# Patient Record
Sex: Female | Born: 1990 | Race: Black or African American | Hispanic: No | Marital: Single | Smoking: Current every day smoker
Health system: Southern US, Community
[De-identification: ages and names within clinical notes are randomized; demographics above are authoritative.]

## PROBLEM LIST (undated history)

## (undated) ENCOUNTER — Inpatient Hospital Stay (HOSPITAL_COMMUNITY): Payer: Self-pay

## (undated) DIAGNOSIS — O009 Unspecified ectopic pregnancy without intrauterine pregnancy: Secondary | ICD-10-CM

## (undated) DIAGNOSIS — N390 Urinary tract infection, site not specified: Secondary | ICD-10-CM

## (undated) DIAGNOSIS — R87610 Atypical squamous cells of undetermined significance on cytologic smear of cervix (ASC-US): Secondary | ICD-10-CM

## (undated) DIAGNOSIS — E669 Obesity, unspecified: Secondary | ICD-10-CM

## (undated) DIAGNOSIS — R87810 Cervical high risk human papillomavirus (HPV) DNA test positive: Secondary | ICD-10-CM

## (undated) DIAGNOSIS — A749 Chlamydial infection, unspecified: Secondary | ICD-10-CM

## (undated) HISTORY — PX: ECTOPIC PREGNANCY SURGERY: SHX613

## (undated) HISTORY — PX: LAPAROSCOPIC UNILATERAL SALPINGECTOMY: SHX5934

## (undated) HISTORY — DX: Unspecified ectopic pregnancy without intrauterine pregnancy: O00.90

## (undated) HISTORY — DX: Cervical high risk human papillomavirus (HPV) DNA test positive: R87.810

## (undated) HISTORY — DX: Atypical squamous cells of undetermined significance on cytologic smear of cervix (ASC-US): R87.610

---
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
## 2011-02-15 ENCOUNTER — Emergency Department (HOSPITAL_COMMUNITY)
Admission: EM | Admit: 2011-02-15 | Discharge: 2011-02-15 | Disposition: A | Discharge status: Home / Self Care | Payer: Self-pay | Attending: Emergency Medicine | Admitting: Emergency Medicine

## 2011-02-15 DIAGNOSIS — J3489 Other specified disorders of nose and nasal sinuses: Secondary | ICD-10-CM | POA: Insufficient documentation

## 2011-02-15 DIAGNOSIS — J069 Acute upper respiratory infection, unspecified: Secondary | ICD-10-CM | POA: Insufficient documentation

## 2011-02-15 DIAGNOSIS — N390 Urinary tract infection, site not specified: Secondary | ICD-10-CM | POA: Insufficient documentation

## 2011-02-15 DIAGNOSIS — R079 Chest pain, unspecified: Secondary | ICD-10-CM | POA: Insufficient documentation

## 2011-02-15 DIAGNOSIS — R1013 Epigastric pain: Secondary | ICD-10-CM | POA: Insufficient documentation

## 2011-02-15 DIAGNOSIS — R059 Cough, unspecified: Secondary | ICD-10-CM | POA: Insufficient documentation

## 2011-02-15 DIAGNOSIS — F341 Dysthymic disorder: Secondary | ICD-10-CM | POA: Insufficient documentation

## 2011-02-15 DIAGNOSIS — R110 Nausea: Secondary | ICD-10-CM | POA: Insufficient documentation

## 2011-02-15 DIAGNOSIS — R05 Cough: Secondary | ICD-10-CM | POA: Insufficient documentation

## 2011-02-15 DIAGNOSIS — K219 Gastro-esophageal reflux disease without esophagitis: Secondary | ICD-10-CM | POA: Insufficient documentation

## 2011-02-15 LAB — URINALYSIS, ROUTINE W REFLEX MICROSCOPIC
Glucose, UA: NEGATIVE mg/dL
Protein, ur: NEGATIVE mg/dL
Specific Gravity, Urine: 1.018 (ref 1.005–1.030)

## 2011-02-15 LAB — URINE MICROSCOPIC-ADD ON

## 2011-07-10 ENCOUNTER — Encounter (HOSPITAL_COMMUNITY): Payer: Self-pay | Admitting: Emergency Medicine

## 2011-07-10 ENCOUNTER — Emergency Department (INDEPENDENT_AMBULATORY_CARE_PROVIDER_SITE_OTHER)
Admission: EM | Admit: 2011-07-10 | Discharge: 2011-07-10 | Disposition: A | Discharge status: Home / Self Care | Payer: Medicaid Other | Source: Home / Self Care

## 2011-07-10 DIAGNOSIS — N390 Urinary tract infection, site not specified: Secondary | ICD-10-CM

## 2011-07-10 LAB — POCT URINALYSIS DIP (DEVICE)
Protein, ur: 100 mg/dL — AB
Specific Gravity, Urine: >=1.03 (ref 1.005–1.030)
Urobilinogen, UA: 0.2 mg/dL (ref 0.0–1.0)
pH: 5.5 (ref 5.0–8.0)

## 2011-07-10 MED ORDER — CEPHALEXIN 500 MG PO CAPS: 500.0000 mg | ORAL_CAPSULE | Freq: Two times a day (BID) | ORAL | Status: AC

## 2011-07-10 NOTE — ED Notes (Signed)
HERE WITH URINE FREQ/URGE,PRESSURE AND BLOOD TINGED URINE WITH WIPING X 1 WEEK.DENIES VAG D/C OR PAIN.NO FEVERS/N/V REPORTED WITH SX.LMP 06/21/11

## 2011-07-10 NOTE — ED Provider Notes (Signed)
History     CSN: [id number]  Arrival date & time 07/10/11  1478   None     Chief Complaint  Patient presents with  . Urinary Tract Infection    (Consider location/radiation/quality/duration/timing/severity/associated sxs/prior treatment) HPI Comments: Onset one week ago of urinary urgency, frequency, retention, and dysuria. She is beginning to notice blood tinges on the tissue with wiping after voiding. No fever, chills, abdominal or back pain. No previous hx of UTI.    History reviewed. No pertinent past medical history.  History reviewed. No pertinent past surgical history.  History reviewed. No pertinent family history.  History  Substance Use Topics  . Smoking status: Current Everyday Smoker  . Smokeless tobacco: Not on file  . Alcohol Use: No    OB History    Grav Para Term Preterm Abortions TAB SAB Ect Mult Living                  Review of Systems  Constitutional: Negative for fever and chills.  Gastrointestinal: Positive for abdominal pain.  Genitourinary: Positive for dysuria, urgency and frequency. Negative for flank pain, vaginal discharge and pelvic pain.  Musculoskeletal: Negative for back pain.    Allergies  Review of patient's allergies indicates no known allergies.  Home Medications   Current Outpatient Rx  Name Route Sig Dispense Refill  . CEPHALEXIN 500 MG PO CAPS Oral Take 1 capsule (500 mg total) by mouth 2 (two) times daily. 14 capsule 0    BP 119/86  Pulse 71  Temp(Src) 98.4 F (36.9 C) (Oral)  Resp 16  SpO2 97%  LMP 06/21/2011  Physical Exam  Nursing note and vitals reviewed. Constitutional: She appears well-developed and well-nourished. No distress.  HENT:  Head: Normocephalic and atraumatic.  Cardiovascular: Normal rate, regular rhythm and normal heart sounds.   Pulmonary/Chest: Effort normal and breath sounds normal.  Abdominal: Soft. Bowel sounds are normal. There is tenderness (mild) in the suprapubic area. There is no  guarding and no CVA tenderness.  Neurological: She is alert.  Skin: Skin is warm and dry.  Psychiatric: She has a normal mood and affect.    ED Course  Procedures (including critical care time)  Labs Reviewed  POCT URINALYSIS DIP (DEVICE) - Abnormal; Notable for the following:    Ketones, ur TRACE (*)    Hgb urine dipstick MODERATE (*)    Protein, ur 100 (*)    Nitrite POSITIVE (*)    Leukocytes, UA SMALL (*) Biochemical Testing Only. Please order routine urinalysis from main lab if confirmatory testing is needed.   All other components within normal limits  POCT PREGNANCY, URINE   No results found.   1. UTI (lower urinary tract infection)       MDM  Urine dip pos.         Melody Comas, [place] 07/10/11 1123

## 2011-07-17 NOTE — ED Provider Notes (Signed)
Medical screening examination/treatment/procedure(s) were performed by non-physician practitioner and as supervising physician I was immediately available for consultation/collaboration.  Leilyn Frayre G  D.O.    Randa Spike, MD 07/17/11 [phone]

## 2011-08-22 ENCOUNTER — Encounter (HOSPITAL_COMMUNITY): Payer: Self-pay | Admitting: *Deleted

## 2011-08-22 ENCOUNTER — Emergency Department (HOSPITAL_COMMUNITY)
Admission: EM | Admit: 2011-08-22 | Discharge: 2011-08-22 | Disposition: A | Discharge status: Home / Self Care | Payer: Self-pay | Source: Home / Self Care

## 2011-08-22 ENCOUNTER — Inpatient Hospital Stay (HOSPITAL_COMMUNITY)
Admission: AD | Admit: 2011-08-22 | Discharge: 2011-08-23 | Disposition: A | Discharge status: Home / Self Care | Payer: Self-pay | Source: Ambulatory Visit | Attending: Obstetrics & Gynecology | Admitting: Obstetrics & Gynecology

## 2011-08-22 DIAGNOSIS — R102 Pelvic and perineal pain: Secondary | ICD-10-CM

## 2011-08-22 DIAGNOSIS — N949 Unspecified condition associated with female genital organs and menstrual cycle: Secondary | ICD-10-CM

## 2011-08-22 DIAGNOSIS — O99891 Other specified diseases and conditions complicating pregnancy: Secondary | ICD-10-CM | POA: Insufficient documentation

## 2011-08-22 DIAGNOSIS — Z331 Pregnant state, incidental: Secondary | ICD-10-CM

## 2011-08-22 DIAGNOSIS — R109 Unspecified abdominal pain: Secondary | ICD-10-CM | POA: Insufficient documentation

## 2011-08-22 HISTORY — DX: Urinary tract infection, site not specified: N39.0

## 2011-08-22 LAB — POCT URINALYSIS DIP (DEVICE)
Glucose, UA: NEGATIVE mg/dL
Ketones, ur: NEGATIVE mg/dL
Protein, ur: NEGATIVE mg/dL

## 2011-08-22 LAB — CBC
HCT: 39.1 % (ref 36.0–46.0)
Hemoglobin: 12.8 g/dL (ref 12.0–15.0)
MCHC: 32.7 g/dL (ref 30.0–36.0)
MCV: 91.6 fL (ref 78.0–100.0)
WBC: 11.8 10*3/uL — ABNORMAL HIGH (ref 4.0–10.5)

## 2011-08-22 LAB — WET PREP, GENITAL
Trich, Wet Prep: NONE SEEN
Yeast Wet Prep HPF POC: NONE SEEN

## 2011-08-22 LAB — POCT PREGNANCY, URINE: Preg Test, Ur: POSITIVE — AB

## 2011-08-22 LAB — HCG, QUANTITATIVE, PREGNANCY: hCG, Beta Chain, Quant, S: 217 m[IU]/mL — ABNORMAL HIGH (ref <5)

## 2011-08-22 MED ORDER — METHYLPREDNISOLONE ACETATE 40 MG/ML IJ SUSP
INTRAMUSCULAR | Status: AC
  Filled 2011-08-22: qty 10

## 2011-08-22 NOTE — ED Provider Notes (Signed)
History     CSN: [id number]  Arrival date & time 08/22/11  1027   None     Chief Complaint  Patient presents with  . Abdominal Pain  . Vaginal Itching    (Consider location/radiation/quality/duration/timing/severity/associated sxs/prior treatment) HPI Comments: Patient presents today with complaints of intermittent pelvic pain for the last one week. She states the pain is admixture of crampy sharp discomfort and when occurs last for up to 5 minutes. She is approximately one week late for her menstrual period. She has had no vaginal bleeding or spotting. She denies any vaginal discharge with complaints of vaginal irritation. No current dysuria or urinary frequency. She was treated approximately one month ago for UTI. She has had 2 home pregnancy tests recently which were negative. She admits that she is trying to conceive.   Past Medical History  Diagnosis Date  . UTI (urinary tract infection)     History reviewed. No pertinent past surgical history.  History reviewed. No pertinent family history.  History  Substance Use Topics  . Smoking status: Current Everyday Smoker -- 1.0 packs/day  . Smokeless tobacco: Not on file  . Alcohol Use: No    OB History    Grav Para Term Preterm Abortions TAB SAB Ect Mult Living                  Review of Systems  Constitutional: Negative for fever and chills.  Gastrointestinal: Positive for nausea. Negative for vomiting and diarrhea.  Genitourinary: Positive for pelvic pain. Negative for dysuria, frequency and decreased urine volume.    Allergies  Other  Home Medications  No current outpatient prescriptions on file.  BP 114/64  Pulse 73  Temp(Src) 98.9 F (37.2 C) (Oral)  Resp 18  SpO2 100%  LMP 07/19/2011  Physical Exam  Nursing note and vitals reviewed. Constitutional: She appears well-developed and well-nourished. No distress.  Cardiovascular: Normal rate, regular rhythm and normal heart sounds.   Pulmonary/Chest:  Effort normal and breath sounds normal. No respiratory distress.  Abdominal: Soft. Bowel sounds are normal. She exhibits no distension and no mass. There is tenderness in the right lower quadrant and suprapubic area. There is no rigidity, no rebound and no guarding.  Genitourinary: Vagina normal and uterus normal. There is no rash, tenderness, lesion or injury on the right labia. There is no rash, tenderness, lesion or injury on the left labia. Cervix exhibits no motion tenderness, no discharge and no friability. Right adnexum displays tenderness. Right adnexum displays no mass and no fullness. Left adnexum displays no mass, no tenderness and no fullness.  Neurological: She is alert.  Skin: Skin is warm and dry.  Psychiatric: She has a normal mood and affect.    ED Course  Procedures (including critical care time)  Labs Reviewed  POCT URINALYSIS DIP (DEVICE) - Abnormal; Notable for the following:    Urobilinogen, UA 2.0 (*)    Leukocytes, UA LARGE (*) Biochemical Testing Only. Please order routine urinalysis from main lab if confirmatory testing is needed.   All other components within normal limits  GC/CHLAMYDIA PROBE AMP, GENITAL  WET PREP, GENITAL   No results found.   1. Pelvic pain   2. Pregnancy as incidental finding       MDM  Pt to go directly to [facility] ED for PUS - Rt adnexal pain w/ pos preg test.         Melody Comas, PA 08/22/11 2116

## 2011-08-22 NOTE — ED Notes (Signed)
Junious Dresser [facility] MAU RN, notified of pt hx.

## 2011-08-22 NOTE — MAU Note (Signed)
Pt states, " I have been having crampy pain on my left side then it would go away since March 10 th. I was seen at [facility] Urgent care and sent here for an [place]."

## 2011-08-22 NOTE — ED Provider Notes (Signed)
Medical screening examination/treatment/procedure(s) were performed by non-physician practitioner and as supervising physician I was immediately available for consultation/collaboration.  Leslee Home, M.D.   Reuben Likes, MD 08/22/11 2219

## 2011-08-22 NOTE — ED Notes (Addendum)
C/O pain across low abdominal pain and irritation in vaginal area without vaginal discharge.  Also c/o slight nausea and "feeling sleepy".  LMP 07/19/11 - states took home preg test with negative results.  Was treated approx 1 month ago for UTI - states sxs resolved.

## 2011-08-22 NOTE — Discharge Instructions (Signed)
Your urine pregnancy test was positive. Because you are having pelvic pain it is very important that you have a pelvic ultrasound this evening to make sure that you are not having complications related to the pregnancy, such as a tubal pregnancy. This can be life threatening. Go directly to [facility] ER for further evaluation. They have been notified and are expecting your arrival.

## 2011-08-23 ENCOUNTER — Encounter (HOSPITAL_COMMUNITY): Payer: Self-pay | Admitting: Radiology

## 2011-08-23 ENCOUNTER — Inpatient Hospital Stay (HOSPITAL_COMMUNITY): Payer: Self-pay

## 2011-08-23 NOTE — MAU Note (Signed)
Dr Adrian Blackwater in triage room with pt explaining results of BHCG and [place].

## 2011-08-23 NOTE — MAU Provider Note (Signed)
History     CSN: [id number]  Arrival date and time: 08/22/11 2139   None     Chief Complaint  Patient presents with  . Abdominal Pain  . Amenorrhea   HPI This is a 21 year old G3 P0 020 at 3 weeks and 4 days EGA and based on LMP of 07/29/2011 who was referred to the MAU from urgency care due to vague pelvic complaint. Her pelvic discomfort has been intermittent over the past 3 days. She had a positive urine pregnancy test at urgent care. She denies sharp pelvic pain, fevers, chills, nausea.    OB History    Grav Para Term Preterm Abortions TAB SAB Ect Mult Living   2               Past Medical History  Diagnosis Date  . UTI (urinary tract infection)   . Asthma   . No pertinent past medical history     Past Surgical History  Procedure Date  . No past surgeries     Family History  Problem Relation Age of Onset  . Anesthesia problems Neg Hx   . Hypotension Neg Hx   . Malignant hyperthermia Neg Hx   . Pseudochol deficiency Neg Hx     History  Substance Use Topics  . Smoking status: Current Everyday Smoker -- 1.0 packs/day  . Smokeless tobacco: Not on file  . Alcohol Use: No    Allergies:  Allergies  Allergen Reactions  . Other     Seafood    No prescriptions prior to admission    ROS Physical Exam   Blood pressure 120/67, pulse 86, temperature 99 F (37.2 C), temperature source Oral, resp. rate 18, height 5\' 5"  (1.651 m), weight 83.689 kg (184 lb 8 oz), last menstrual period 07/19/2011.  Physical Exam  Constitutional: She is oriented to person, place, and time. She appears well-developed and well-nourished.  Neurological: She is alert and oriented to person, place, and time.  Psychiatric: She has a normal mood and affect. Her behavior is normal. Judgment and thought content normal.   Results for orders placed during the hospital encounter of 08/22/11 (from the past 24 hour(s))  HCG, QUANTITATIVE, PREGNANCY     Status: Abnormal   Collection Time     08/22/11 10:10 PM      Component Value Range   hCG, Beta Chain, Quant, S 217 (*) <5 (mIU/mL)  CBC     Status: Abnormal   Collection Time   08/22/11 10:10 PM      Component Value Range   WBC 11.8 (*) 4.0 - 10.5 (K/uL)   RBC 4.27  3.87 - 5.11 (MIL/uL)   Hemoglobin 12.8  12.0 - 15.0 (g/dL)   HCT 40.9  81.1 - 91.4 (%)   MCV 91.6  78.0 - 100.0 (fL)   MCH 30.0  26.0 - 34.0 (pg)   MCHC 32.7  30.0 - 36.0 (g/dL)   RDW 78.2  95.6 - 21.3 (%)   Platelets 308  150 - 400 (K/uL)   Ultrasound shows no evidence of gestational sac or yolk sac either the uterus or the fallopian tubes.  MAU Course  Procedures   Assessment and Plan  #1 early intrauterine pregnancy  With beta-hCG level of 200, it may be truly to see gestational sac. Patient was counseled return in 2 days for repeat Quant. Patient was counseled with signs and symptoms of miscarriage and ectopic pregnancy and encourage her to return if either of these things this  happened.  Ernesha Ramone JEHIEL 08/23/2011, 1:33 AM

## 2011-08-24 LAB — URINE CULTURE

## 2011-08-25 ENCOUNTER — Encounter (HOSPITAL_COMMUNITY): Payer: Self-pay

## 2011-08-25 ENCOUNTER — Inpatient Hospital Stay (HOSPITAL_COMMUNITY)
Admission: AD | Admit: 2011-08-25 | Discharge: 2011-08-25 | Disposition: A | Discharge status: Home / Self Care | Payer: Medicaid Other | Source: Ambulatory Visit | Attending: Obstetrics & Gynecology | Admitting: Obstetrics & Gynecology

## 2011-08-25 DIAGNOSIS — O039 Complete or unspecified spontaneous abortion without complication: Secondary | ICD-10-CM | POA: Insufficient documentation

## 2011-08-25 LAB — ABO/RH: ABO/RH(D): A NEG

## 2011-08-25 MED ORDER — RHO D IMMUNE GLOBULIN 1500 UNIT/2ML IJ SOLN
300.0000 ug | Freq: Once | INTRAMUSCULAR | Status: AC
  Administered 2011-08-25: 300 ug via INTRAMUSCULAR

## 2011-08-25 NOTE — MAU Note (Signed)
Patient to MAU for follow up BHCG. Patient denies any pain but did have light brown spotting.

## 2011-08-25 NOTE — MAU Provider Note (Signed)
  History     CSN: [id number]  Arrival date and time: 08/25/11 1478   None     No chief complaint on file.  HPI 21 y.o. G2P0 at [redacted]w[redacted]d here for f/u quant. Presented on 3/15 with pain, no pain today. Does report brown spotting yesterday, none today. Quant HCG 212 on 3/15, no IUP or adnexal mass on u/s.    Past Medical History  Diagnosis Date  . UTI (urinary tract infection)   . Asthma   . No pertinent past medical history     Past Surgical History  Procedure Date  . No past surgeries     Family History  Problem Relation Age of Onset  . Anesthesia problems Neg Hx   . Hypotension Neg Hx   . Malignant hyperthermia Neg Hx   . Pseudochol deficiency Neg Hx     History  Substance Use Topics  . Smoking status: Current Everyday Smoker -- 1.0 packs/day  . Smokeless tobacco: Not on file  . Alcohol Use: No    Allergies:  Allergies  Allergen Reactions  . Other     Seafood    No prescriptions prior to admission    Review of Systems  Constitutional: Negative.   Respiratory: Negative.   Cardiovascular: Negative.   Gastrointestinal: Negative for nausea, vomiting, abdominal pain, diarrhea and constipation.  Genitourinary: Negative for dysuria, urgency, frequency, hematuria and flank pain.       Negative for vaginal bleeding, vaginal discharge  Musculoskeletal: Negative.   Neurological: Negative.   Psychiatric/Behavioral: Negative.    Physical Exam   Last menstrual period 07/19/2011.  Physical Exam  Nursing note and vitals reviewed. Constitutional: She is oriented to person, place, and time. She appears well-developed and well-nourished. No distress.  Cardiovascular: Normal rate.   Respiratory: Effort normal.  Musculoskeletal: Normal range of motion.  Neurological: She is alert and oriented to person, place, and time.  Skin: Skin is warm and dry.  Psychiatric: She has a normal mood and affect.    MAU Course  Procedures Results for orders placed during the  hospital encounter of 08/25/11 (from the past 24 hour(s))  HCG, QUANTITATIVE, PREGNANCY     Status: Abnormal   Collection Time   08/25/11  8:46 AM      Component Value Range   hCG, Beta Chain, Quant, S 162 (*) <5 (mIU/mL)  ABO/RH     Status: Normal   Collection Time   08/25/11  9:29 AM      Component Value Range   ABO/RH(D) A NEG    RH IG WORKUP     Status: Normal (Preliminary result)   Collection Time   08/25/11 10:44 AM      Component Value Range   Gestational Age(Wks) 5     ABO/RH(D) A NEG     Antibody Screen NEG     Unit Number [id number]     Blood Component Type RHIG     Unit division 00     Status of Unit ISSUED     Transfusion Status OK TO TRANSFUSE       Assessment and Plan  21 y.o. G2P0 at [redacted]w[redacted]d with decreasing quant HCG, likely SAB Rhogam given today Return to MAU 1 week for repeat quant  Sheehan Stacey 08/25/2011, 8:44 AM

## 2011-08-25 NOTE — Discharge Instructions (Signed)
Miscarriage (Spontaneous Miscarriage)  A miscarriage is when you lose your baby before the twentieth week of pregnancy. Miscarriages happen in 15-20% of pregnancies. Most miscarriages happen in the first 13 weeks of the pregnancy. In medical terms, this is called a spontaneous miscarriage or early pregnancy loss. No further treatment is needed when the miscarriage is complete and all products of conception have been passed out of the body. You can begin trying for another pregnancy as soon as your caregiver says it is okay.  CAUSES    Most causes are not known.   Genetic problems like abnormal, not enough or too many chromosomes.   Infection of the cervix or uterus.   An abnormal shaped uterus, fibroid tumors or congenital abnormalities.   Hormone problems.   Medical problems.   Incompetent cervix, the tissue in the cervix is not strong enough to hold the pregnancy.   Smoking, too much alcohol use and illegal drugs.   Trauma.  SYMPTOMS    Bleeding or spotting from the vagina.   Cramping of the lower abdomen.   Passing of fluid from the vagina with or without cramps or pain.   Passing fetal tissue.  TREATMENT    Sometimes no further treatment is necessary if you pass all the tissue in the uterus.   If partial parts of the fetus or placenta remain in the body (incomplete miscarriage), tissue left behind may become infected. Usually a D and C (Dilatation and Curettage) suction or scrapping of the uterus is necessary to remove the remaining tissue in uterus. The procedure is only done when your caregiver knows that there is no chance for the pregnancy to continue. This is determined by a physical exam, a negative pregnancy test, blood tests and perhaps an ultrasound revealing a dead fetus or no fetus developing because a problem occurred at conception (when the sperm and egg unite).   Medications may be necessary, antibiotics if there is an infection or medications to contract the uterus if there is a  lot of bleeding.   If you have Rh negative blood and your partner is Rh positive, you will need a Rho-gam shot (an immune globulin vaccine). This will protect your baby from having Rh blood problems in future pregnancies.  HOME CARE INSTRUCTIONS    Your caregiver may order bed rest (up to the bathroom only). He or she may allow you to continue light activity. You may need to make arrangements for the care of children and for any other responsibilities.   Keep track of the number of pads you use each day and how soaked (saturated) they are. Record this information.   Do not use tampons. Do not douche or have sexual intercourse until approved by your caregiver.   Only take over-the-counter or prescription medicines for pain, discomfort or fever as directed by your caregiver.   Do not take aspirin because it can cause bleeding.   It is very important to keep all follow-up appointments for re-evaluations and continuing management.   Tell your caregiver if you are experiencing domestic violence.   Women who have an Rh negative blood type (i.e., A, B, AB, or O negative) need to receive a drug called Rh(D) immune globulin (RhoGam). This medicine helps protect future fetuses against problems that can occur if an Rh negative mother is carrying a baby who is Rh positive.   If you and/or your partner are having problems with guilt or grieving, talk to your caregiver or seek counseling to help   you cope with the pregnancy loss. Allow enough time to grieve before trying to get pregnant again.  SEEK IMMEDIATE MEDICAL CARE IF:    You have severe cramps or pain in your stomach, back, or belly (abdomen).   You have a fever.   You pass large clots or tissue. Save any tissue for your caregiver to inspect.   Your bleeding increases.   You become light-headed, weak or have fainting episodes.   You develop chills.  Document Released: 11/19/2000 Document Revised: 05/15/2011 Document Reviewed: 12/27/2007  [employer] Patient  Information 2012 [employer].

## 2011-08-25 NOTE — ED Notes (Addendum)
GC/Chlamydia neg., Wet prep: few clue cells, mod. WBC's, Urine culture: >100,000 colonies Lactobacillus species. Lab shown to [employer] PA and she wrote -contaminant no treatment needed. Vassie Moselle 08/25/2011

## 2011-08-26 LAB — RH IG WORKUP (INCLUDES ABO/RH)
Antibody Screen: NEGATIVE
Gestational Age(Wks): 5

## 2011-09-03 ENCOUNTER — Inpatient Hospital Stay (HOSPITAL_COMMUNITY): Payer: Medicaid Other

## 2011-09-03 ENCOUNTER — Encounter (HOSPITAL_COMMUNITY): Payer: Self-pay | Admitting: *Deleted

## 2011-09-03 ENCOUNTER — Inpatient Hospital Stay (HOSPITAL_COMMUNITY)
Admission: AD | Admit: 2011-09-03 | Discharge: 2011-09-03 | Disposition: A | Discharge status: Home / Self Care | Payer: Medicaid Other | Source: Ambulatory Visit | Attending: Family Medicine | Admitting: Family Medicine

## 2011-09-03 DIAGNOSIS — O209 Hemorrhage in early pregnancy, unspecified: Secondary | ICD-10-CM | POA: Insufficient documentation

## 2011-09-03 NOTE — MAU Note (Signed)
Pt states, " I am here for. I'm not bleeding or having pain now."

## 2011-09-03 NOTE — MAU Note (Signed)
Terri Burrelson RNP in with pt to discuss labs and [place]

## 2011-09-03 NOTE — MAU Provider Note (Signed)
  History     CSN: [id number]  Arrival date and time: 09/03/11 1322   First Provider Initiated Contact with Patient 09/03/11 1516      Chief Complaint  Patient presents with  . Follow-up   HPI Cheyenne Walton 21 y.o. 6w 4d by LMP. Here for repeat quant.  Was seen on 08-22-11 and quant was 212.  Returned on 08-25-11 and quant was 162. Had rhogam on 08-25-11.  Was told this was likely a SAB and was to return in one week for quant.   OB History    Grav Para Term Preterm Abortions TAB SAB Ect Mult Living   1 0 0 0 0 0 0 0 0 0       Past Medical History  Diagnosis Date  . UTI (urinary tract infection)   . Asthma   . No pertinent past medical history     Past Surgical History  Procedure Date  . No past surgeries     Family History  Problem Relation Age of Onset  . Anesthesia problems Neg Hx   . Hypotension Neg Hx   . Malignant hyperthermia Neg Hx   . Pseudochol deficiency Neg Hx     History  Substance Use Topics  . Smoking status: Current Everyday Smoker -- 0.5 packs/day  . Smokeless tobacco: Never Used  . Alcohol Use: No    Allergies:  Allergies  Allergen Reactions  . Other     Seafood    No prescriptions prior to admission    Review of Systems  Gastrointestinal: Negative for abdominal pain.  Genitourinary:       No vaginal bleeding   Physical Exam   Blood pressure 119/68, pulse 100, temperature 99.9 F (37.7 C), temperature source Oral, resp. rate 20, height 5\' 5"  (1.651 m), weight 189 lb 2 oz (85.787 kg), last menstrual period 07/19/2011.  Physical Exam  Nursing note and vitals reviewed. Constitutional: She is oriented to person, place, and time. She appears well-developed and well-nourished.  HENT:  Head: Normocephalic.  Eyes: EOM are normal.  Neck: Neck supple.  Musculoskeletal: Normal range of motion.  Neurological: She is alert and oriented to person, place, and time.  Skin: Skin is warm and dry.  Psychiatric: She has a normal mood and  affect.    MAU Course  Procedures Results for orders placed during the hospital encounter of 09/03/11 (from the past 24 hour(s))  HCG, QUANTITATIVE, PREGNANCY     Status: Abnormal   Collection Time   09/03/11  2:10 PM      Component Value Range   hCG, Beta Chain, Quant, S 1180 (*) <5 (mIU/mL)   MDM  Consult with Dr. Shawnie Pons - reviewed plan of care. Will get ultrasound today as quant has increased.   Assessment and Plan  Rising quant. - suspect abnormal rise.  Plan Consult ultrasound results with Dr. Shawnie Pons and reviewed plan of care Reviewed ectopic precautions with client To return on Friday at 12 noon for repeat quant Return sooner for any abdominal pain or vaginal bleeding  Julius Boniface 09/03/2011, 4:43 PM

## 2011-09-04 NOTE — MAU Provider Note (Signed)
Chart reviewed and agree with management and plan.  

## 2011-09-05 ENCOUNTER — Inpatient Hospital Stay (HOSPITAL_COMMUNITY)
Admission: AD | Admit: 2011-09-05 | Discharge: 2011-09-05 | Disposition: A | Discharge status: Home / Self Care | Payer: Medicaid Other | Source: Ambulatory Visit | Attending: Obstetrics & Gynecology | Admitting: Obstetrics & Gynecology

## 2011-09-05 DIAGNOSIS — Z3200 Encounter for pregnancy test, result unknown: Secondary | ICD-10-CM

## 2011-09-05 DIAGNOSIS — O99891 Other specified diseases and conditions complicating pregnancy: Secondary | ICD-10-CM | POA: Insufficient documentation

## 2011-09-05 NOTE — Discharge Instructions (Signed)
Human Chorionic Gonadotropin (hCG) This is a test to confirm and monitor pregnancy or to diagnose trophoblastic disease or germ cell tumors. As early as 10 days after a missed menstrual period (some methods can detect hCG even earlier, at one week after conception) or if your caregiver thinks that your symptoms suggest ectopic pregnancy, a failing pregnancy, trophoblastic disease, or germ cell tumors. hCG is a protein produced in the placenta of a pregnant woman. A pregnancy test is a specific blood or urine test that can detect hCG and confirm pregnancy. This hormone is able to be detected 10 days after a missed menstrual period, the time period when the fertilized egg is implanted in the woman's uterus. With some methods, hCG can be detected even earlier, at one week after conception.  During the early weeks of pregnancy, hCG is important in maintaining function of the corpus luteum (the mass of cells that forms from a mature egg). Production of hCG increases steadily during the first trimester (8-10 weeks), peaking around the 10th week after the last menstrual cycle. Levels then fall slowly during the remainder of the pregnancy. hCG is no longer detectable within a few weeks after delivery. hCG is also produced by some germ cell tumors and increased levels are seen in trophoblastic disease. SAMPLE COLLECTION hCG is commonly detected in urine. The preferred specimen is a random urine sample collected first thing in the morning. hCG can also be measured in blood drawn from a vein in the arm. NORMAL FINDINGS Qualitative:negative in non-pregnant women; positive in pregnancy Quantitative:   Gestation less than 1 week: 5-50 Whole HCG (milli-international units/mL)   Gestation of 2 weeks: 50-500 Whole HCG (milli-international units/mL)   Gestation of 3 weeks: 100-10,000 Whole HCG (milli-international units/mL)   Gestation of 4 weeks: 1,000-30,000 Whole HCG (milli-international units/mL)   Gestation of  5 weeks 3,500-115,000 Whole HCG (milli-international units/mL)   Gestation of 6-8 weeks: 12,000-270,000 Whole HCG (milli-international units/mL)   Gestation of 12 weeks: 15,000-220,000 Whole HCG (milli-international units/mL)   Males and non-pregnant females: less than 5 Whole HCG (milli-international units/mL)  Beta subunit: depends on the method and test used Ranges for normal findings may vary among different laboratories and hospitals. You should always check with your doctor after having lab work or other tests done to discuss the meaning of your test results and whether your values are considered within normal limits. MEANING OF TEST  Your caregiver will go over the test results with you and discuss the importance and meaning of your results, as well as treatment options and the need for additional tests if necessary. OBTAINING THE TEST RESULTS It is your responsibility to obtain your test results. Ask the lab or department performing the test when and how you will get your results. Document Released: 06/27/2004 Document Revised: 05/15/2011 Document Reviewed: 05/06/2008 [facility] Patient Information 2012 [place], [place].

## 2011-09-05 NOTE — ED Notes (Signed)
Patient here for repeat BHCG having no pain

## 2011-09-05 NOTE — MAU Provider Note (Signed)
History   Chief Complaint:  No chief complaint on file.   Cheyenne Walton is  21 y.o. G1P0000 Patient's last menstrual period was 07/19/2011.Marland Kitchen Patient is here for follow up of quantitative HCG and ongoing surveillance of pregnancy status.     Since her last visit, the patient is without new complaint.   The patient reports bleeding as  none now.    General ROS:  negative  Her previous Quantitative HCG values are: 3/15: 212, 3/18: 162, 3/27: 1180    Physical Exam   Blood pressure 107/69, pulse 79, temperature 98.8 F (37.1 C), temperature source Oral, resp. rate 20, last menstrual period 07/19/2011.  Focused Gynecological Exam: examination not indicated  Labs: Recent Results (from the past 24 hour(s))  HCG, QUANTITATIVE, PREGNANCY   Collection Time   09/05/11 12:35 PM      Component Value Range   hCG, Beta Chain, Quant, S 1618 (*) <5 (mIU/mL)     Assessment: Positive Pregnancy test with 73% rise in 48 hours  Plan: Will continue to follow quants closely FU in MAU on Sunday for repeat quant, plan FU ultrasound in 1 week if continued adequate rise Precautions reviewed  Dmari Schubring E. 09/05/2011, 3:06 PM

## 2011-09-07 ENCOUNTER — Inpatient Hospital Stay (HOSPITAL_COMMUNITY): Payer: Medicaid Other

## 2011-09-07 ENCOUNTER — Inpatient Hospital Stay (HOSPITAL_COMMUNITY)
Admission: AD | Admit: 2011-09-07 | Discharge: 2011-09-07 | Disposition: A | Discharge status: Home / Self Care | Payer: Medicaid Other | Source: Ambulatory Visit | Attending: Obstetrics & Gynecology | Admitting: Obstetrics & Gynecology

## 2011-09-07 ENCOUNTER — Inpatient Hospital Stay (HOSPITAL_COMMUNITY): Admission: RE | Admit: 2011-09-07 | Payer: Self-pay | Source: Ambulatory Visit

## 2011-09-07 DIAGNOSIS — O00109 Unspecified tubal pregnancy without intrauterine pregnancy: Secondary | ICD-10-CM | POA: Insufficient documentation

## 2011-09-07 DIAGNOSIS — O009 Unspecified ectopic pregnancy without intrauterine pregnancy: Secondary | ICD-10-CM

## 2011-09-07 LAB — CBC
HCT: 37.6 % (ref 36.0–46.0)
Hemoglobin: 12.5 g/dL (ref 12.0–15.0)
MCH: 30.9 pg (ref 26.0–34.0)
MCV: 92.8 fL (ref 78.0–100.0)
RBC: 4.05 MIL/uL (ref 3.87–5.11)

## 2011-09-07 LAB — CREATININE, SERUM
GFR calc Af Amer: >90 mL/min (ref >90)
GFR calc non Af Amer: >90 mL/min (ref >90)

## 2011-09-07 LAB — DIFFERENTIAL
Eosinophils Absolute: 0.4 10*3/uL (ref 0.0–0.7)
Eosinophils Relative: 5 % (ref 0–5)
Lymphocytes Relative: 28 % (ref 12–46)
Lymphs Abs: 2.2 10*3/uL (ref 0.7–4.0)
Monocytes Absolute: 0.6 10*3/uL (ref 0.1–1.0)
Monocytes Relative: 8 % (ref 3–12)

## 2011-09-07 LAB — HCG, QUANTITATIVE, PREGNANCY: hCG, Beta Chain, Quant, S: 1939 m[IU]/mL — ABNORMAL HIGH (ref <5)

## 2011-09-07 MED ORDER — METHOTREXATE INJECTION FOR WOMEN'S HOSPITAL
50.0000 mg/m2 | Freq: Once | INTRAMUSCULAR | Status: AC
  Administered 2011-09-07: 100 mg via INTRAMUSCULAR
  Filled 2011-09-07: qty 2

## 2011-09-07 NOTE — MAU Provider Note (Signed)
History     CSN: [id number]  Arrival date and time: 09/07/11 0943   None     Chief Complaint  Patient presents with  . Follow-up   HPI Cheyenne Walton 21 y.o. 7w 1d by LMP returns for BHCG followup.  Inadequately rising quants.  Previous quants = 3/15: 212, 3/18: 162, 3/27: 1180, 3/29: 1618.  No pain.  No bleeding.   OB History    Grav Para Term Preterm Abortions TAB SAB Ect Mult Living   1 0 0 0 0 0 0 0 0 0       Past Medical History  Diagnosis Date  . UTI (urinary tract infection)   . Asthma   . No pertinent past medical history     Past Surgical History  Procedure Date  . No past surgeries     Family History  Problem Relation Age of Onset  . Anesthesia problems Neg Hx   . Hypotension Neg Hx   . Malignant hyperthermia Neg Hx   . Pseudochol deficiency Neg Hx     History  Substance Use Topics  . Smoking status: Current Everyday Smoker -- 0.5 packs/day  . Smokeless tobacco: Never Used  . Alcohol Use: No    Allergies:  Allergies  Allergen Reactions  . Other Anaphylaxis, Rash and Other (See Comments)    Seafood - throat swelling    Prescriptions prior to admission  Medication Sig Dispense Refill  . acetaminophen (TYLENOL) 500 MG tablet Take 1,000 mg by mouth daily as needed. For pain        Review of Systems  Gastrointestinal: Negative for abdominal pain.  Genitourinary:       No vaginal bleeding   Physical Exam   Blood pressure 115/49, pulse 87, temperature 98 F (36.7 C), temperature source Oral, resp. rate 18, last menstrual period 07/19/2011.  Physical Exam  Nursing note and vitals reviewed. Constitutional: She is oriented to person, place, and time. She appears well-developed and well-nourished.  HENT:  Head: Normocephalic.  Eyes: EOM are normal.  Neck: Neck supple.  Musculoskeletal: Normal range of motion.  Neurological: She is alert and oriented to person, place, and time.  Skin: Skin is warm and dry.  Psychiatric:       Upset  and crying re: news of ectopic pregnancy    MAU Course  Procedures Results for orders placed during the hospital encounter of 09/07/11 (from the past 24 hour(s))  HCG, QUANTITATIVE, PREGNANCY     Status: Abnormal   Collection Time   09/07/11  9:52 AM      Component Value Range   hCG, Beta Chain, Quant, S 1939 (*) <5 (mIU/mL)  CBC     Status: Normal   Collection Time   09/07/11  9:52 AM      Component Value Range   WBC 7.7  4.0 - 10.5 (K/uL)   RBC 4.05  3.87 - 5.11 (MIL/uL)   Hemoglobin 12.5  12.0 - 15.0 (g/dL)   HCT 40.9  81.1 - 91.4 (%)   MCV 92.8  78.0 - 100.0 (fL)   MCH 30.9  26.0 - 34.0 (pg)   MCHC 33.2  30.0 - 36.0 (g/dL)   RDW 78.2  95.6 - 21.3 (%)   Platelets 278  150 - 400 (K/uL)  DIFFERENTIAL     Status: Normal   Collection Time   09/07/11  9:52 AM      Component Value Range   Neutrophils Relative 58  43 - 77 (%)  Neutro Abs 4.5  1.7 - 7.7 (K/uL)   Lymphocytes Relative 28  12 - 46 (%)   Lymphs Abs 2.2  0.7 - 4.0 (K/uL)   Monocytes Relative 8  3 - 12 (%)   Monocytes Absolute 0.6  0.1 - 1.0 (K/uL)   Eosinophils Relative 5  0 - 5 (%)   Eosinophils Absolute 0.4  0.0 - 0.7 (K/uL)   Basophils Relative 0  0 - 1 (%)   Basophils Absolute 0.0  0.0 - 0.1 (K/uL)  AST     Status: Normal   Collection Time   09/07/11  9:52 AM      Component Value Range   AST 14  0 - 37 (U/L)  BUN     Status: Normal   Collection Time   09/07/11  9:52 AM      Component Value Range   BUN 9  6 - 23 (mg/dL)  CREATININE, SERUM     Status: Normal   Collection Time   09/07/11  9:52 AM      Component Value Range   Creatinine, Ser 0.70  0.50 - 1.10 (mg/dL)   GFR calc non Af Amer >90  >90 (mL/min)   GFR calc Af Amer >90  >90 (mL/min)   MDM Clinical Data: Abnormal rising Beta HCG.  TRANSVAGINAL OB ULTRASOUND  Technique: Transvaginal ultrasound was performed for evaluation of  the gestation as well as the maternal uterus and adnexal regions.  Comparison: None  Findings: There is no intrauterine  gestational sac.  Maternal uterus / adnexa:  The right ovary appears normal.  Simple-appearing paraovarian cyst on the left measures 1.4 x 1.0  cm.  Adjacent to the left ovary there is a solid mass which measures 1.7  x 1.0 x 1.3 cm. No documented blood flow within this mass is  identified.  There is no free fluid within the pelvis.  IMPRESSION:  1. There is no evidence for intrauterine pregnancy.  2. Solid left adnexal mass is indeterminate. In the setting of a  positive pregnancy test I cannot exclude an ectopic pregnancy.,  this will need close clinical follow-up with serial Beta HCG and  pelvic sonography.  3. No significant free fluid within the pelvis  Critical Value/emergent results were called by telephone at the  time of interpretation on 09/07/2011 at 12:11 p.m. to Cheyenne Walton, who verbally acknowledged these results.  1215  Consult with Dr. Marice Walton 1218  Discussed lab and ultrasound results with client and her mother.  Client very upset.  This is a wanted pregnancy.  Discussed surgery and methotrexate as options for management of ectopic pregnancy.  Client elects methotrexate.  Ectopic pregnancy information sheet given.  Rhogam given on 08-25-11  Assessment and Plan  Ectopic pregnancy with methotrexate  Plan Return on day 4 and day 7 for repeat quant Return sooner for any pain or bleeding Pelvic rest Ectopic precautions  Cheyenne Walton 09/07/2011, 12:26 PM

## 2011-09-07 NOTE — ED Notes (Signed)
MTX given. Pt instructed in S/S and when to return for f/u labe. Pt and mother verbalized understanding.

## 2011-09-07 NOTE — ED Notes (Signed)
NP discussed U/S finding of ectopic pregnancy with pt and mother. Both very upset and crying. Gave them some time to grieve . MTX therapy explained and pt verbalized understansding.

## 2011-09-07 NOTE — MAU Note (Signed)
Pt here for f/u BHCG. Denies pain or vag bleeding. 

## 2011-09-07 NOTE — Discharge Instructions (Signed)
Return on Wednesday and Saturday morning between 8 am and 12 noon for repeat lab work.  We expect your hormone level to be going down by Saturday.  Sometimes a second shot is needed.  We will be evaluating your to see if that is needed. Do not put anything in the vagina - no tampons, no douching. Do not have sexual intercourse. Do not lift more than 5 pounds. Return if you develop sudden, worse abdominal pain. Return if you have heavy vaginal bleeding.

## 2011-09-10 ENCOUNTER — Inpatient Hospital Stay (HOSPITAL_COMMUNITY)
Admission: AD | Admit: 2011-09-10 | Discharge: 2011-09-10 | Disposition: A | Discharge status: Home / Self Care | Payer: Medicaid Other | Source: Ambulatory Visit | Attending: Obstetrics & Gynecology | Admitting: Obstetrics & Gynecology

## 2011-09-10 DIAGNOSIS — O00109 Unspecified tubal pregnancy without intrauterine pregnancy: Secondary | ICD-10-CM | POA: Insufficient documentation

## 2011-09-10 LAB — HCG, QUANTITATIVE, PREGNANCY: hCG, Beta Chain, Quant, S: 2293 m[IU]/mL — ABNORMAL HIGH (ref <5)

## 2011-09-10 NOTE — MAU Provider Note (Signed)
  History     CSN: [id number]  Arrival date and time: 09/10/11 1914   First Provider Initiated Contact with Patient 09/10/11 0920      Chief Complaint  Patient presents with  . Labs Only   HPI Cheyenne Walton 21 y.o. Comes to MAU for follow up quant.  Today is day 4 after methotrexate for ectopic pregnancy given on 09-07-11.  Quants had been 09-05-11 - 1618; 09-07-11 - 1939 (MTX given).  Having no pain or bleeding today.     OB History    Grav Para Term Preterm Abortions TAB SAB Ect Mult Living   1 0 0 0 0 0 0 0 0 0       Past Medical History  Diagnosis Date  . UTI (urinary tract infection)   . Asthma   . No pertinent past medical history     Past Surgical History  Procedure Date  . No past surgeries     Family History  Problem Relation Age of Onset  . Anesthesia problems Neg Hx   . Hypotension Neg Hx   . Malignant hyperthermia Neg Hx   . Pseudochol deficiency Neg Hx     History  Substance Use Topics  . Smoking status: Current Everyday Smoker -- 0.5 packs/day  . Smokeless tobacco: Never Used  . Alcohol Use: No    Allergies:  Allergies  Allergen Reactions  . Other Anaphylaxis, Rash and Other (See Comments)    Seafood - throat swelling    Prescriptions prior to admission  Medication Sig Dispense Refill  . acetaminophen (TYLENOL) 500 MG tablet Take 1,000 mg by mouth daily as needed. For pain        ROS Physical Exam   Last menstrual period 07/19/2011.  Physical Exam  Nursing note and vitals reviewed. Constitutional: She is oriented to person, place, and time. She appears well-developed and well-nourished.  HENT:  Head: Normocephalic.  Eyes: EOM are normal.  Musculoskeletal: Normal range of motion.  Neurological: She is alert and oriented to person, place, and time.  Skin: Skin is warm and dry.  Psychiatric: She has a normal mood and affect.    MAU Course  Procedures Reviewed plan with client and her mother.  Answered their questions.   Reviewed  results with client.  Agreed to return on Saturday for repeat labs.  MDM Results for orders placed during the hospital encounter of 09/10/11 (from the past 24 hour(s))  HCG, QUANTITATIVE, PREGNANCY     Status: Abnormal   Collection Time   09/10/11  9:10 AM      Component Value Range   hCG, Beta Chain, Quant, S 2293 (*) <5 (mIU/mL)     Assessment and Plan  Ectopic pregnancy Day 4 - quant 2293 - rise over quant on day 1 of methotrexate  Plan Return on Saturday for repeat quant Return sooner if having pain or bleeding.  Cheyenne Walton 09/10/2011, 11:11 AM

## 2011-09-10 NOTE — MAU Note (Signed)
Pt here for repeat bhcg levels post methotrexate. Denies pain or bleeding.

## 2011-09-13 ENCOUNTER — Inpatient Hospital Stay (HOSPITAL_COMMUNITY): Payer: Medicaid Other

## 2011-09-13 ENCOUNTER — Encounter (HOSPITAL_COMMUNITY): Payer: Self-pay

## 2011-09-13 ENCOUNTER — Encounter (HOSPITAL_COMMUNITY)
Admission: AD | Disposition: A | Discharge status: Home / Self Care | Payer: Self-pay | Source: Ambulatory Visit | Attending: Obstetrics & Gynecology

## 2011-09-13 ENCOUNTER — Ambulatory Visit (HOSPITAL_COMMUNITY)
Admission: AD | Admit: 2011-09-13 | Discharge: 2011-09-13 | Disposition: A | Discharge status: Home / Self Care | Payer: Medicaid Other | Source: Ambulatory Visit | Attending: Obstetrics & Gynecology | Admitting: Obstetrics & Gynecology

## 2011-09-13 DIAGNOSIS — O00109 Unspecified tubal pregnancy without intrauterine pregnancy: Principal | ICD-10-CM | POA: Insufficient documentation

## 2011-09-13 DIAGNOSIS — O009 Unspecified ectopic pregnancy without intrauterine pregnancy: Secondary | ICD-10-CM | POA: Diagnosis present

## 2011-09-13 HISTORY — PX: LAPAROSCOPY: SHX197

## 2011-09-13 LAB — DIFFERENTIAL
Basophils Absolute: 0 10*3/uL (ref 0.0–0.1)
Basophils Relative: 0 % (ref 0–1)
Eosinophils Relative: 5 % (ref 0–5)
Lymphocytes Relative: 35 % (ref 12–46)
Monocytes Absolute: 0.8 10*3/uL (ref 0.1–1.0)
Monocytes Relative: 8 % (ref 3–12)
Neutro Abs: 5.6 10*3/uL (ref 1.7–7.7)

## 2011-09-13 LAB — AST: AST: 16 U/L (ref 0–37)

## 2011-09-13 LAB — CBC
HCT: 39.9 % (ref 36.0–46.0)
Hemoglobin: 13.1 g/dL (ref 12.0–15.0)
MCHC: 32.8 g/dL (ref 30.0–36.0)
MCV: 93.4 fL (ref 78.0–100.0)
RDW: 13.5 % (ref 11.5–15.5)

## 2011-09-13 LAB — BUN: BUN: 6 mg/dL (ref 6–23)

## 2011-09-13 SURGERY — LAPAROSCOPY OPERATIVE
Anesthesia: General | Site: Abdomen | Laterality: Left | Wound class: Clean Contaminated

## 2011-09-13 MED ORDER — METHOTREXATE INJECTION FOR WOMEN'S HOSPITAL: 50.0000 mg/m2 | Freq: Once | INTRAMUSCULAR | Status: DC

## 2011-09-13 MED ORDER — MEPERIDINE HCL 25 MG/ML IJ SOLN: 6.2500 mg | INTRAMUSCULAR | Status: DC | PRN

## 2011-09-13 MED ORDER — DOCUSATE SODIUM 100 MG PO CAPS: 100.0000 mg | ORAL_CAPSULE | Freq: Two times a day (BID) | ORAL | Status: AC | PRN

## 2011-09-13 MED ORDER — SODIUM CHLORIDE 0.9 % IR SOLN
Status: DC | PRN
  Administered 2011-09-13: 1000 mL

## 2011-09-13 MED ORDER — LACTATED RINGERS IV SOLN
INTRAVENOUS | Status: DC
  Administered 2011-09-13: 13:00:00 via INTRAVENOUS

## 2011-09-13 MED ORDER — SUCCINYLCHOLINE CHLORIDE 20 MG/ML IJ SOLN
INTRAMUSCULAR | Status: DC | PRN
  Administered 2011-09-13: 140 mg via INTRAVENOUS

## 2011-09-13 MED ORDER — LIDOCAINE HCL (CARDIAC) 20 MG/ML IV SOLN
INTRAVENOUS | Status: DC | PRN
  Administered 2011-09-13: 20 mg via INTRAVENOUS

## 2011-09-13 MED ORDER — METOCLOPRAMIDE HCL 5 MG/ML IJ SOLN: 10.0000 mg | Freq: Once | INTRAMUSCULAR | Status: DC | PRN

## 2011-09-13 MED ORDER — FENTANYL CITRATE 0.05 MG/ML IJ SOLN: 25.0000 ug | INTRAMUSCULAR | Status: DC | PRN

## 2011-09-13 MED ORDER — ROCURONIUM BROMIDE 100 MG/10ML IV SOLN
INTRAVENOUS | Status: DC | PRN
  Administered 2011-09-13: 25 mg via INTRAVENOUS

## 2011-09-13 MED ORDER — KETOROLAC TROMETHAMINE 30 MG/ML IJ SOLN
INTRAMUSCULAR | Status: DC | PRN
  Administered 2011-09-13: 30 mg via INTRAVENOUS

## 2011-09-13 MED ORDER — LACTATED RINGERS IV SOLN
INTRAVENOUS | Status: DC | PRN
  Administered 2011-09-13 (×2): via INTRAVENOUS

## 2011-09-13 MED ORDER — OXYCODONE-ACETAMINOPHEN 5-325 MG PO TABS: 1.0000 | ORAL_TABLET | Freq: Four times a day (QID) | ORAL | Status: AC | PRN

## 2011-09-13 MED ORDER — FENTANYL CITRATE 0.05 MG/ML IJ SOLN
INTRAMUSCULAR | Status: DC | PRN
  Administered 2011-09-13: 50 ug via INTRAVENOUS
  Administered 2011-09-13: 100 ug via INTRAVENOUS
  Administered 2011-09-13: 50 ug via INTRAVENOUS
  Administered 2011-09-13: 100 ug via INTRAVENOUS
  Administered 2011-09-13: 50 ug via INTRAVENOUS

## 2011-09-13 MED ORDER — BUPIVACAINE HCL (PF) 0.25 % IJ SOLN
INTRAMUSCULAR | Status: DC | PRN
  Administered 2011-09-13: 10 mL

## 2011-09-13 MED ORDER — IBUPROFEN 600 MG PO TABS: 600.0000 mg | ORAL_TABLET | Freq: Four times a day (QID) | ORAL | Status: AC | PRN

## 2011-09-13 MED ORDER — CEFAZOLIN SODIUM 1-5 GM-% IV SOLN
1.0000 g | Freq: Once | INTRAVENOUS | Status: AC
  Administered 2011-09-13: 1 g via INTRAVENOUS
  Filled 2011-09-13: qty 50

## 2011-09-13 MED ORDER — ONDANSETRON HCL 4 MG/2ML IJ SOLN
INTRAMUSCULAR | Status: DC | PRN
  Administered 2011-09-13: 4 mg via INTRAVENOUS

## 2011-09-13 MED ORDER — GLYCOPYRROLATE 0.2 MG/ML IJ SOLN
INTRAMUSCULAR | Status: DC | PRN
  Administered 2011-09-13: 0.2 mg via INTRAVENOUS
  Administered 2011-09-13: 1 mg via INTRAVENOUS

## 2011-09-13 MED ORDER — NEOSTIGMINE METHYLSULFATE 1 MG/ML IJ SOLN
INTRAMUSCULAR | Status: DC | PRN
  Administered 2011-09-13: 5 mg via INTRAVENOUS

## 2011-09-13 MED ORDER — PROPOFOL 10 MG/ML IV EMUL
INTRAVENOUS | Status: DC | PRN
  Administered 2011-09-13: 200 mg via INTRAVENOUS

## 2011-09-13 MED ORDER — DEXAMETHASONE SODIUM PHOSPHATE 4 MG/ML IJ SOLN
INTRAMUSCULAR | Status: DC | PRN
  Administered 2011-09-13: 10 mg via INTRAVENOUS

## 2011-09-13 MED ORDER — MIDAZOLAM HCL 5 MG/5ML IJ SOLN
INTRAMUSCULAR | Status: DC | PRN
  Administered 2011-09-13: 2 mg via INTRAVENOUS

## 2011-09-13 SURGICAL SUPPLY — 32 items
CABLE HIGH FREQUENCY MONO STRZ (ELECTRODE) IMPLANT
CHLORAPREP W/TINT 26ML (MISCELLANEOUS) ×2 IMPLANT
CLOTH BEACON ORANGE TIMEOUT ST (SAFETY) ×2 IMPLANT
DERMABOND ADHESIVE PROPEN (GAUZE/BANDAGES/DRESSINGS) ×1
DERMABOND ADVANCED .7 DNX6 (GAUZE/BANDAGES/DRESSINGS) ×1 IMPLANT
DRSG COVADERM PLUS 2X2 (GAUZE/BANDAGES/DRESSINGS) ×2 IMPLANT
GLOVE BIO SURGEON STRL SZ7 (GLOVE) IMPLANT
GLOVE BIOGEL PI IND STRL 7.0 (GLOVE) ×2 IMPLANT
GLOVE BIOGEL PI INDICATOR 7.0 (GLOVE) ×2
GLOVE SKINSENSE NS SZ6.5 (GLOVE) ×2
GLOVE SKINSENSE NS SZ7.0 (GLOVE) ×1
GLOVE SKINSENSE STRL SZ6.5 (GLOVE) ×2 IMPLANT
GLOVE SKINSENSE STRL SZ7.0 (GLOVE) ×1 IMPLANT
GOWN PREVENTION PLUS LG XLONG (DISPOSABLE) ×4 IMPLANT
NEEDLE GYRUS 33CM (NEEDLE) ×2 IMPLANT
NEEDLE INSUFFLATION 14GA 120MM (NEEDLE) ×2 IMPLANT
NS IRRIG 1000ML POUR BTL (IV SOLUTION) ×2 IMPLANT
PACK LAPAROSCOPY BASIN (CUSTOM PROCEDURE TRAY) ×2 IMPLANT
POUCH SPECIMEN RETRIEVAL 10MM (ENDOMECHANICALS) ×2 IMPLANT
PROTECTOR NERVE ULNAR (MISCELLANEOUS) ×2 IMPLANT
SEALER TISSUE G2 CVD JAW 35 (ENDOMECHANICALS) IMPLANT
SEALER TISSUE G2 CVD JAW 45CM (ENDOMECHANICALS) IMPLANT
SET IRRIG TUBING LAPAROSCOPIC (IRRIGATION / IRRIGATOR) IMPLANT
SUT VIC AB 3-0 X1 27 (SUTURE) IMPLANT
SUT VICRYL 0 UR6 27IN ABS (SUTURE) ×4 IMPLANT
SUT VICRYL 4-0 PS2 18IN ABS (SUTURE) ×2 IMPLANT
TOWEL OR 17X24 6PK STRL BLUE (TOWEL DISPOSABLE) ×4 IMPLANT
TRAY FOLEY CATH 14FR (SET/KITS/TRAYS/PACK) ×2 IMPLANT
TROCAR Z-THREAD BLADED 11X100M (TROCAR) ×2 IMPLANT
TROCAR Z-THREAD BLADED 5X100MM (TROCAR) ×4 IMPLANT
WARMER LAPAROSCOPE (MISCELLANEOUS) ×2 IMPLANT
WATER STERILE IRR 1000ML POUR (IV SOLUTION) ×2 IMPLANT

## 2011-09-13 NOTE — Discharge Instructions (Signed)
Salpingectomy Salpingectomy, also called tubectomy, is the removal of one of the fallopian tubes with surgery. The fallopian tubes are the tubes that are connected to the uterus. These tubes transport the egg from the ovary to the womb (uterus). Removing one tube does not prevent you from becoming pregnant. Salpingectomy does not have any adverse affects on your menstrual periods. There are many reasons to have a salpingectomy, such as if:  You have a tubal (ectopic) pregnancy. This is especially true if the tube ruptures.   You have an infected tube.   It is necessary to remove the tube when removing an ovary with a cyst or tumor.   The uterus needs to be removed.   You need surgery for cancer of the tube or other female organs.  RISKS AND COMPLICATIONS   Injury to surrounding organs.   Bleeding.   Infection.   The surgery does not correct the problem.   You have problems with the anesthesia.  PROCEDURE  You will be given an IV (intravenous) and a medication to relax you. Then, you will be put to sleep with a drug (anesthetic). Any hair on your lower belly (abdomen) will be removed and a catheter will be placed in your bladder. Through 2 very small cuts (incisions) if you have a laparoscopy, or a large incision in the lower abdomen, the fallopian tube will be removed from where it attaches to the uterus. The blood vessels will be clamped and tied.  AFTER THE PROCEDURE   You will be taken to the recovery room and observed for 1 to 2 hours.   If you had a laparoscopy, you may be discharged to home   If you had a laparoscopy, you may have shoulder pain. This is not unusual and is from some air that is left in the abdomen. This air affects the nerve that goes from the diaphragm to the shoulder. It goes away in a day or two.   You will be given pain medication if needed.   The intravenous and catheter will be removed before you are discharged.   Have someone available to take you  home.    Document Released: 10/12/2008 Document Revised: 05/15/2011 Document Reviewed: 10/12/2008 [facility] Patient Information 2012 [place], [place].

## 2011-09-13 NOTE — MAU Provider Note (Signed)
Attestation of Attending Supervision of Advanced Practitioner: Evaluation and management procedures were performed by the PA/NP/CNM/OB Fellow under my supervision/collaboration. Chart reviewed, and agree with management and plan.  Please see H&P for further details about this admission for surgery.  Jaynie Collins, M.D. 09/13/2011 1:06 PM

## 2011-09-13 NOTE — MAU Provider Note (Signed)
Cheyenne Walton is a 21 y.o. female who presents to MAU for follow up Bhcg s/p MTX for ectopic pregnancy. Patient reports increased abdominal pain and a brown vaginal discharge. On 3/29 Bhcg was 1618 and on 3/31 was 1939 and patient received MTX for ectopic pregnancy. Day 4 after MTX Bhcg was 2293 and today, day 7 it is 2130.  VS reviewed and are normal.  Discussed results with patient and since she has increased pain will repeat the ultrasound today.  US Ob Transvaginal  09/13/2011  *RADIOLOGY REPORT*  Clinical Data: 21 year old female status post methotrexate treatment.  Quantitative beta HCG slightly lower, reported today at 2198.  TRANSVAGINAL OBSTETRIC US  Technique:  Transvaginal ultrasound was performed for complete evaluation of the gestation as well as the maternal uterus, adnexal regions, and pelvic cul-de-sac.  Comparison:  09/07/2011 and earlier.  Intrauterine gestational sac: None Yolk sac: None Embryo: None Cardiac Activity: None Subchorionic hemorrhage: None  Maternal uterus/adnexae: Trace pelvic free fluid appears fairly simple (image 37).  The left adnexa today measures 3.6 x 2.4 x 1.8 cm (previously 4.2 x 2.1 x 2.3 cm).  Adjacent to the left adnexa at best seen on image 31 there is a slightly hyperechoic mass which today measures 1.6 x 1.7 x 1.3 cm (previously 1.7 x 1.1 x 1.3 cm).  There is mild associated vascularity on power Doppler (image 32).  A small simple appearing left adnexal cyst is also present measuring 13 mm in diameter.  The right ovary appears normal with multiple small follicles measuring 3.9 x 1.4 x 2.8 cm.  IMPRESSION: 1.  As before, no IUP identified. 2.  Solid slightly vascular mass re-identified adjacent to the left ovary is stable in size, 1.6 x 1.7 x 1.3 cm.  This remains indeterminate, but suspicious for ectopic pregnancy in this setting. 3.  Trace pelvic free fluid appears fairly simple.  The right ovary remains within normal limits. 4.  Recommend continued serial  quantitative beta HCG and imaging follow-up as necessary.  Original Report Authenticated By: Harley Hallmark, M.D.   Discussed results with Dr. Macon Large and will administer patient second MTX.  Patient does not want a second MTX and request surgery. Dr. Macon Large in to discuss surgery with the patient.

## 2011-09-13 NOTE — H&P (Signed)
Preoperative History and Physical  Shamel Galyean is a 21 y.o. G1P0 at [redacted]w[redacted]d with known left ectopic pregnancy s/p failed MTX treatment who now desires surgical management.  Patient reports increased abdominal pain and brown vaginal discharge. On 3/29 BHCG was 1618 and on 3/31 was 1939 and patient received MTX for ectopic pregnancy. Day 4 after MTX Bhcg was 2293 and today, day 7 it is 2130.  Patient was offered 2nd MTX dose, but she declines this and wants surgery.    Proposed surgery: Laparoscopic left salpingectomy and removal of ectopic pregnancy  PMH:    Past Medical History  Diagnosis Date  . UTI (urinary tract infection)   . Asthma   . No pertinent past medical history     PSH:     Past Surgical History  Procedure Date  . No past surgeries     POb/GynH:   OB History    Grav Para Term Preterm Abortions TAB SAB Ect Mult Living   1 0 0 0 0 0 0 0 0 0      Medications: Current facility-administered medications:ceFAZolin (ANCEF) IVPB 1 g/50 mL premix, 1 g, Intravenous, Once, Tereso Newcomer, MD;  DISCONTD: methotrexate chemo injection 50 mg/m2, 50 mg/m2, Intramuscular, Once, [facility] Orlene Och, NP  Allergies:  Allergies  Allergen Reactions  . Contrast Media (Iodinated Diagnostic Agents) Anaphylaxis  . Latex Hives  . Other Anaphylaxis, Rash and Other (See Comments)    Seafood - throat swelling    SH:   History  Substance Use Topics  . Smoking status: Current Everyday Smoker -- 0.5 packs/day  . Smokeless tobacco: Never Used  . Alcohol Use: No    FH:    Family History  Problem Relation Age of Onset  . Anesthesia problems Neg Hx   . Hypotension Neg Hx   . Malignant hyperthermia Neg Hx   . Pseudochol deficiency Neg Hx     Review of Systems: Noncontributory  PHYSICAL EXAM: Blood pressure 110/64, pulse 78, resp. rate 18, last menstrual period 07/19/2011. General appearance - alert, well appearing, and in no distress Chest - clear to auscultation, no wheezes, rales or  rhonchi, symmetric air entry Heart - normal rate and regular rhythm Abdomen - tenderness noted in LLQ, no guarding,  no rebound tenderness noted Pelvic - examination not indicated Extremities - peripheral pulses normal, no pedal edema, no clubbing or cyanosis  Labs: Recent Results (from the past 336 hour(s))  HCG, QUANTITATIVE, PREGNANCY   Collection Time   09/03/11  2:10 PM      Component Value Range   hCG, Beta Chain, Quant, S 1180 (*) <5 (mIU/mL)  HCG, QUANTITATIVE, PREGNANCY   Collection Time   09/05/11 12:35 PM      Component Value Range   hCG, Beta Chain, Quant, S 1618 (*) <5 (mIU/mL)  HCG, QUANTITATIVE, PREGNANCY   Collection Time   09/07/11  9:52 AM      Component Value Range   hCG, Beta Chain, Quant, S 1939 (*) <5 (mIU/mL)  CBC   Collection Time   09/07/11  9:52 AM      Component Value Range   WBC 7.7  4.0 - 10.5 (K/uL)   RBC 4.05  3.87 - 5.11 (MIL/uL)   Hemoglobin 12.5  12.0 - 15.0 (g/dL)   HCT 69.6  29.5 - 28.4 (%)   MCV 92.8  78.0 - 100.0 (fL)   MCH 30.9  26.0 - 34.0 (pg)   MCHC 33.2  30.0 - 36.0 (g/dL)  RDW 13.6  11.5 - 15.5 (%)   Platelets 278  150 - 400 (K/uL)  DIFFERENTIAL   Collection Time   09/07/11  9:52 AM      Component Value Range   Neutrophils Relative 58  43 - 77 (%)   Neutro Abs 4.5  1.7 - 7.7 (K/uL)   Lymphocytes Relative 28  12 - 46 (%)   Lymphs Abs 2.2  0.7 - 4.0 (K/uL)   Monocytes Relative 8  3 - 12 (%)   Monocytes Absolute 0.6  0.1 - 1.0 (K/uL)   Eosinophils Relative 5  0 - 5 (%)   Eosinophils Absolute 0.4  0.0 - 0.7 (K/uL)   Basophils Relative 0  0 - 1 (%)   Basophils Absolute 0.0  0.0 - 0.1 (K/uL)  AST   Collection Time   09/07/11  9:52 AM      Component Value Range   AST 14  0 - 37 (U/L)  BUN   Collection Time   09/07/11  9:52 AM      Component Value Range   BUN 9  6 - 23 (mg/dL)  CREATININE, SERUM   Collection Time   09/07/11  9:52 AM      Component Value Range   Creatinine, Ser 0.70  0.50 - 1.10 (mg/dL)   GFR calc non Af  Amer >90  >90 (mL/min)   GFR calc Af Amer >90  >90 (mL/min)  HCG, QUANTITATIVE, PREGNANCY   Collection Time   09/10/11  9:10 AM      Component Value Range   hCG, Beta Chain, Quant, S 2293 (*) <5 (mIU/mL)  HCG, QUANTITATIVE, PREGNANCY   Collection Time   09/13/11  9:19 AM      Component Value Range   hCG, Beta Chain, Quant, S 2130 (*) <5 (mIU/mL)  AST   Collection Time   09/13/11  9:19 AM      Component Value Range   AST 16  0 - 37 (U/L)  BUN   Collection Time   09/13/11  9:19 AM      Component Value Range   BUN 6  6 - 23 (mg/dL)  CREATININE, SERUM   Collection Time   09/13/11  9:19 AM      Component Value Range   Creatinine, Ser 0.69  0.50 - 1.10 (mg/dL)   GFR calc non Af Amer >90  >90 (mL/min)   GFR calc Af Amer >90  >90 (mL/min)  CBC   Collection Time   09/13/11  9:19 AM      Component Value Range   WBC 10.6 (*) 4.0 - 10.5 (K/uL)   RBC 4.27  3.87 - 5.11 (MIL/uL)   Hemoglobin 13.1  12.0 - 15.0 (g/dL)   HCT 86.5  78.4 - 69.6 (%)   MCV 93.4  78.0 - 100.0 (fL)   MCH 30.7  26.0 - 34.0 (pg)   MCHC 32.8  30.0 - 36.0 (g/dL)   RDW 29.5  28.4 - 13.2 (%)   Platelets 289  150 - 400 (K/uL)  DIFFERENTIAL   Collection Time   09/13/11  9:19 AM      Component Value Range   Neutrophils Relative 52  43 - 77 (%)   Neutro Abs 5.6  1.7 - 7.7 (K/uL)   Lymphocytes Relative 35  12 - 46 (%)   Lymphs Abs 3.7  0.7 - 4.0 (K/uL)   Monocytes Relative 8  3 - 12 (%)   Monocytes Absolute 0.8  0.1 - 1.0 (K/uL)   Eosinophils Relative 5  0 - 5 (%)   Eosinophils Absolute 0.5  0.0 - 0.7 (K/uL)   Basophils Relative 0  0 - 1 (%)   Basophils Absolute 0.0  0.0 - 0.1 (K/uL)    Imaging Studies:  09/13/2011  *RADIOLOGY REPORT*  Clinical Data: 21 year old female status post methotrexate treatment.  Quantitative beta HCG slightly lower, reported today at 2198.  TRANSVAGINAL OBSTETRIC US  Technique:  Transvaginal ultrasound was performed for complete evaluation of the gestation as well as the maternal uterus, adnexal  regions, and pelvic cul-de-sac.  Comparison:  09/07/2011 and earlier.  Intrauterine gestational sac: None Yolk sac: None Embryo: None Cardiac Activity: None Subchorionic hemorrhage: None  Maternal uterus/adnexae: Trace pelvic free fluid appears fairly simple (image 37).  The left adnexa today measures 3.6 x 2.4 x 1.8 cm (previously 4.2 x 2.1 x 2.3 cm).  Adjacent to the left adnexa at best seen on image 31 there is a slightly hyperechoic mass which today measures 1.6 x 1.7 x 1.3 cm (previously 1.7 x 1.1 x 1.3 cm).  There is mild associated vascularity on power Doppler (image 32).  A small simple appearing left adnexal cyst is also present measuring 13 mm in diameter.  The right ovary appears normal with multiple small follicles measuring 3.9 x 1.4 x 2.8 cm.  IMPRESSION: 1.  As before, no IUP identified. 2.  Solid slightly vascular mass re-identified adjacent to the left ovary is stable in size, 1.6 x 1.7 x 1.3 cm.  This remains indeterminate, but suspicious for ectopic pregnancy in this setting. 3.  Trace pelvic free fluid appears fairly simple.  The right ovary remains within normal limits. 4.  Recommend continued serial quantitative beta HCG and imaging follow-up as necessary.  Original Report Authenticated By: Harley Hallmark, M.D.   09/07/2011  *RADIOLOGY REPORT*  Clinical Data: Abnormal rising Beta HCG.  TRANSVAGINAL OB ULTRASOUND  Technique:  Transvaginal ultrasound was performed for evaluation of the gestation as well as the maternal uterus and adnexal regions.  Comparison: None  Findings: There is no intrauterine gestational sac.  Maternal uterus / adnexa:  The right ovary appears normal.  Simple-appearing paraovarian cyst on the left measures 1.4 x 1.0 cm.  Adjacent to the left ovary there is a solid mass which measures 1.7 x 1.0 x 1.3 cm.  No documented blood flow within this mass is identified.  There is no free fluid within the pelvis.  IMPRESSION:  1.  There is no evidence for intrauterine pregnancy. 2.   Solid left adnexal mass is indeterminate. In the setting of a positive pregnancy test I cannot exclude an ectopic pregnancy., this will need close clinical follow-up with serial Beta HCG and pelvic sonography. 3.  No significant free fluid within the pelvis  Critical Value/emergent results were called by telephone at the time of interpretation on 09/07/2011  at 12:11 p.m.  to  Mindi Junker, who verbally acknowledged these results.  Original Report Authenticated By: Rosealee Albee, M.D.   Assessment: Patient Active Problem List  Diagnoses  . Left Tubal Ectopic Pregnancy    Plan: 21 y.o. G1P0 with left ectopic pregnancy desiring surgery after failed MTX treatment.   On exam, she had stable vital signs, and no peritoneal signs.  Risks of surgery including bleeding which may require transfusion or reoperation, infection, injury to bowel or other surrounding organs, need for additional procedures including laparoscopy or laparotomy were explained to patient and written informed  consent was obtained.  Patient has been NPO since 2000 last night and she will remain NPO for procedure. Anesthesia and OR aware.  Preoperative prophylactic Ancef 1g IV ordered on call to the OR.  To OR when ready.   Jaynie Collins, M.D. 09/13/2011 12:28 PM

## 2011-09-13 NOTE — Anesthesia Procedure Notes (Signed)
Procedure Name: Intubation Date/Time: 09/13/2011 1:24 PM Performed by: Lincoln Brigham Pre-anesthesia Checklist: Patient identified, Patient being monitored, Emergency Drugs available, Timeout performed and Suction available Patient Re-evaluated:Patient Re-evaluated prior to inductionOxygen Delivery Method: Circle system utilized, Nasal cannula, Simple face mask, Ambu bag and Non-rebreather mask Preoxygenation: Pre-oxygenation with 100% oxygen Intubation Type: IV induction, Rapid sequence and Cricoid Pressure applied Laryngoscope Size: Miller and 2 Grade View: Grade II Tube size: 7.0 mm Number of attempts: 1 Airway Equipment and Method: Stylet Placement Confirmation: ETT inserted through vocal cords under direct vision,  positive ETCO2 and breath sounds checked- equal and bilateral Secured at: 22 cm Tube secured with: Tape Dental Injury: Teeth and Oropharynx as per pre-operative assessment

## 2011-09-13 NOTE — Op Note (Addendum)
Cheyenne Walton PROCEDURE DATE: 09/13/2011  PREOPERATIVE DIAGNOSIS: Left ectopic pregnancy; failed methotrexate therapy POSTOPERATIVE DIAGNOSIS: Left fallopian tube ectopic pregnancy; failed methotrexate therapy PROCEDURE: Laparoscopic left salpingectomy and removal of ectopic pregnancy SURGEON:  Dr. Jaynie Collins ANESTHESIOLOGIST: Tyrone Apple. Malen Gauze, MD - Anesthesiologist; Rica Records, CRNA - CRNA  INDICATIONS: 21 y.o. G1P0000 at [redacted]w[redacted]d here for with left ectopic pregnancy after failed methotrexate therapy. She declined a second dose of methotrexate.  On exam, she had stable vital signs, and an acute abdomen. Hgb 13.1, blood type A neg. Patient was counseled regarding need for laparoscopic salpingectomy. Risks of surgery including bleeding which may require transfusion or reoperation, infection, injury to bowel or other surrounding organs, need for additional procedures including laparotomy and other postoperative/anesthesia complications were explained to patient.  Written informed consent was obtained.  FINDINGS:  Trace amount of hemoperitoneum estimated to be about 20 ml of blood and clots in the posterior cul-de-sac.  Dilated left fallopian tube containing ectopic gestation; also had paratubal cysts. Small normal appearing uterus, normal right fallopian tube, left ovary and right ovary.  ANESTHESIA: General INTRAVENOUS FLUIDS: .1700 ml ESTIMATED BLOOD LOSS:  30 ml URINE OUTPUT: 75 ml SPECIMENS: Left fallopian tube containing ectopic gestation COMPLICATIONS: None immediate  PROCEDURE IN DETAIL:  The patient was taken to the operating room where general anesthesia was administered and was found to be adequate.  She was placed in the dorsal lithotomy position, and was prepped and draped in a sterile manner.  A Foley catheter was inserted into her bladder and attached to constant drainage and a uterine manipulator was then advanced into the uterus .  After an adequate timeout was performed,  attention was then turned to the patient's abdomen where a 10-mm skin incision was made on the umbilical fold.  The Veress needle was carefully introduced into the peritoneal cavity at a 45-degree angle into the abdominal wall.  Intraperitoneal placement was confirmed by drop in intraabdominal pressure with insufflation of carbon dioxide gas.  Adequate pneumoperitoneum was obtained, and the 10-mm trocar and sleeve were then advanced without difficulty into the abdomen where intraabdominal placement was confirmed by the laparoscope. A survey of the patient's pelvis and abdomen revealed the findings as above.  Two 5-mm left lower quadrant ports were placed under direct visualization.  Attention was then turned to the left fallopian tube which was grasped and ligated from the underlying mesosalpinx and uterine attachment using the Gyrus instrument.  Good hemostasis was noted.  The specimen was placed in an EndoCatch bag and removed from the abdomen intact.  The abdomen was desufflated, and all instruments were removed.  The fascial incisions of the 10-mm site was reapproximated with a 0 Vicryl figure-of-eight stitch; and all skin incisions were closed with Dermabond. The patient tolerated the procedure well.  All instruments, needles, and sponge counts were correct x 2. The patient was taken to the recovery room in stable condition.   The patient will be discharged to home as per PACU criteria.  Routine postoperative instructions given.  She was prescribed Percocet, Ibuprofen and Colace.  She will follow up in the clinic in 3-4 weeks for postoperative evaluation .

## 2011-09-13 NOTE — Transfer of Care (Signed)
Immediate Anesthesia Transfer of Care Note  Patient: Cheyenne Walton  Procedure(s) Performed: Procedure(s) (LRB): LAPAROSCOPY OPERATIVE (Left)  Patient Location: PACU  Anesthesia Type: General  Level of Consciousness: awake, alert , oriented and sedated  Airway & Oxygen Therapy: Patient Spontanous Breathing and Patient connected to nasal cannula oxygen  Post-op Assessment: Report given to PACU RN, Post -op Vital signs reviewed and stable and Patient moving all extremities X 4  Post vital signs: Reviewed and stable  Complications: No apparent anesthesia complications

## 2011-09-13 NOTE — Anesthesia Postprocedure Evaluation (Signed)
  Anesthesia Post-op Note  Patient: Cheyenne Walton  Procedure(s) Performed: Procedure(s) (LRB): LAPAROSCOPY OPERATIVE (Left)  Patient Location: PACU  Anesthesia Type: General  Level of Consciousness: awake, alert  and oriented  Airway and Oxygen Therapy: Patient Spontanous Breathing  Post-op Pain: none  Post-op Assessment: Post-op Vital signs reviewed, Patient's Cardiovascular Status Stable, Respiratory Function Stable, Patent Airway, No signs of Nausea or vomiting and Pain level controlled  Post-op Vital Signs: Reviewed and stable  Complications: No apparent anesthesia complications

## 2011-09-13 NOTE — MAU Note (Signed)
Pt here for follow up after mtx injection. Pt reports abd pain but no vag bleeding.

## 2011-09-13 NOTE — Anesthesia Preprocedure Evaluation (Signed)
Anesthesia Evaluation  Patient identified by MRN, date of birth, ID band Patient awake    Reviewed: Allergy & Precautions, H&P , NPO status , Patient's Chart, lab work & pertinent test results  Airway Mallampati: III TM Distance: >3 FB Neck ROM: full    Dental No notable dental hx. (+) Teeth Intact   Pulmonary neg pulmonary ROS, asthma ,  breath sounds clear to auscultation  Pulmonary exam normal       Cardiovascular negative cardio ROS  Rhythm:regular Rate:Normal     Neuro/Psych negative neurological ROS  negative psych ROS   GI/Hepatic negative GI ROS, Neg liver ROS,   Endo/Other  negative endocrine ROS  Renal/GU negative Renal ROS  negative genitourinary   Musculoskeletal   Abdominal Normal abdominal exam  (+)   Peds  Hematology negative hematology ROS (+)   Anesthesia Other Findings   Reproductive/Obstetrics (+) Pregnancy                           Anesthesia Physical Anesthesia Plan  ASA: II and Emergent  Anesthesia Plan: General ETT   Post-op Pain Management:    Induction:   Airway Management Planned:   Additional Equipment:   Intra-op Plan:   Post-operative Plan:   Informed Consent: I have reviewed the patients History and Physical, chart, labs and discussed the procedure including the risks, benefits and alternatives for the proposed anesthesia with the patient or authorized representative who has indicated his/her understanding and acceptance.   Dental Advisory Given  Plan Discussed with: Anesthesiologist, CRNA and Surgeon  Anesthesia Plan Comments:         Anesthesia Quick Evaluation

## 2011-09-15 ENCOUNTER — Encounter (HOSPITAL_COMMUNITY): Payer: Self-pay | Admitting: Obstetrics & Gynecology

## 2011-10-02 ENCOUNTER — Ambulatory Visit: Payer: Self-pay | Admitting: Obstetrics and Gynecology

## 2011-10-13 ENCOUNTER — Ambulatory Visit: Payer: Self-pay | Admitting: Obstetrics and Gynecology

## 2011-10-29 ENCOUNTER — Telehealth: Payer: Self-pay | Admitting: *Deleted

## 2011-10-29 NOTE — Telephone Encounter (Signed)
Called Cheyenne Walton, denies fever, gave her an appointment for tomorrow for 3:45pm. Patient states she will come in tomorrow.

## 2011-10-29 NOTE — Telephone Encounter (Signed)
Patient called and left a message stating she had an ectopic pregnancy and had surgery and missed both of her follow up appointments- states when she called to check on appts they told her they did not have her scheduled.  States I need to make an appointment because my belly button where they did surgery is oozing and has a horrible smell and is irritated and I need to get it checked

## 2011-10-30 ENCOUNTER — Encounter: Payer: Self-pay | Admitting: Obstetrics & Gynecology

## 2011-10-30 ENCOUNTER — Ambulatory Visit (INDEPENDENT_AMBULATORY_CARE_PROVIDER_SITE_OTHER): Payer: Medicaid Other | Admitting: Obstetrics & Gynecology

## 2011-10-30 DIAGNOSIS — Z09 Encounter for follow-up examination after completed treatment for conditions other than malignant neoplasm: Secondary | ICD-10-CM

## 2011-10-30 NOTE — Progress Notes (Signed)
  Subjective:    Patient ID: Cheyenne Walton, female    DOB: 07-07-1990, 21 y.o.   MRN: [id number]  HPI  21 yo status post l/s for ruptured ectopic pregnancy on 09-13-11. She complains of a sore, oozing, bloody navel incision.  Review of Systems She has had the Gardasil series    Objective:   Physical Exam  No evidence of infection. She has a small amount of granulation tissue and I treated it with silver nitrate.      Assessment & Plan:  Post op -stable RTC 1 year/prn sooner

## 2011-12-26 ENCOUNTER — Encounter (HOSPITAL_COMMUNITY): Payer: Self-pay

## 2011-12-26 ENCOUNTER — Inpatient Hospital Stay (HOSPITAL_COMMUNITY)
Admission: AD | Admit: 2011-12-26 | Discharge: 2011-12-26 | Disposition: A | Discharge status: Home / Self Care | Payer: Medicaid Other | Source: Ambulatory Visit | Attending: Obstetrics & Gynecology | Admitting: Obstetrics & Gynecology

## 2011-12-26 ENCOUNTER — Inpatient Hospital Stay (HOSPITAL_COMMUNITY): Payer: Medicaid Other

## 2011-12-26 ENCOUNTER — Emergency Department (INDEPENDENT_AMBULATORY_CARE_PROVIDER_SITE_OTHER)
Admission: EM | Admit: 2011-12-26 | Discharge: 2011-12-26 | Disposition: A | Discharge status: Nursing Home (Includes ICF) | Payer: Medicaid Other | Source: Home / Self Care | Attending: Emergency Medicine | Admitting: Emergency Medicine

## 2011-12-26 ENCOUNTER — Encounter (HOSPITAL_COMMUNITY): Payer: Self-pay | Admitting: Nurse Practitioner

## 2011-12-26 DIAGNOSIS — N9489 Other specified conditions associated with female genital organs and menstrual cycle: Secondary | ICD-10-CM

## 2011-12-26 DIAGNOSIS — Z202 Contact with and (suspected) exposure to infections with a predominantly sexual mode of transmission: Secondary | ICD-10-CM

## 2011-12-26 DIAGNOSIS — R1032 Left lower quadrant pain: Secondary | ICD-10-CM | POA: Insufficient documentation

## 2011-12-26 DIAGNOSIS — R350 Frequency of micturition: Secondary | ICD-10-CM | POA: Insufficient documentation

## 2011-12-26 DIAGNOSIS — N949 Unspecified condition associated with female genital organs and menstrual cycle: Secondary | ICD-10-CM | POA: Insufficient documentation

## 2011-12-26 DIAGNOSIS — O99891 Other specified diseases and conditions complicating pregnancy: Secondary | ICD-10-CM | POA: Insufficient documentation

## 2011-12-26 HISTORY — DX: Unspecified ectopic pregnancy without intrauterine pregnancy: O00.90

## 2011-12-26 LAB — POCT URINALYSIS DIP (DEVICE)
Bilirubin Urine: NEGATIVE
Glucose, UA: NEGATIVE mg/dL
Ketones, ur: NEGATIVE mg/dL
Nitrite: NEGATIVE
pH: 7.5 (ref 5.0–8.0)

## 2011-12-26 LAB — CBC
HCT: 39.3 % (ref 36.0–46.0)
MCHC: 32.6 g/dL (ref 30.0–36.0)
RDW: 13.7 % (ref 11.5–15.5)
WBC: 11.9 10*3/uL — ABNORMAL HIGH (ref 4.0–10.5)

## 2011-12-26 LAB — COMPREHENSIVE METABOLIC PANEL
ALT: 9 U/L (ref 0–35)
AST: 12 U/L (ref 0–37)
Albumin: 4 g/dL (ref 3.5–5.2)
Alkaline Phosphatase: 96 U/L (ref 39–117)
Chloride: 104 mEq/L (ref 96–112)
Potassium: 4.2 mEq/L (ref 3.5–5.1)
Sodium: 138 mEq/L (ref 135–145)
Total Bilirubin: 0.2 mg/dL — ABNORMAL LOW (ref 0.3–1.2)
Total Protein: 6.7 g/dL (ref 6.0–8.3)

## 2011-12-26 LAB — WET PREP, GENITAL: Yeast Wet Prep HPF POC: NONE SEEN

## 2011-12-26 MED ORDER — METRONIDAZOLE 500 MG PO TABS: 500.0000 mg | ORAL_TABLET | Freq: Two times a day (BID) | ORAL | Status: AC

## 2011-12-26 NOTE — ED Provider Notes (Signed)
Chief Complaint  Patient presents with  . Pelvic Pain  . Urinary Tract Infection    History of Present Illness:   The patient is a 21 year old female with a five-day history of sharp left lower quadrant pain without radiation. The pain is worse if she such as her word and better if she sits up straight or takes a deep breath. She also notes some urinary frequency and cloudy urine but denies any dysuria or hematuria. She's had a slight discharge and odor. Her last menstrual period was July 8. She is sexually active. She uses condoms but not consistently. She's had some nausea but denies any breast swelling or tenderness. She had an ectopic pregnancy in April of this year. She thinks it was on her left side. She thinks the tube was removed. She denies any fever, chills, vomiting, or vaginal bleeding.  Review of Systems:  Other than noted above, the patient denies any of the following symptoms: Systemic:  No fever, chills, sweats, fatigue, or weight loss. GI:  No abdominal pain, nausea, anorexia, vomiting, diarrhea, constipation, melena or hematochezia. GU:  No dysuria, frequency, urgency, hematuria, vaginal discharge, itching, or abnormal vaginal bleeding. Skin:  No rash or itching.   PMFSH:  Past medical history, family history, social history, meds, and allergies were reviewed.  Physical Exam:   Vital signs:  BP 114/75  Pulse 64  Temp 99.1 F (37.3 C) (Oral)  Resp 16  SpO2 98%  LMP 12/15/2011 General:  Alert, oriented and in no distress. Lungs:  Breath sounds clear and equal bilaterally.  No wheezes, rales or rhonchi. Heart:  Regular rhythm.  No gallops or murmers. Abdomen:  Soft, flat and non-distended.  No organomegaly or mass.  No tenderness, guarding or rebound.  Bowel sounds normally active. Pelvic exam:  Normal external genitalia, there was some pink tinged vaginal discharge. No bleeding coming from the cervical os. The cervical os was closed. No cervical motion tenderness. Uterus  was normal in size and shape and nontender. No adnexal masses or tenderness. Skin:  Clear, warm and dry.  Labs:   Results for orders placed during the hospital encounter of 12/26/11  POCT URINALYSIS DIP (DEVICE)      Component Value Range   Glucose, UA NEGATIVE  NEGATIVE mg/dL   Bilirubin Urine NEGATIVE  NEGATIVE   Ketones, ur NEGATIVE  NEGATIVE mg/dL   Specific Gravity, Urine 1.020  1.005 - 1.030   Hgb urine dipstick LARGE (*) NEGATIVE   pH 7.5  5.0 - 8.0   Protein, ur 30 (*) NEGATIVE mg/dL   Urobilinogen, UA 0.2  0.0 - 1.0 mg/dL   Nitrite NEGATIVE  NEGATIVE   Leukocytes, UA SMALL (*) NEGATIVE  POCT PREGNANCY, URINE      Component Value Range   Preg Test, Ur POSITIVE (*) NEGATIVE     Assessment:  The encounter diagnosis was LLQ pain. It is possible this could be an ectopic pregnancy, a threatened abortion, salpingitis, or urinary tract infection. Obviously, ectopic is the one thing that needs to be ruled out the most, so she is going to go by private vehicle to [facility] hospital. I have called ahead and they're expecting her .   Plan:   1.  The following meds were prescribed:   New Prescriptions   No medications on file   2.  The patient was instructed in symptomatic care and handouts were given. 3.  The patient was told to return if becoming worse in any way, if no better  in 3 or 4 days, and given some red flag symptoms that would indicate earlier return.    Reuben Likes, MD 12/26/11 [phone]

## 2011-12-26 NOTE — ED Notes (Signed)
Pt c/o pelvic pain that shoots to the back. Pt c/o frequent urination and cloudy urine.

## 2011-12-26 NOTE — MAU Provider Note (Signed)
History     CSN: [id number]  Arrival date and time: 12/26/11 1505   First Provider Initiated Contact with Patient 12/26/11 1702      Chief Complaint  Patient presents with  . Pelvic Pain   HPI  The patient is a 21 year old female with a five-day history of sharp left lower quadrant pain without radiation. The pain is worse if she such as her word and better if she sits up straight or takes a deep breath. She also notes some urinary frequency and cloudy urine but denies any dysuria or hematuria. She's had a slight discharge and odor. Her last menstrual period was July 8. She is sexually active. She uses condoms but not consistently. She's had some nausea but denies any breast swelling or tenderness. She had an ectopic pregnancy in April of this year. She thinks it was on her left side. She thinks the tube was removed. She denies any fever, chills, vomiting, or vaginal bleeding.   Past Medical History  Diagnosis Date  . UTI (urinary tract infection)   . Asthma   . Ectopic pregnancy     Past Surgical History  Procedure Date  . Laparoscopy 09/13/2011    Procedure: LAPAROSCOPY OPERATIVE;  Surgeon: Tereso Newcomer, MD;  Location: [facility];  Service: Gynecology;  Laterality: Left;  operative laparoscopy left salpingectomy with removal of ectopic pregnancy.  . Ectopic pregnancy surgery     Family History  Problem Relation Age of Onset  . Anesthesia problems Neg Hx   . Hypotension Neg Hx   . Malignant hyperthermia Neg Hx   . Pseudochol deficiency Neg Hx     History  Substance Use Topics  . Smoking status: Current Everyday Smoker -- 1.0 packs/day  . Smokeless tobacco: Never Used  . Alcohol Use: No    Allergies:  Allergies  Allergen Reactions  . Contrast Media (Iodinated Diagnostic Agents) Anaphylaxis  . Latex Hives  . Other Anaphylaxis, Rash and Other (See Comments)    Seafood - throat swelling    Prescriptions prior to admission  Medication Sig Dispense Refill  .  ibuprofen (ADVIL,MOTRIN) 800 MG tablet Take 800 mg by mouth every 8 (eight) hours as needed.      Marland Kitchen acetaminophen (TYLENOL) 500 MG tablet Take 1,000 mg by mouth daily as needed. For pain        Review of Systems  Gastrointestinal: Positive for nausea and abdominal pain (midpelvic pain). Negative for vomiting.  All other systems reviewed and are negative.   Physical Exam   Blood pressure 117/65, pulse 68, temperature 98.4 F (36.9 C), temperature source Oral, resp. rate 16, height 5\' 5"  (1.651 m), weight 86.546 kg (190 lb 12.8 oz), last menstrual period 12/15/2011, SpO2 100.00%, unknown if currently breastfeeding.  Physical Exam  Constitutional: She is oriented to person, place, and time. She appears well-developed and well-nourished. No distress.  HENT:  Head: Normocephalic.  Neck: Normal range of motion. Neck supple.  Cardiovascular: Normal rate, regular rhythm and normal heart sounds.   Respiratory: Effort normal and breath sounds normal.  GI: Soft. She exhibits no mass. There is no tenderness. There is no rebound and no guarding.  Genitourinary: No bleeding around the vagina.  Neurological: She is alert and oriented to person, place, and time. She has normal reflexes.  Skin: Skin is warm and dry.    MAU Course  Procedures   Results for orders placed during the hospital encounter of 12/26/11 (from the past 24 hour(s))  CBC  Status: Abnormal   Collection Time   12/26/11  3:27 PM      Component Value Range   WBC 11.9 (*) 4.0 - 10.5 K/uL   RBC 4.22  3.87 - 5.11 MIL/uL   Hemoglobin 12.8  12.0 - 15.0 g/dL   HCT 13.2  44.0 - 10.2 %   MCV 93.1  78.0 - 100.0 fL   MCH 30.3  26.0 - 34.0 pg   MCHC 32.6  30.0 - 36.0 g/dL   RDW 72.5  36.6 - 44.0 %   Platelets 257  150 - 400 K/uL  COMPREHENSIVE METABOLIC PANEL     Status: Abnormal   Collection Time   12/26/11  3:27 PM      Component Value Range   Sodium 138  135 - 145 mEq/L   Potassium 4.2  3.5 - 5.1 mEq/L   Chloride 104  96 -  112 mEq/L   CO2 27  19 - 32 mEq/L   Glucose, Bld 88  70 - 99 mg/dL   BUN 10  6 - 23 mg/dL   Creatinine, Ser 3.47  0.50 - 1.10 mg/dL   Calcium 42.5  8.4 - 95.6 mg/dL   Total Protein 6.7  6.0 - 8.3 g/dL   Albumin 4.0  3.5 - 5.2 g/dL   AST 12  0 - 37 U/L   ALT 9  0 - 35 U/L   Alkaline Phosphatase 96  39 - 117 U/L   Total Bilirubin 0.2 (*) 0.3 - 1.2 mg/dL   GFR calc non Af Amer >90  >90 mL/min   GFR calc Af Amer >90  >90 mL/min  HCG, QUANTITATIVE, PREGNANCY     Status: Abnormal   Collection Time   12/26/11  3:27 PM      Component Value Range   hCG, Beta Chain, Quant, S 69 (*) <5 mIU/mL   Consulted with Dr. Emelda Fear > Dr. Emelda Fear reviewed charge, reviewed ultrasound images, examined patient > continue to monitor BHCGs with repeat US in one week.    Assessment and Plan  Pregnancy Pelvic Pain in Pregnancy  Plan: DC to home Ectopic precautions Return in 48 hours for repeat BHCG  [facility] 12/26/2011, 5:05 PM

## 2011-12-26 NOTE — MAU Note (Signed)
Patient was seen at Urgent Care for possible UTI. Had a positive pregnancy test and was sent to MAU for evaluation. Patient had an ectopic with surgery in April. No pain or bleeding at this time.

## 2011-12-27 LAB — GC/CHLAMYDIA PROBE AMP, GENITAL
Chlamydia, DNA Probe: NEGATIVE
GC Probe Amp, Genital: NEGATIVE

## 2011-12-28 ENCOUNTER — Inpatient Hospital Stay (HOSPITAL_COMMUNITY)
Admission: AD | Admit: 2011-12-28 | Discharge: 2011-12-28 | Disposition: A | Discharge status: Home / Self Care | Payer: Medicaid Other | Source: Ambulatory Visit | Attending: Obstetrics and Gynecology | Admitting: Obstetrics and Gynecology

## 2011-12-28 DIAGNOSIS — O200 Threatened abortion: Secondary | ICD-10-CM | POA: Insufficient documentation

## 2011-12-28 DIAGNOSIS — Z09 Encounter for follow-up examination after completed treatment for conditions other than malignant neoplasm: Secondary | ICD-10-CM

## 2011-12-28 LAB — URINE CULTURE

## 2011-12-28 NOTE — MAU Provider Note (Signed)
Cheyenne Walton is a 21 y.o. female who presents to MAU for follow up Bhcg. On previous visit the Bhcg was 69.   BP 113/74  Pulse 73  Temp 98.6 F (37 C) (Oral)  LMP 12/15/2011  Breastfeeding? Unknown   Results for orders placed during the hospital encounter of 12/28/11 (from the past 24 hour(s))  HCG, QUANTITATIVE, PREGNANCY     Status: Abnormal   Collection Time   12/28/11  1:31 PM      Component Value Range   hCG, Beta Chain, Quant, S 17 (*) <5 mIU/mL   Assessment: Falling Bhcg probably SAB  Plan:  Discussed with Dr. Emelda Fear and will have patient return in one week for follow up Bhcg to be sure the number goes to <2 due to                           her history of ectopic pregnancy.  I have reviewed this patient's vital signs, nurses notes, appropriate labs and imaging. I have discussed with the patient in detail plan of care. Patient voices understanding.

## 2011-12-28 NOTE — MAU Note (Signed)
Patient here for follow up bloodwork, no pain no vaginal bleeding.

## 2011-12-28 NOTE — ED Notes (Signed)
H.Neese,NP discussed results and POC to f/u in 1 week. With pt.

## 2011-12-28 NOTE — MAU Provider Note (Signed)
Attestation of Attending Supervision of Advanced Practitioner: Evaluation and management procedures were performed by the PA/NP/CNM/OB Fellow under my supervision/collaboration. Chart reviewed and agree with management and plan.  Wende Longstreth V 12/28/2011 1:07 PM

## 2011-12-29 NOTE — MAU Provider Note (Signed)
Attestation of Attending Supervision of Advanced Practitioner: Evaluation and management procedures were performed by the PA/NP/CNM/OB Fellow under my supervision/collaboration. Chart reviewed and agree with management and plan.  Ulysse Siemen V 12/29/2011 2:42 AM

## 2011-12-29 NOTE — ED Notes (Signed)
GC/Chlamydia neg., Wet prep: mod. clue cells, few WBC's, Urine culture: >100,000 colonies Staph. species ( coagulase negative).  Pt. was transferred to [facility] and was treated adequately with Flagyl.  I showed Dr. Lorenz Coaster the labs. He said no further treatment for the urine culture-states it was a contaminant in the urine. Vassie Moselle 12/29/2011

## 2012-01-04 ENCOUNTER — Inpatient Hospital Stay (HOSPITAL_COMMUNITY)
Admission: AD | Admit: 2012-01-04 | Discharge: 2012-01-04 | Disposition: A | Discharge status: Home / Self Care | Payer: Medicaid Other | Source: Ambulatory Visit | Attending: Obstetrics & Gynecology | Admitting: Obstetrics & Gynecology

## 2012-01-04 DIAGNOSIS — O99891 Other specified diseases and conditions complicating pregnancy: Secondary | ICD-10-CM | POA: Insufficient documentation

## 2012-01-04 DIAGNOSIS — N949 Unspecified condition associated with female genital organs and menstrual cycle: Secondary | ICD-10-CM | POA: Insufficient documentation

## 2012-01-04 DIAGNOSIS — Z09 Encounter for follow-up examination after completed treatment for conditions other than malignant neoplasm: Secondary | ICD-10-CM

## 2012-01-04 NOTE — MAU Note (Signed)
Patient here for BHCG denies bleeding denies pain.

## 2012-01-04 NOTE — Progress Notes (Signed)
S:  Pt here for follow-up BHCG; first seen on 12/26/11 for pelvic pain.  Beta HCG was 69; ultrasound showed a right adnexal mass, no free fluid.  Pt history of left ectopic in April 2013 treated via salpingectomy.  Due to low BHCG it was decided to follow BHCG without medical/surgical intervention.  Pt returned on 12/28/11 and BHCG was 17.  Pt here with no pelvic pain or bleeding.  Past Medical History  Diagnosis Date  . UTI (urinary tract infection)   . Asthma   . Ectopic pregnancy    Past Surgical History  Procedure Date  . Laparoscopy 09/13/2011    Procedure: LAPAROSCOPY OPERATIVE;  Surgeon: Tereso Newcomer, MD;  Location: [facility];  Service: Gynecology;  Laterality: Left;  operative laparoscopy left salpingectomy with removal of ectopic pregnancy.  . Ectopic pregnancy surgery     O: Filed Vitals:   01/04/12 1054  BP: 124/69  Pulse: 68  Temp: 98.2 F (36.8 C)   General: alert, cooperative and appears stated age; NAD  A: Follow-up  P: DC to home Follow-up prn [facility]

## 2012-03-23 ENCOUNTER — Emergency Department (INDEPENDENT_AMBULATORY_CARE_PROVIDER_SITE_OTHER)
Admission: EM | Admit: 2012-03-23 | Discharge: 2012-03-23 | Disposition: A | Discharge status: Home / Self Care | Payer: Medicaid Other | Source: Home / Self Care | Attending: Family Medicine | Admitting: Family Medicine

## 2012-03-23 ENCOUNTER — Encounter (HOSPITAL_COMMUNITY): Payer: Self-pay | Admitting: Emergency Medicine

## 2012-03-23 DIAGNOSIS — A5400 Gonococcal infection of lower genitourinary tract, unspecified: Secondary | ICD-10-CM

## 2012-03-23 DIAGNOSIS — A549 Gonococcal infection, unspecified: Secondary | ICD-10-CM

## 2012-03-23 MED ORDER — CEFTRIAXONE SODIUM 1 G IJ SOLR
250.0000 mg | Freq: Once | INTRAMUSCULAR | Status: AC
  Administered 2012-03-23: 250 mg via INTRAMUSCULAR

## 2012-03-23 MED ORDER — LIDOCAINE HCL (PF) 1 % IJ SOLN
INTRAMUSCULAR | Status: AC
  Filled 2012-03-23: qty 5

## 2012-03-23 MED ORDER — CEFTRIAXONE SODIUM 250 MG IJ SOLR
INTRAMUSCULAR | Status: AC
  Filled 2012-03-23: qty 250

## 2012-03-23 MED ORDER — AZITHROMYCIN 250 MG PO TABS
1000.0000 mg | ORAL_TABLET | Freq: Once | ORAL | Status: AC
  Administered 2012-03-23: 1000 mg via ORAL

## 2012-03-23 MED ORDER — AZITHROMYCIN 250 MG PO TABS
ORAL_TABLET | ORAL | Status: AC
  Filled 2012-03-23: qty 4

## 2012-03-23 NOTE — ED Provider Notes (Signed)
History     CSN: [id number]  Arrival date & time 03/23/12  1700   First MD Initiated Contact with Patient 03/23/12 1748      Chief Complaint  Patient presents with  . Exposure to STD    (Consider location/radiation/quality/duration/timing/severity/associated sxs/prior treatment) Patient is a 21 y.o. female presenting with STD exposure. The history is provided by the patient and a parent.  Exposure to STD This is a new problem. The current episode started 6 to 12 hours ago (told from health dept that she tested pos for GC, and can come to [facility] for treatment, pt with no sx.). Pertinent negatives include no abdominal pain.    Past Medical History  Diagnosis Date  . UTI (urinary tract infection)   . Asthma   . Ectopic pregnancy     Past Surgical History  Procedure Date  . Laparoscopy 09/13/2011    Procedure: LAPAROSCOPY OPERATIVE;  Surgeon: Tereso Newcomer, MD;  Location: [facility];  Service: Gynecology;  Laterality: Left;  operative laparoscopy left salpingectomy with removal of ectopic pregnancy.  . Ectopic pregnancy surgery     Family History  Problem Relation Age of Onset  . Anesthesia problems Neg Hx   . Hypotension Neg Hx   . Malignant hyperthermia Neg Hx   . Pseudochol deficiency Neg Hx     History  Substance Use Topics  . Smoking status: Current Every Day Smoker -- 1.0 packs/day  . Smokeless tobacco: Never Used  . Alcohol Use: No    OB History    Grav Para Term Preterm Abortions TAB SAB Ect Mult Living   2 0 0 0 1 0 0 1 0 0       Review of Systems  Constitutional: Negative.   Gastrointestinal: Negative.  Negative for abdominal pain.  Genitourinary: Negative for vaginal bleeding, vaginal discharge, vaginal pain, menstrual problem and pelvic pain.    Allergies  Contrast media; Latex; and Other  Home Medications   Current Outpatient Rx  Name Route Sig Dispense Refill  . METRONIDAZOLE ER PO Oral Take by mouth.    . ACETAMINOPHEN 500 MG PO TABS Oral Take  1,000 mg by mouth daily as needed. For pain    . IBUPROFEN 800 MG PO TABS Oral Take 800 mg by mouth every 8 (eight) hours as needed.      BP 93/54  Pulse 70  Temp 98.9 F (37.2 C) (Oral)  Resp 16  SpO2 100%  LMP 03/09/2012  Breastfeeding? No  Physical Exam  Nursing note and vitals reviewed. Constitutional: She is oriented to person, place, and time. She appears well-developed and well-nourished.  Abdominal: Soft. Bowel sounds are normal. She exhibits no distension. There is no tenderness. There is no rebound.  Neurological: She is alert and oriented to person, place, and time.  Skin: Skin is warm and dry.    ED Course  Procedures (including critical care time)  Labs Reviewed - No data to display No results found.   1. Gonorrhea in female       MDM          Linna Hoff, MD 03/23/12 [phone]

## 2012-03-23 NOTE — ED Notes (Signed)
Reports positive for std, requesting treatment for std

## 2012-04-03 ENCOUNTER — Emergency Department (INDEPENDENT_AMBULATORY_CARE_PROVIDER_SITE_OTHER)
Admission: EM | Admit: 2012-04-03 | Discharge: 2012-04-03 | Disposition: A | Discharge status: Home / Self Care | Payer: Medicaid Other | Source: Home / Self Care | Attending: Emergency Medicine | Admitting: Emergency Medicine

## 2012-04-03 ENCOUNTER — Encounter (HOSPITAL_COMMUNITY): Payer: Self-pay | Admitting: Emergency Medicine

## 2012-04-03 DIAGNOSIS — J069 Acute upper respiratory infection, unspecified: Secondary | ICD-10-CM

## 2012-04-03 DIAGNOSIS — J020 Streptococcal pharyngitis: Secondary | ICD-10-CM

## 2012-04-03 DIAGNOSIS — J029 Acute pharyngitis, unspecified: Secondary | ICD-10-CM

## 2012-04-03 MED ORDER — GUAIFENESIN-CODEINE 100-10 MG/5ML PO SYRP: 5.0000 mL | ORAL_SOLUTION | Freq: Three times a day (TID) | ORAL | Status: DC | PRN

## 2012-04-03 MED ORDER — CETIRIZINE-PSEUDOEPHEDRINE ER 5-120 MG PO TB12: 1.0000 | ORAL_TABLET | Freq: Two times a day (BID) | ORAL | Status: AC

## 2012-04-03 MED ORDER — AMOXICILLIN 500 MG PO CAPS: 500.0000 mg | ORAL_CAPSULE | Freq: Three times a day (TID) | ORAL | Status: AC

## 2012-04-03 MED ORDER — CETIRIZINE-PSEUDOEPHEDRINE ER 5-120 MG PO TB12: 1.0000 | ORAL_TABLET | Freq: Two times a day (BID) | ORAL | Status: DC

## 2012-04-03 NOTE — ED Notes (Signed)
Pt c/o cold sx x1 day... Sx include: sore throat, SOB, dry cough, sneezing, nasal congestion, dysphagia... Denies: fevers, vomiting, nausea, diarrhea, ear pain... Pt is alert w/no signs of distress.

## 2012-04-03 NOTE — ED Provider Notes (Signed)
History     CSN: [id number]  Arrival date & time 04/03/12  1527   First MD Initiated Contact with Patient 04/03/12 1529      Chief Complaint  Patient presents with  . URI    (Consider location/radiation/quality/duration/timing/severity/associated sxs/prior treatment) HPI Comments: Patient describes a for about a week she's been having a sore throat has been congested her nose and having some cough. Has been sneezing a lot having difficulty and pain when she swallows. Yesterday she felt some phlegm in her throat when she tried to get it out she saw some streaks of blood in some brownish-looking mucus. Patient denies any abdominal pain, headaches myalgias arthralgias.  The history is provided by the patient.    Past Medical History  Diagnosis Date  . UTI (urinary tract infection)   . Asthma   . Ectopic pregnancy     Past Surgical History  Procedure Date  . Laparoscopy 09/13/2011    Procedure: LAPAROSCOPY OPERATIVE;  Surgeon: Tereso Newcomer, MD;  Location: [facility];  Service: Gynecology;  Laterality: Left;  operative laparoscopy left salpingectomy with removal of ectopic pregnancy.  . Ectopic pregnancy surgery     Family History  Problem Relation Age of Onset  . Anesthesia problems Neg Hx   . Hypotension Neg Hx   . Malignant hyperthermia Neg Hx   . Pseudochol deficiency Neg Hx     History  Substance Use Topics  . Smoking status: Current Every Day Smoker -- 1.0 packs/day  . Smokeless tobacco: Never Used  . Alcohol Use: No    OB History    Grav Para Term Preterm Abortions TAB SAB Ect Mult Living   2 0 0 0 1 0 0 1 0 0       Review of Systems  Constitutional: Negative for fever, chills, activity change and appetite change.  HENT: Positive for congestion, sore throat, postnasal drip and sinus pressure. Negative for trouble swallowing, neck pain, neck stiffness, voice change and ear discharge.   Respiratory: Positive for cough and shortness of breath.   Genitourinary:  Negative for dysuria.  Musculoskeletal: Negative for back pain and arthralgias.  Skin: Negative for rash.    Allergies  Contrast media; Latex; and Other  Home Medications   Current Outpatient Rx  Name Route Sig Dispense Refill  . AMOXICILLIN 500 MG PO CAPS Oral Take 1 capsule (500 mg total) by mouth 3 (three) times daily. 30 capsule 0  . CETIRIZINE-PSEUDOEPHEDRINE ER 5-120 MG PO TB12 Oral Take 1 tablet by mouth 2 (two) times daily. 14 tablet 0  . GUAIFENESIN-CODEINE 100-10 MG/5ML PO SYRP Oral Take 5 mLs by mouth 3 (three) times daily as needed for cough. 120 mL 0  . METRONIDAZOLE ER PO Oral Take by mouth.      BP 108/59  Pulse 80  Temp 98.6 F (37 C) (Oral)  Resp 18  SpO2 100%  LMP 03/13/2012  Breastfeeding? No  Physical Exam  Nursing note and vitals reviewed. Constitutional: She appears well-developed and well-nourished.  HENT:  Head: Normocephalic.  Right Ear: Tympanic membrane normal.  Left Ear: Tympanic membrane normal.  Mouth/Throat: Uvula is midline and mucous membranes are normal. Posterior oropharyngeal erythema present. No oropharyngeal exudate, posterior oropharyngeal edema or tonsillar abscesses.  Eyes: Conjunctivae normal are normal. Right eye exhibits no discharge. Left eye exhibits no discharge.  Neck: Neck supple. No JVD present.  Cardiovascular: Normal rate.  Exam reveals no gallop and no friction rub.   No murmur heard. Pulmonary/Chest: Effort normal  and breath sounds normal.  Abdominal: Soft.  Lymphadenopathy:    She has no cervical adenopathy.  Neurological: She is alert.  Skin: No rash noted. No erythema.    ED Course  Procedures (including critical care time)  Labs Reviewed  POCT RAPID STREP A ([facility] ONLY) - Abnormal; Notable for the following:    Streptococcus, Group A Screen (Direct) POSITIVE (*)     All other components within normal limits   No results found.   1. Upper respiratory infection   2. Pharyngitis   3. Strep  pharyngitis       MDM   Symptoms and exam consistent with an upper respiratory infection. Patient prescribe codeine with  Guanafessein. Patient is a positive for strep. She was prescribed Zyrtec-D, with low with amoxicillin for 10 days. Uncomplicated streptococcal pharyngitis coexistent with an upper respiratory infection.l       Jimmie Molly, MD 04/03/12 [phone]

## 2012-05-21 ENCOUNTER — Inpatient Hospital Stay (HOSPITAL_COMMUNITY)
Admission: AD | Admit: 2012-05-21 | Discharge: 2012-05-21 | Disposition: A | Discharge status: Home / Self Care | Payer: Medicaid Other | Source: Ambulatory Visit | Attending: Obstetrics and Gynecology | Admitting: Obstetrics and Gynecology

## 2012-05-21 ENCOUNTER — Encounter (HOSPITAL_COMMUNITY): Payer: Self-pay | Admitting: *Deleted

## 2012-05-21 DIAGNOSIS — Z3201 Encounter for pregnancy test, result positive: Secondary | ICD-10-CM

## 2012-05-21 DIAGNOSIS — Z3490 Encounter for supervision of normal pregnancy, unspecified, unspecified trimester: Secondary | ICD-10-CM

## 2012-05-21 DIAGNOSIS — O99891 Other specified diseases and conditions complicating pregnancy: Secondary | ICD-10-CM | POA: Insufficient documentation

## 2012-05-21 DIAGNOSIS — R109 Unspecified abdominal pain: Secondary | ICD-10-CM | POA: Insufficient documentation

## 2012-05-21 LAB — URINALYSIS, ROUTINE W REFLEX MICROSCOPIC
Bilirubin Urine: NEGATIVE
Glucose, UA: NEGATIVE mg/dL
Hgb urine dipstick: NEGATIVE
Ketones, ur: NEGATIVE mg/dL
Leukocytes, UA: NEGATIVE
Protein, ur: NEGATIVE mg/dL

## 2012-05-21 MED ORDER — PRENATAL VITAMINS 28-0.8 MG PO TABS: 1.0000 | ORAL_TABLET | Freq: Every day | ORAL | Status: DC

## 2012-05-21 NOTE — MAU Note (Signed)
Having some abdominal cramping since last night. May be due to being constipated. Have had BM today but hard to go

## 2012-05-21 NOTE — MAU Provider Note (Signed)
History     CSN: [id number]  Arrival date and time: 05/21/12 1906   First Provider Initiated Contact with Patient 05/21/12 2027      Chief Complaint  Patient presents with  . Abdominal Cramping   HPI  Cheyenne Walton is a 21 y.o. G3P0010 who presents today with abdominal cramping. She states that she has had a SAB and an ectopic pregnancy in the past, and she needs a note to clear her for work. She has been having some cramps since last night that she rates 1/10 on a pain scale. She denies any bleeding. LMP: 04/08/12. This makes her 6 weeks 1 day.   Past Medical History  Diagnosis Date  . UTI (urinary tract infection)   . Asthma   . Ectopic pregnancy     Past Surgical History  Procedure Date  . Laparoscopy 09/13/2011    Procedure: LAPAROSCOPY OPERATIVE;  Surgeon: Tereso Newcomer, MD;  Location: [facility];  Service: Gynecology;  Laterality: Left;  operative laparoscopy left salpingectomy with removal of ectopic pregnancy.  . Ectopic pregnancy surgery   . No past surgeries     Family History  Problem Relation Age of Onset  . Anesthesia problems Neg Hx   . Hypotension Neg Hx   . Malignant hyperthermia Neg Hx   . Pseudochol deficiency Neg Hx     History  Substance Use Topics  . Smoking status: Former Smoker -- 1.0 packs/day  . Smokeless tobacco: Never Used  . Alcohol Use: No    Allergies:  Allergies  Allergen Reactions  . Contrast Media (Iodinated Diagnostic Agents) Anaphylaxis  . Latex Hives  . Other Anaphylaxis, Rash and Other (See Comments)    Seafood - throat swelling    Prescriptions prior to admission  Medication Sig Dispense Refill  . bismuth subsalicylate (PEPTO BISMOL) 262 MG chewable tablet Chew 524 mg by mouth once.      Marland Kitchen PRESCRIPTION MEDICATION Place 1 application vaginally at bedtime. Pt say she used a cream for yeast infection (did not know the name or strength) she used for 7 days.        Review of Systems  Constitutional: Negative for fever  and chills.  Gastrointestinal: Positive for nausea and constipation. Negative for vomiting, abdominal pain and diarrhea.  Genitourinary: Negative for dysuria, urgency and frequency.  Neurological: Negative for headaches.   Physical Exam   Blood pressure 128/76, pulse 83, temperature 98.1 F (36.7 C), temperature source Oral, resp. rate 20, height 5\' 5"  (1.651 m), weight 88.451 kg (195 lb), last menstrual period 04/08/2012, not currently breastfeeding.  Physical Exam  Nursing note and vitals reviewed. Constitutional: She is oriented to person, place, and time. She appears well-developed and well-nourished. No distress.  Cardiovascular: Normal rate.   Respiratory: Effort normal.  GI: Soft. She exhibits no distension. There is no tenderness.  Genitourinary:        Bedside ultrasound shows a small IUP with cardiac activity.    Neurological: She is alert and oriented to person, place, and time.  Skin: Skin is warm and dry.    MAU Course  Procedures    Assessment and Plan   1. Pregnancy     Laretta, Pyatt  Home Medication Instructions [id number]   Printed on:05/21/12 2049  Medication Information                    PRESCRIPTION MEDICATION Place 1 application vaginally at bedtime. Pt say she used a cream for yeast infection (did not  know the name or strength) she used for 7 days.           Prenatal Vit-Fe Fumarate-FA (PRENATAL VITAMINS) 28-0.8 MG TABS Take 1 tablet by mouth daily.            Start prenatal care as soon as possible Return to MAU with any concerns First trimester danger signs reviewed Tawnya Crook 05/21/2012, 8:28 PM

## 2012-05-25 NOTE — MAU Provider Note (Signed)
Attestation of Attending Supervision of Advanced Practitioner (CNM/NP): Evaluation and management procedures were performed by the Advanced Practitioner under my supervision and collaboration.  I have reviewed the Advanced Practitioner's note and chart, and I agree with the management and plan.  Yuriko Portales 05/25/2012 6:19 PM

## 2012-05-30 ENCOUNTER — Encounter (HOSPITAL_COMMUNITY): Payer: Self-pay | Admitting: *Deleted

## 2012-05-30 ENCOUNTER — Inpatient Hospital Stay (HOSPITAL_COMMUNITY)
Admission: AD | Admit: 2012-05-30 | Discharge: 2012-05-30 | Disposition: A | Discharge status: Home / Self Care | Payer: Medicaid Other | Source: Ambulatory Visit | Attending: Obstetrics | Admitting: Obstetrics

## 2012-05-30 ENCOUNTER — Inpatient Hospital Stay (HOSPITAL_COMMUNITY): Payer: Medicaid Other

## 2012-05-30 DIAGNOSIS — Z298 Encounter for other specified prophylactic measures: Secondary | ICD-10-CM | POA: Insufficient documentation

## 2012-05-30 DIAGNOSIS — Z2989 Encounter for other specified prophylactic measures: Secondary | ICD-10-CM | POA: Insufficient documentation

## 2012-05-30 DIAGNOSIS — O039 Complete or unspecified spontaneous abortion without complication: Secondary | ICD-10-CM | POA: Insufficient documentation

## 2012-05-30 DIAGNOSIS — O200 Threatened abortion: Secondary | ICD-10-CM

## 2012-05-30 LAB — CBC
HCT: 36.7 % (ref 36.0–46.0)
MCH: 31 pg (ref 26.0–34.0)
MCHC: 33.5 g/dL (ref 30.0–36.0)
MCV: 92.4 fL (ref 78.0–100.0)
Platelets: 228 10*3/uL (ref 150–400)
RDW: 13.2 % (ref 11.5–15.5)
WBC: 7.5 10*3/uL (ref 4.0–10.5)

## 2012-05-30 LAB — URINE MICROSCOPIC-ADD ON

## 2012-05-30 LAB — URINALYSIS, ROUTINE W REFLEX MICROSCOPIC
Bilirubin Urine: NEGATIVE
Glucose, UA: NEGATIVE mg/dL
Specific Gravity, Urine: 1.025 (ref 1.005–1.030)
pH: 6 (ref 5.0–8.0)

## 2012-05-30 MED ORDER — RHO D IMMUNE GLOBULIN 1500 UNIT/2ML IJ SOLN: 300.0000 ug | Freq: Once | INTRAMUSCULAR | Status: DC

## 2012-05-30 MED ORDER — RHO D IMMUNE GLOBULIN 1500 UNIT/2ML IJ SOLN
300.0000 ug | Freq: Once | INTRAMUSCULAR | Status: AC
  Administered 2012-05-30: 300 ug via INTRAMUSCULAR

## 2012-05-30 NOTE — MAU Provider Note (Signed)
History     CSN: [id number]  Arrival date and time: 05/30/12 4098   None     Chief Complaint  Patient presents with  . Vaginal Bleeding   HPI 21 y.o. G3P0020 at [redacted]w[redacted]d with pink spotting last night, no pain. No bleeding today. H/O SAB and ectopic. Informal bedside u/s on 12/13 in MAU showed small IUP with + FHR.    Past Medical History  Diagnosis Date  . UTI (urinary tract infection)   . Ectopic pregnancy   . Asthma     "haven't used an inhaler in years"    Past Surgical History  Procedure Date  . Laparoscopy 09/13/2011    Procedure: LAPAROSCOPY OPERATIVE;  Surgeon: Tereso Newcomer, MD;  Location: [facility];  Service: Gynecology;  Laterality: Left;  operative laparoscopy left salpingectomy with removal of ectopic pregnancy.  . Ectopic pregnancy surgery   . Laparoscopic unilateral salpingectomy     LEFT SIDE with ectopic pregnancy    Family History  Problem Relation Age of Onset  . Anesthesia problems Neg Hx   . Hypotension Neg Hx   . Malignant hyperthermia Neg Hx   . Pseudochol deficiency Neg Hx   . Alcohol abuse Neg Hx   . Arthritis Neg Hx   . Birth defects Neg Hx   . Asthma Neg Hx   . Cancer Neg Hx   . COPD Neg Hx   . Depression Neg Hx   . Diabetes Neg Hx   . Drug abuse Neg Hx   . Early death Neg Hx   . Heart disease Neg Hx   . Hearing loss Neg Hx   . Hyperlipidemia Neg Hx   . Hypertension Neg Hx   . Kidney disease Neg Hx   . Learning disabilities Neg Hx   . Mental illness Neg Hx   . Mental retardation Neg Hx   . Miscarriages / Stillbirths Neg Hx   . Stroke Neg Hx   . Vision loss Neg Hx     History  Substance Use Topics  . Smoking status: Former Smoker -- 1.0 packs/day    Quit date: 04/08/2012  . Smokeless tobacco: Never Used  . Alcohol Use: No    Allergies:  Allergies  Allergen Reactions  . Contrast Media (Iodinated Diagnostic Agents) Anaphylaxis  . Other Anaphylaxis, Rash and Other (See Comments)    Seafood - throat swelling  . Latex Hives     Prescriptions prior to admission  Medication Sig Dispense Refill  . Prenatal Vit-Fe Fumarate-FA (PRENATAL VITAMINS) 28-0.8 MG TABS Take 1 tablet by mouth daily.  30 tablet  2    Review of Systems  Constitutional: Negative.   Respiratory: Negative.   Cardiovascular: Negative.   Gastrointestinal: Negative for nausea, vomiting, abdominal pain, diarrhea and constipation.  Genitourinary: Negative for dysuria, urgency, frequency, hematuria and flank pain.       Negative for vaginal bleeding, vaginal discharge, dyspareunia  Musculoskeletal: Negative.   Neurological: Negative.   Psychiatric/Behavioral: Negative.    Physical Exam   Blood pressure 106/56, pulse 65, temperature 98.2 F (36.8 C), temperature source Oral, resp. rate 18, height 5\' 4"  (1.626 m), weight 192 lb 6.4 oz (87.272 kg), last menstrual period 04/08/2012.  Physical Exam  Nursing note and vitals reviewed. Constitutional: She is oriented to person, place, and time. She appears well-developed and well-nourished. No distress.  Cardiovascular: Normal rate.   Respiratory: Effort normal.  GI: Soft. There is no tenderness.  Musculoskeletal: Normal range of motion.  Neurological: She  is alert and oriented to person, place, and time.  Skin: Skin is warm and dry.  Psychiatric: She has a normal mood and affect.    MAU Course  Procedures Results for orders placed during the hospital encounter of 05/30/12 (from the past 24 hour(s))  CBC     Status: Normal   Collection Time   05/30/12 10:33 AM      Component Value Range   WBC 7.5  4.0 - 10.5 K/uL   RBC 3.97  3.87 - 5.11 MIL/uL   Hemoglobin 12.3  12.0 - 15.0 g/dL   HCT 16.1  09.6 - 04.5 %   MCV 92.4  78.0 - 100.0 fL   MCH 31.0  26.0 - 34.0 pg   MCHC 33.5  30.0 - 36.0 g/dL   RDW 40.9  81.1 - 91.4 %   Platelets 228  150 - 400 K/uL  RH IG WORKUP (INCLUDES ABO/RH)     Status: Normal (Preliminary result)   Collection Time   05/30/12 10:33 AM      Component Value Range    Gestational Age(Wks) 7.3     ABO/RH(D) A NEG     Antibody Screen NEG     Unit Number [id number]     Blood Component Type RHIG     Unit division 00     Status of Unit ISSUED     Transfusion Status OK TO TRANSFUSE    URINALYSIS, ROUTINE W REFLEX MICROSCOPIC     Status: Abnormal   Collection Time   05/30/12 11:37 AM      Component Value Range   Color, Urine YELLOW  YELLOW   APPearance CLOUDY (*) CLEAR   Specific Gravity, Urine 1.025  1.005 - 1.030   pH 6.0  5.0 - 8.0   Glucose, UA NEGATIVE  NEGATIVE mg/dL   Hgb urine dipstick LARGE (*) NEGATIVE   Bilirubin Urine NEGATIVE  NEGATIVE   Ketones, ur NEGATIVE  NEGATIVE mg/dL   Protein, ur NEGATIVE  NEGATIVE mg/dL   Urobilinogen, UA 0.2  0.0 - 1.0 mg/dL   Nitrite NEGATIVE  NEGATIVE   Leukocytes, UA TRACE (*) NEGATIVE  URINE MICROSCOPIC-ADD ON     Status: Abnormal   Collection Time   05/30/12 11:37 AM      Component Value Range   Squamous Epithelial / LPF MANY (*) RARE   WBC, UA 3-6  <3 WBC/hpf   RBC / HPF 7-10  <3 RBC/hpf   Bacteria, UA MANY (*) RARE   Urine-Other MUCOUS PRESENT     No FHR visualized on bedside u/s.   US Ob Comp Less 14 Wks  05/30/2012  *RADIOLOGY REPORT*  Clinical Data: Previous spontaneous abortion.  There is trimester pregnancy.  Spotting.  OBSTETRIC <14 WK [place] AND TRANSVAGINAL OB US  Technique:  Both transabdominal and transvaginal ultrasound examinations were performed for complete evaluation of the gestation as well as the maternal uterus, adnexal regions, and pelvic cul-de-sac.  Transvaginal technique was performed to assess early pregnancy.  Comparison:  Fall ultrasound 12/26/2011.  Intrauterine gestational sac:  A slightly irregular gestational sac is noted within the fundus of the uterus, measuring 8.7 x 4.3 of 6.2 mm. Yolk sac: Not visualized Embryo: Not visualized Cardiac Activity: Not visualized  MSD: 6.4 mm mm  5 w 2 d  Maternal uterus/adnexae: A prominent subchorionic hemorrhage is present.  The ovaries  are within normal limits bilaterally.  IMPRESSION:  1. Empty irregular gestational sac with a prominent subchorionic hemorrhage. By LMP, the  gestational age is 7 weeks 3 days.  If this LMP is accurate, an embryo should be present.  Findings are suspicious but not yet definitive for failed pregnancy.  Recommend follow-up US in 10-14 days for definitive diagnosis.  This recommendation follows SRU consensus guidelines: Diagnostic Criteria for Nonviable Pregnancy Early in the First Trimester.  Malva Limes Med 2013; 161:0960-45.   Original Report Authenticated By: Marin Roberts, M.D.    US Ob Transvaginal  05/30/2012  *RADIOLOGY REPORT*  Clinical Data: Previous spontaneous abortion.  There is trimester pregnancy.  Spotting.  OBSTETRIC <14 WK [place] AND TRANSVAGINAL OB US  Technique:  Both transabdominal and transvaginal ultrasound examinations were performed for complete evaluation of the gestation as well as the maternal uterus, adnexal regions, and pelvic cul-de-sac.  Transvaginal technique was performed to assess early pregnancy.  Comparison:  Fall ultrasound 12/26/2011.  Intrauterine gestational sac:  A slightly irregular gestational sac is noted within the fundus of the uterus, measuring 8.7 x 4.3 of 6.2 mm. Yolk sac: Not visualized Embryo: Not visualized Cardiac Activity: Not visualized  MSD: 6.4 mm mm  5 w 2 d  Maternal uterus/adnexae: A prominent subchorionic hemorrhage is present.  The ovaries are within normal limits bilaterally.  IMPRESSION:  1. Empty irregular gestational sac with a prominent subchorionic hemorrhage. By LMP, the gestational age is 7 weeks 3 days.  If this LMP is accurate, an embryo should be present.  Findings are suspicious but not yet definitive for failed pregnancy.  Recommend follow-up US in 10-14 days for definitive diagnosis.  This recommendation follows SRU consensus guidelines: Diagnostic Criteria for Nonviable Pregnancy Early in the First Trimester.  Malva Limes Med 2013;  409:8119-14.   Original Report Authenticated By: Marin Roberts, M.D.     Assessment and Plan  Likely SAB - Rhogam given in MAU today, f/u with Dr. Tamela Oddi in office tomorrow, precautions rev'd    Medication List     As of 05/30/2012  1:04 PM    CONTINUE taking these medications         Prenatal Vitamins 28-0.8 MG Tabs   Take 1 tablet by mouth daily.         Follow-up Information    Follow up with Roseanna Rainbow, MD. On 05/31/2012. (10 AM)    Contact information:   [address], [address] [place] [address] [phone]            Georges Mouse 05/30/2012, 1:04 PM

## 2012-05-30 NOTE — MAU Note (Signed)
Pt reports  Last night had a little pink discharge last night. No c/o pain or cramping.

## 2012-05-30 NOTE — MAU Note (Signed)
"  Last night I had some pink d/c.  Today the d/c is no longer pink, it's a milky white color.  No pain was ever associated with this pink d/c."

## 2012-05-31 LAB — RH IG WORKUP (INCLUDES ABO/RH)
Antibody Screen: NEGATIVE
Unit division: 0

## 2012-06-01 LAB — URINE CULTURE: Colony Count: 25000

## 2012-06-17 ENCOUNTER — Emergency Department (INDEPENDENT_AMBULATORY_CARE_PROVIDER_SITE_OTHER)
Admission: EM | Admit: 2012-06-17 | Discharge: 2012-06-17 | Disposition: A | Discharge status: Home / Self Care | Payer: Medicaid Other | Source: Home / Self Care

## 2012-06-17 ENCOUNTER — Encounter (HOSPITAL_COMMUNITY): Payer: Self-pay | Admitting: Emergency Medicine

## 2012-06-17 DIAGNOSIS — J029 Acute pharyngitis, unspecified: Secondary | ICD-10-CM

## 2012-06-17 LAB — POCT RAPID STREP A: Streptococcus, Group A Screen (Direct): NEGATIVE

## 2012-06-17 IMAGING — US US OB TRANSVAGINAL
1 series · 14 of 28 positions shown · non-contrast
Comparison: None

CLINICAL DATA: Abnormal rising Beta HCG.

TRANSVAGINAL OB ULTRASOUND
TECHNIQUE: Transvaginal ultrasound was performed for evaluation of
the gestation as well as the maternal uterus and adnexal regions.

[Series 1: us ob transvaginal · 14 of 41 slices shown]
[im 2/41]
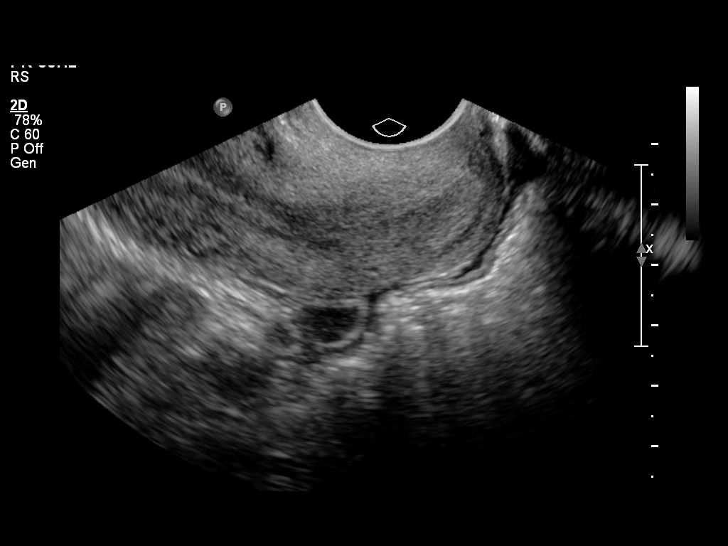
[im 5/41]
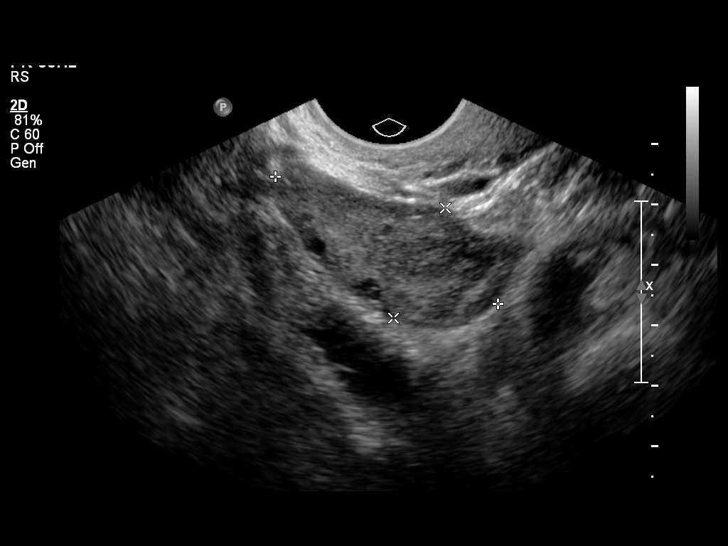
[im 8/41]
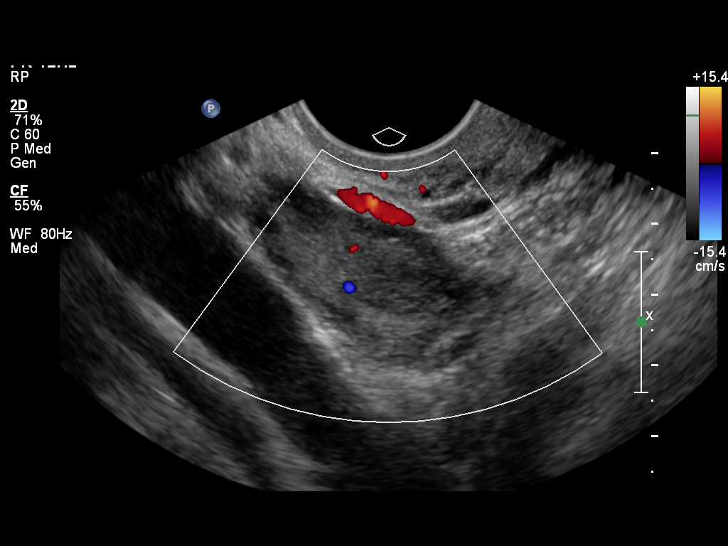
[im 11/41]
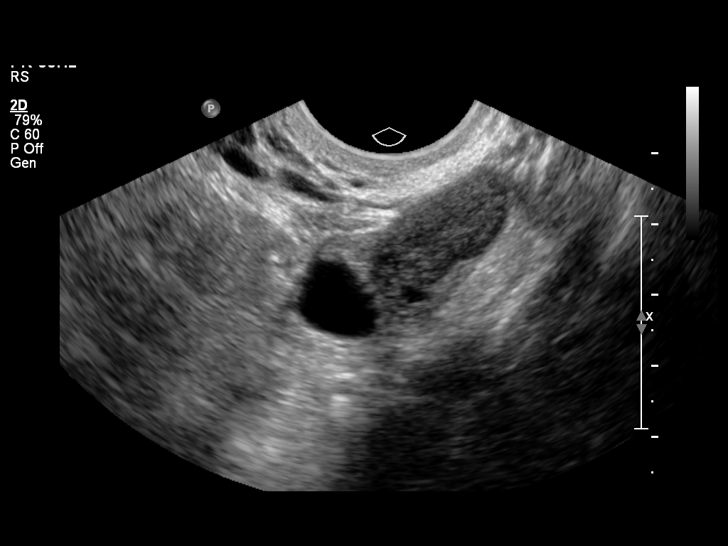
[im 14/41]
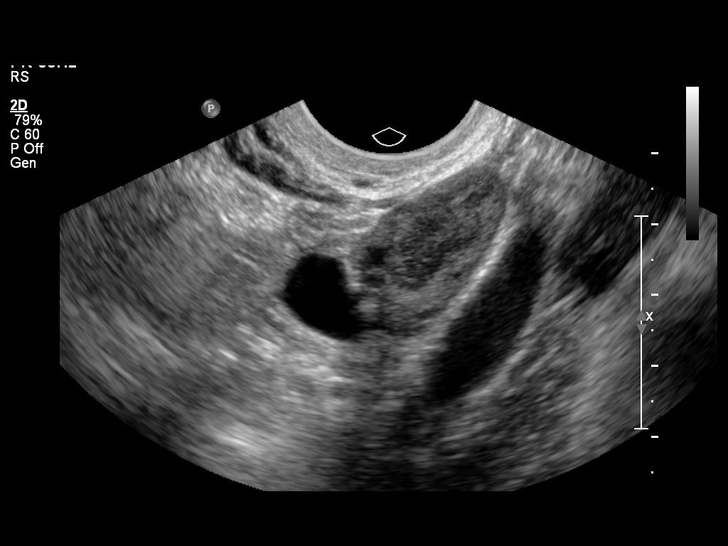
[im 17/41]
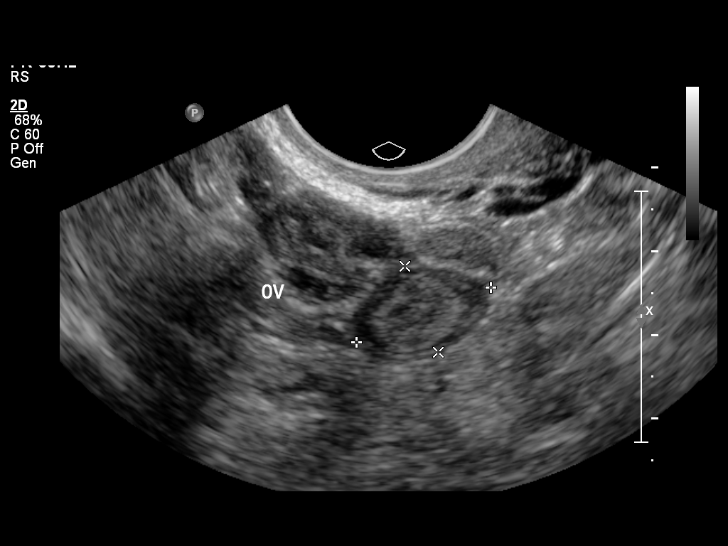
[im 20/41]
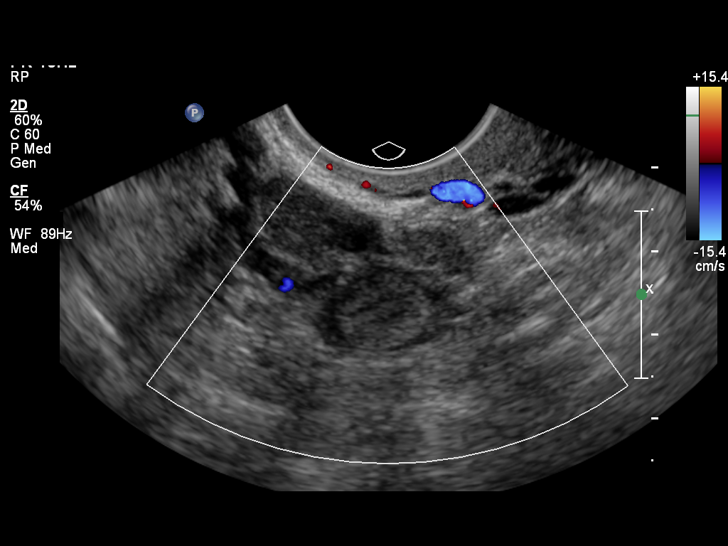
[im 23/41]
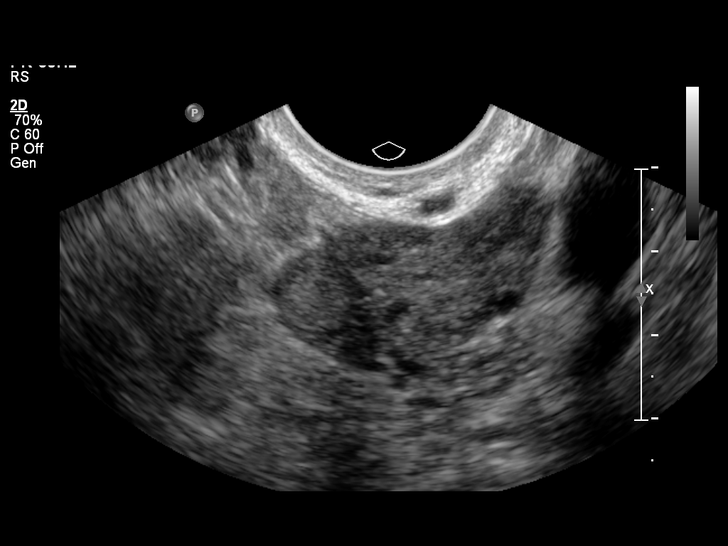
[im 26/41]
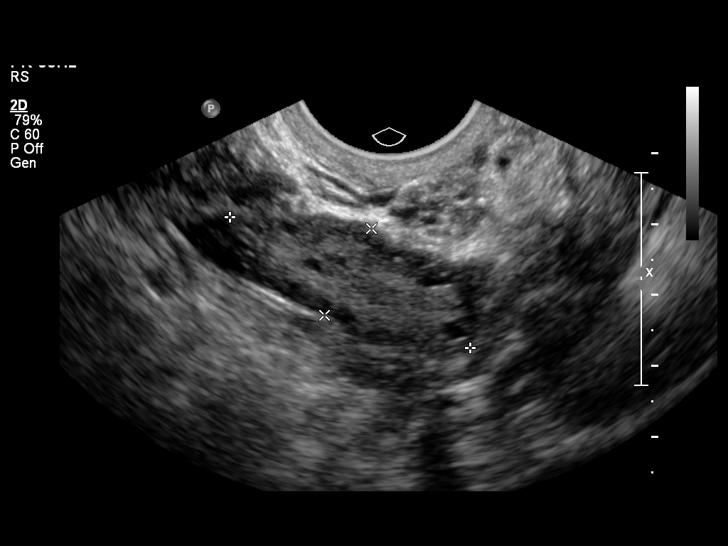
[im 29/41]
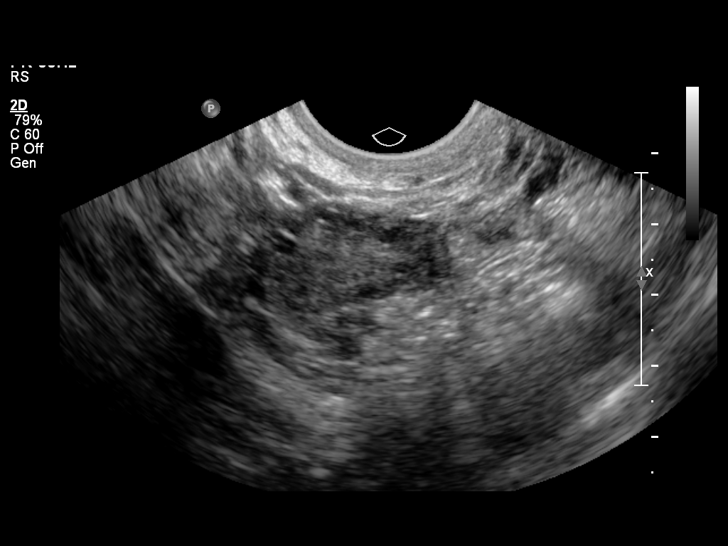
[im 32/41]
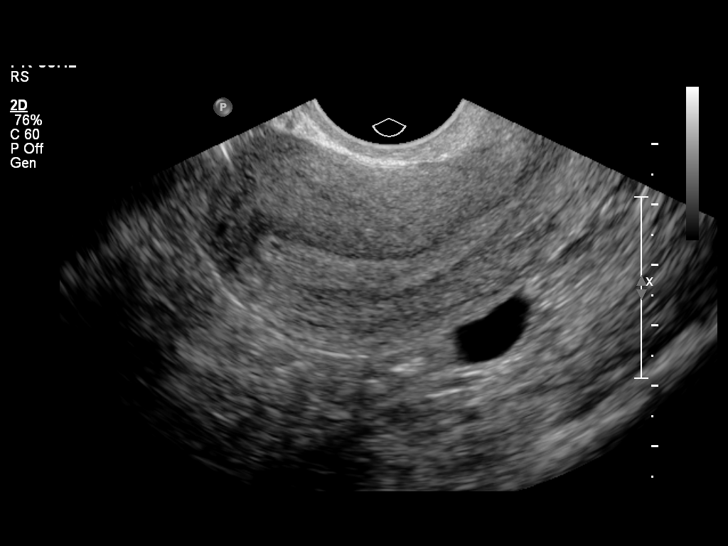
[im 35/41]
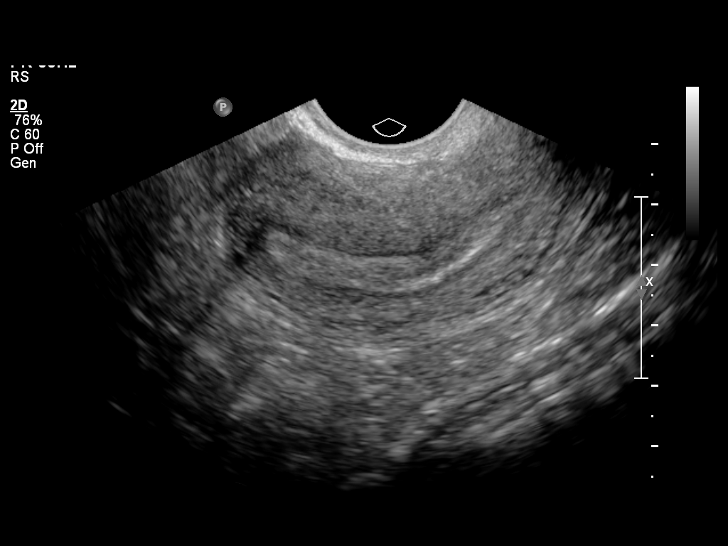
[im 38/41]
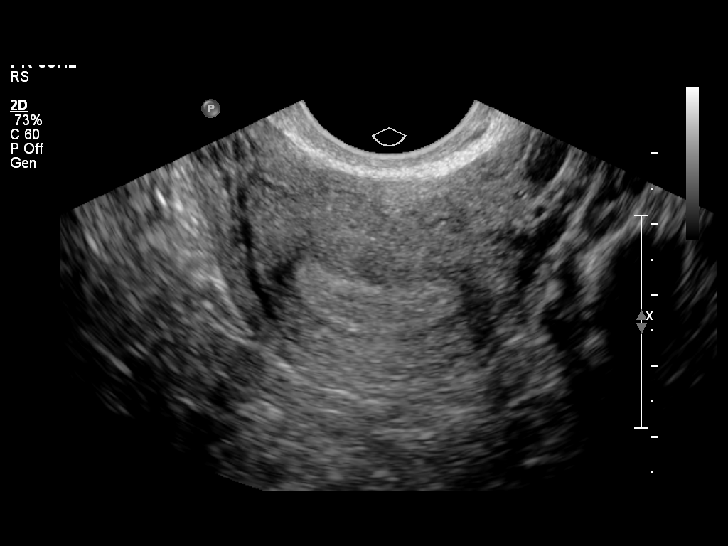
[im 41/41]
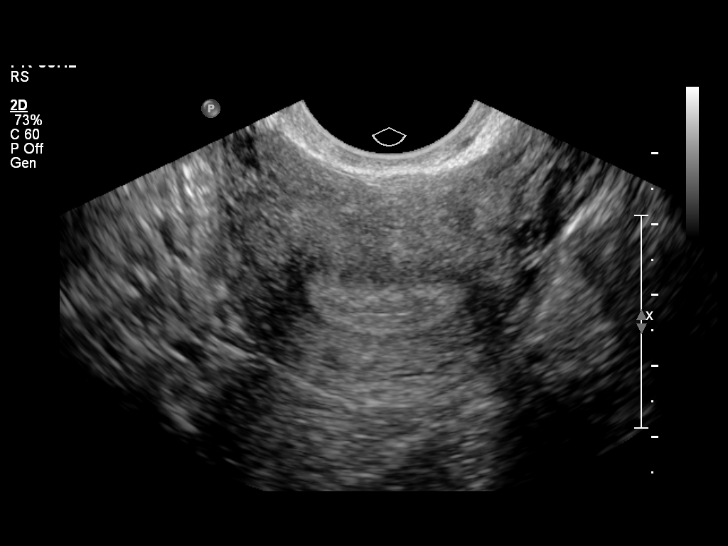

[14 of 28 positions shown; findings below may reference images not displayed]

FINDINGS: There is no intrauterine gestational sac.

Maternal uterus / adnexa:

The right ovary appears normal.

Simple-appearing paraovarian cyst on the left measures 1.4 x
cm.

Adjacent to the left ovary there is a solid mass which measures
x 1.0 x 1.3 cm.  No documented blood flow within this mass is
identified.

There is no free fluid within the pelvis.
IMPRESSION: 1.  There is no evidence for intrauterine pregnancy.
2.  Solid left adnexal mass is indeterminate. In the setting of a
positive pregnancy test I cannot exclude an ectopic pregnancy.,
this will need close clinical follow-up with serial Beta HCG and
pelvic sonography.
3.  No significant free fluid within the pelvis

Critical Value/emergent results were called by telephone at the
time of interpretation on 09/07/2011  at [DATE] p.m.  to  Arthur Rodney
[place], who verbally acknowledged these results.

## 2012-06-17 MED ORDER — HYDROCODONE-ACETAMINOPHEN 5-325 MG PO TABS
ORAL_TABLET | ORAL | Status: AC
  Filled 2012-06-17: qty 1

## 2012-06-17 MED ORDER — PENICILLIN G BENZATHINE 1200000 UNIT/2ML IM SUSP
1.2000 10*6.[IU] | Freq: Once | INTRAMUSCULAR | Status: AC
  Administered 2012-06-17: 1.2 10*6.[IU] via INTRAMUSCULAR

## 2012-06-17 MED ORDER — DEXAMETHASONE SODIUM PHOSPHATE 10 MG/ML IJ SOLN
INTRAMUSCULAR | Status: AC
  Filled 2012-06-17: qty 1

## 2012-06-17 MED ORDER — DEXAMETHASONE SODIUM PHOSPHATE 10 MG/ML IJ SOLN
10.0000 mg | Freq: Once | INTRAMUSCULAR | Status: AC
  Administered 2012-06-17: 10 mg via INTRAMUSCULAR

## 2012-06-17 MED ORDER — HYDROCODONE-ACETAMINOPHEN 5-325 MG PO TABS
1.0000 | ORAL_TABLET | Freq: Once | ORAL | Status: AC
  Administered 2012-06-17: 1 via ORAL

## 2012-06-17 MED ORDER — HYDROCODONE-ACETAMINOPHEN 5-325 MG PO TABS: 1.0000 | ORAL_TABLET | ORAL | Status: DC | PRN

## 2012-06-17 MED ORDER — PENICILLIN G BENZATHINE 1200000 UNIT/2ML IM SUSP
INTRAMUSCULAR | Status: AC
  Filled 2012-06-17: qty 2

## 2012-06-17 NOTE — ED Notes (Signed)
Pt c/o sore throat x3 days.  Sx include: fevers, dysphagia, loss of appetite, white pustules back of throat Denies: nauseas, vomiting, diarrhea  She is alert w/no signs of acute distress.

## 2012-06-17 NOTE — ED Provider Notes (Signed)
History     CSN: [id number]  Arrival date & time 06/17/12  1432   None     Chief Complaint  Patient presents with  . Sore Throat   HPI: Patient is a 22 y.o. female presenting with pharyngitis. The history is provided by the patient. No language interpreter was used.  Sore Throat This is a new problem. The current episode started 2 days ago. The problem occurs constantly. The problem has been gradually worsening. Pertinent negatives include no shortness of breath. The symptoms are aggravated by swallowing, eating and drinking. Nothing relieves the symptoms.  Pt reports fever as high as 101 at home. Throat pain is worse on (L) side and radiates into (L) ear.  H/o strep throat. Denies cough, congestion, sinus congestion or other URI sx's.   Past Medical History  Diagnosis Date  . UTI (urinary tract infection)   . Ectopic pregnancy   . Asthma     "haven't used an inhaler in years"    Past Surgical History  Procedure Date  . Laparoscopy 09/13/2011    Procedure: LAPAROSCOPY OPERATIVE;  Surgeon: Tereso Newcomer, MD;  Location: [facility];  Service: Gynecology;  Laterality: Left;  operative laparoscopy left salpingectomy with removal of ectopic pregnancy.  . Ectopic pregnancy surgery   . Laparoscopic unilateral salpingectomy     LEFT SIDE with ectopic pregnancy    Family History  Problem Relation Age of Onset  . Anesthesia problems Neg Hx   . Hypotension Neg Hx   . Malignant hyperthermia Neg Hx   . Pseudochol deficiency Neg Hx   . Alcohol abuse Neg Hx   . Arthritis Neg Hx   . Birth defects Neg Hx   . Asthma Neg Hx   . Cancer Neg Hx   . COPD Neg Hx   . Depression Neg Hx   . Diabetes Neg Hx   . Drug abuse Neg Hx   . Early death Neg Hx   . Heart disease Neg Hx   . Hearing loss Neg Hx   . Hyperlipidemia Neg Hx   . Hypertension Neg Hx   . Kidney disease Neg Hx   . Learning disabilities Neg Hx   . Mental illness Neg Hx   . Mental retardation Neg Hx   . Miscarriages /  Stillbirths Neg Hx   . Stroke Neg Hx   . Vision loss Neg Hx     History  Substance Use Topics  . Smoking status: Former Smoker -- 1.0 packs/day    Quit date: 04/08/2012  . Smokeless tobacco: Never Used  . Alcohol Use: No    OB History    Grav Para Term Preterm Abortions TAB SAB Ect Mult Living   3 0 0 0 2 0 1 1 0 0       Review of Systems  Constitutional: Positive for fever and chills.  HENT: Positive for ear pain and neck stiffness. Negative for congestion, facial swelling, rhinorrhea, sneezing and ear discharge.   Eyes: Negative.   Respiratory: Negative for cough, shortness of breath and wheezing.   Cardiovascular: Negative.   Gastrointestinal: Negative.   Genitourinary: Negative.   Neurological: Negative.   Hematological: Negative.   Psychiatric/Behavioral: Negative.     Allergies  Contrast media; Other; and Latex  Home Medications   Current Outpatient Rx  Name  Route  Sig  Dispense  Refill  . PRENATAL VITAMINS 28-0.8 MG PO TABS   Oral   Take 1 tablet by mouth daily.   30  tablet   2     BP 103/73  Pulse 97  Temp 99 F (37.2 C) (Oral)  Resp 18  SpO2 100%  LMP 04/08/2012  Breastfeeding? No  Physical Exam  Constitutional: She is oriented to person, place, and time. She appears well-developed and well-nourished.  HENT:  Head: Normocephalic and atraumatic.  Right Ear: Tympanic membrane, external ear and ear canal normal.  Left Ear: Tympanic membrane, external ear and ear canal normal.  Nose: Nose normal. Right sinus exhibits no maxillary sinus tenderness and no frontal sinus tenderness. Left sinus exhibits no maxillary sinus tenderness and no frontal sinus tenderness.  Mouth/Throat: Uvula is midline and mucous membranes are normal. Oropharyngeal exudate, posterior oropharyngeal edema and posterior oropharyngeal erythema present. No tonsillar abscesses.       Tonsils 2-3+ bil w/ thick white exudate.  Eyes: Conjunctivae normal are normal.  Neck: Neck  supple.  Cardiovascular: Normal rate and regular rhythm.   Pulmonary/Chest: Effort normal and breath sounds normal.  Lymphadenopathy:    She has no cervical adenopathy.  Neurological: She is alert and oriented to person, place, and time.  Skin: Skin is dry.  Psychiatric: She has a normal mood and affect.    ED Course  Procedures (including critical care time)   Labs Reviewed  POCT RAPID STREP A ([facility] ONLY)   No results found.   No diagnosis found.    MDM  Grossly swollen, erythematous tonsils bil L>R w/ white exudate. Rapid strep neg. Will treat empirically given PE and h/o strep. Dexamethazone, PCN and short course of medication for pain.         Leanne Chang, NP 06/17/12 [phone]

## 2012-06-17 NOTE — ED Provider Notes (Signed)
Medical screening examination/treatment/procedure(s) were performed by non-physician practitioner and as supervising physician I was immediately available for consultation/collaboration.  Taylin Mans   Deriana Vanderhoef, MD 06/17/12 1733 

## 2012-06-18 MED ORDER — RHO D IMMUNE GLOBULIN 1500 UNIT/2ML IJ SOLN: 300.0000 ug | Freq: Once | INTRAMUSCULAR | Status: AC

## 2012-07-03 ENCOUNTER — Encounter (HOSPITAL_COMMUNITY): Payer: Self-pay | Admitting: Emergency Medicine

## 2012-07-03 ENCOUNTER — Emergency Department (INDEPENDENT_AMBULATORY_CARE_PROVIDER_SITE_OTHER)
Admission: EM | Admit: 2012-07-03 | Discharge: 2012-07-03 | Disposition: A | Discharge status: Home / Self Care | Payer: Medicaid Other | Source: Home / Self Care

## 2012-07-03 DIAGNOSIS — Z23 Encounter for immunization: Secondary | ICD-10-CM

## 2012-07-03 DIAGNOSIS — S0101XA Laceration without foreign body of scalp, initial encounter: Secondary | ICD-10-CM

## 2012-07-03 DIAGNOSIS — S01309A Unspecified open wound of unspecified ear, initial encounter: Secondary | ICD-10-CM

## 2012-07-03 DIAGNOSIS — S01319A Laceration without foreign body of unspecified ear, initial encounter: Secondary | ICD-10-CM

## 2012-07-03 MED ORDER — HYDROCODONE-ACETAMINOPHEN 5-325 MG PO TABS
1.0000 | ORAL_TABLET | Freq: Once | ORAL | Status: AC
  Administered 2012-07-03: 1 via ORAL

## 2012-07-03 MED ORDER — HYDROCODONE-ACETAMINOPHEN 5-325 MG PO TABS
ORAL_TABLET | ORAL | Status: AC
  Filled 2012-07-03: qty 1

## 2012-07-03 MED ORDER — TETANUS-DIPHTH-ACELL PERTUSSIS 5-2.5-18.5 LF-MCG/0.5 IM SUSP
0.5000 mL | Freq: Once | INTRAMUSCULAR | Status: AC
  Administered 2012-07-03: 0.5 mL via INTRAMUSCULAR

## 2012-07-03 MED ORDER — CEPHALEXIN 250 MG PO CAPS: 250.0000 mg | ORAL_CAPSULE | Freq: Three times a day (TID) | ORAL | Status: DC

## 2012-07-03 MED ORDER — TETANUS-DIPHTH-ACELL PERTUSSIS 5-2.5-18.5 LF-MCG/0.5 IM SUSP
INTRAMUSCULAR | Status: AC
  Filled 2012-07-03: qty 0.5

## 2012-07-03 NOTE — ED Provider Notes (Signed)
History     CSN: [id number]  Arrival date & time 07/03/12  1333   None     Chief Complaint  Patient presents with  . Ear Laceration    laceration to left ear and area behind left ear.     (Consider location/radiation/quality/duration/timing/severity/associated sxs/prior treatment) HPI Comments: I agree with chief complaint description as written above. She remains alert and oriented. There has been no loss of consciousness, confusion or disorientation or change in behavior. She has no nausea or vomiting. She denies any neurologic complaints. There are 3 separate lacerations. There is a laceration through the helix of the left pinna, the laceration in the postauricular space and a 0.75 cm laceration at the ear crease at the point it attaches to the scalp.   Past Medical History  Diagnosis Date  . UTI (urinary tract infection)   . Ectopic pregnancy   . Asthma     "haven't used an inhaler in years"    Past Surgical History  Procedure Date  . Laparoscopy 09/13/2011    Procedure: LAPAROSCOPY OPERATIVE;  Surgeon: Tereso Newcomer, MD;  Location: [facility];  Service: Gynecology;  Laterality: Left;  operative laparoscopy left salpingectomy with removal of ectopic pregnancy.  . Ectopic pregnancy surgery   . Laparoscopic unilateral salpingectomy     LEFT SIDE with ectopic pregnancy    Family History  Problem Relation Age of Onset  . Anesthesia problems Neg Hx   . Hypotension Neg Hx   . Malignant hyperthermia Neg Hx   . Pseudochol deficiency Neg Hx   . Alcohol abuse Neg Hx   . Arthritis Neg Hx   . Birth defects Neg Hx   . Asthma Neg Hx   . Cancer Neg Hx   . COPD Neg Hx   . Depression Neg Hx   . Diabetes Neg Hx   . Drug abuse Neg Hx   . Early death Neg Hx   . Heart disease Neg Hx   . Hearing loss Neg Hx   . Hyperlipidemia Neg Hx   . Hypertension Neg Hx   . Kidney disease Neg Hx   . Learning disabilities Neg Hx   . Mental illness Neg Hx   . Mental retardation Neg Hx   .  Miscarriages / Stillbirths Neg Hx   . Stroke Neg Hx   . Vision loss Neg Hx     History  Substance Use Topics  . Smoking status: Former Smoker -- 1.0 packs/day    Quit date: 04/08/2012  . Smokeless tobacco: Never Used  . Alcohol Use: No    OB History    Grav Para Term Preterm Abortions TAB SAB Ect Mult Living   3 0 0 0 2 0 1 1 0 0       Review of Systems  Constitutional: Negative.   HENT: Positive for ear pain. Negative for hearing loss, nosebleeds, facial swelling, dental problem and tinnitus.   Eyes: Negative.   Respiratory: Negative.   Gastrointestinal: Negative.   Skin: Positive for wound.  Neurological: Negative.   Psychiatric/Behavioral: Negative.     Allergies  Contrast media; Other; and Latex  Home Medications   Current Outpatient Rx  Name  Route  Sig  Dispense  Refill  . CEPHALEXIN 250 MG PO CAPS   Oral   Take 1 capsule (250 mg total) by mouth 3 (three) times daily.   12 capsule   0   . HYDROCODONE-ACETAMINOPHEN 5-325 MG PO TABS   Oral   Take  1 tablet by mouth every 4 (four) hours as needed for pain.   10 tablet   0   . PRENATAL VITAMINS 28-0.8 MG PO TABS   Oral   Take 1 tablet by mouth daily.   30 tablet   2     BP 114/78  Pulse 93  Temp 98.6 F (37 C) (Oral)  Resp 18  SpO2 97%  LMP 04/08/2012  Breastfeeding? No  Physical Exam  Nursing note and vitals reviewed. Constitutional: She is oriented to person, place, and time. She appears well-developed and well-nourished. No distress.  HENT:  Head: Normocephalic.  Right Ear: External ear normal.  Ears:  Nose: Nose normal.  Eyes: EOM are normal. Pupils are equal, round, and reactive to light.  Neck: Normal range of motion. Neck supple.  Pulmonary/Chest: Effort normal.  Lymphadenopathy:    She has no cervical adenopathy.  Neurological: She is alert and oriented to person, place, and time. No cranial nerve deficit.  Skin: Skin is warm and dry.  Psychiatric: She has a normal mood and  affect.    ED Course  LACERATION REPAIR Date/Time: 07/03/2012 3:00 PM Performed by: Phineas Real, Bayleigh Loflin Authorized by: Phineas Real, Paiten Boies Consent: Verbal consent obtained. Risks and benefits: risks, benefits and alternatives were discussed Consent given by: patient Patient understanding: patient states understanding of the procedure being performed Patient identity confirmed: verbally with patient Body area: head/neck Location details: left ear Laceration length: 4.8 cm Foreign bodies: no foreign bodies Tendon involvement: none Nerve involvement: none Vascular damage: no Anesthesia: local infiltration Local anesthetic: lidocaine 2% without epinephrine Anesthetic total: 5 ml Patient sedated: no Preparation: Patient was prepped and draped in the usual sterile fashion. Irrigation solution: saline Irrigation method: syringe Amount of cleaning: standard Debridement: none Degree of undermining: none Skin closure: 5-0 nylon Number of sutures: 10 Technique: simple Approximation: close Approximation difficulty: complex Patient tolerance: Patient tolerated the procedure well with no immediate complications. Comments: 3 sutures were placed in the laceration of the helix. Once together the remainder of the laceration was glued. The postauricular laceration 2 and half centimeters was closed with 5 sutures. A second postauricular laceration 0.75 cm at the junction of the penis and scalp was closed with 2 sutures.   (including critical care time)  Labs Reviewed - No data to display No results found.   1. Laceration of pinna   2. Laceration of postauricular region       MDM  3 lacerations repaired as above T. dap administer today Keflex 250 mg 3 times a day for 4 days Sutures out in 6 days Watch for signs of infection, for any problems the symptoms are worsening return. Injury instruction; laceration care instructions       Hayden Rasmussen, NP 07/03/12 1931

## 2012-07-03 NOTE — ED Notes (Signed)
Evaluated pt. Pt states that she fell at her friends house by missing a step and hit the left side of her head on the railing. Pt has a laceration to her left and laceration to the area behind ear. Bleeding is controlled. Pt did not loose consciousness. Pt is stable and speaking in clear sentences, no acute distress. Mw,cma

## 2012-07-03 NOTE — ED Notes (Signed)
Pt states that she was at a friends house going down the steps and missed one causing her to fall and hitting the left side of head. Laceration to left ear and area behind ear. Pt did not lose consciousness. Bleeding has stopped. Pt is sitting up right and alert,no distress.

## 2012-07-03 NOTE — ED Notes (Signed)
Pt has informed me that she did have a black out moment where everything went dark which only lasted a couple seconds. Mw,cma.

## 2012-07-03 NOTE — ED Notes (Signed)
Incident happened around 1:45 p.m today.

## 2012-07-04 NOTE — ED Provider Notes (Signed)
Medical screening examination/treatment/procedure(s) were performed by resident physician or non-physician practitioner and as supervising physician I was immediately available for consultation/collaboration.   Alaster Asfaw DOUGLAS MD.    Muzamil Harker D Yarianna Varble, MD 07/04/12 1630 

## 2012-07-11 ENCOUNTER — Emergency Department (INDEPENDENT_AMBULATORY_CARE_PROVIDER_SITE_OTHER)
Admission: EM | Admit: 2012-07-11 | Discharge: 2012-07-11 | Disposition: A | Discharge status: Home / Self Care | Payer: Self-pay | Source: Home / Self Care | Attending: Emergency Medicine | Admitting: Emergency Medicine

## 2012-07-11 ENCOUNTER — Encounter (HOSPITAL_COMMUNITY): Payer: Self-pay | Admitting: Emergency Medicine

## 2012-07-11 DIAGNOSIS — Z4802 Encounter for removal of sutures: Secondary | ICD-10-CM

## 2012-07-11 NOTE — ED Provider Notes (Signed)
Medical screening examination/treatment/procedure(s) were performed by non-physician practitioner and as supervising physician I was immediately available for consultation/collaboration.  Raynald Blend, MD 07/11/12 1539

## 2012-07-11 NOTE — ED Provider Notes (Signed)
History     CSN: [id number]  Arrival date & time 07/11/12  1227   First MD Initiated Contact with Patient 07/11/12 1434      Chief Complaint  Patient presents with  . Suture / Staple Removal    suture removal only. suture of left ear lobe and behind ear.     (Consider location/radiation/quality/duration/timing/severity/associated sxs/prior treatment) Patient is a 22 y.o. female presenting with suture removal. The history is provided by the patient. No language interpreter was used.  Suture / Staple Removal  The sutures were placed 7 to 10 days ago. There has been no treatment since the wound repair. There is no redness present. There is no swelling present. The pain has no pain.    Past Medical History  Diagnosis Date  . UTI (urinary tract infection)   . Ectopic pregnancy   . Asthma     "haven't used an inhaler in years"    Past Surgical History  Procedure Date  . Laparoscopy 09/13/2011    Procedure: LAPAROSCOPY OPERATIVE;  Surgeon: Tereso Newcomer, MD;  Location: [facility];  Service: Gynecology;  Laterality: Left;  operative laparoscopy left salpingectomy with removal of ectopic pregnancy.  . Ectopic pregnancy surgery   . Laparoscopic unilateral salpingectomy     LEFT SIDE with ectopic pregnancy    Family History  Problem Relation Age of Onset  . Anesthesia problems Neg Hx   . Hypotension Neg Hx   . Malignant hyperthermia Neg Hx   . Pseudochol deficiency Neg Hx   . Alcohol abuse Neg Hx   . Arthritis Neg Hx   . Birth defects Neg Hx   . Asthma Neg Hx   . Cancer Neg Hx   . COPD Neg Hx   . Depression Neg Hx   . Diabetes Neg Hx   . Drug abuse Neg Hx   . Early death Neg Hx   . Heart disease Neg Hx   . Hearing loss Neg Hx   . Hyperlipidemia Neg Hx   . Hypertension Neg Hx   . Kidney disease Neg Hx   . Learning disabilities Neg Hx   . Mental illness Neg Hx   . Mental retardation Neg Hx   . Miscarriages / Stillbirths Neg Hx   . Stroke Neg Hx   . Vision loss Neg Hx      History  Substance Use Topics  . Smoking status: Former Smoker -- 1.0 packs/day    Quit date: 04/08/2012  . Smokeless tobacco: Never Used  . Alcohol Use: No    OB History    Grav Para Term Preterm Abortions TAB SAB Ect Mult Living   3 0 0 0 2 0 1 1 0 0       Review of Systems  All other systems reviewed and are negative.    Allergies  Contrast media; Other; and Latex  Home Medications   Current Outpatient Rx  Name  Route  Sig  Dispense  Refill  . CEPHALEXIN 250 MG PO CAPS   Oral   Take 1 capsule (250 mg total) by mouth 3 (three) times daily.   12 capsule   0   . HYDROCODONE-ACETAMINOPHEN 5-325 MG PO TABS   Oral   Take 1 tablet by mouth every 4 (four) hours as needed for pain.   10 tablet   0   . PRENATAL VITAMINS 28-0.8 MG PO TABS   Oral   Take 1 tablet by mouth daily.   30 tablet  2     BP 109/73  Pulse 64  Temp 98.1 F (36.7 C) (Oral)  Resp 18  SpO2 100%  LMP 07/09/2012  Breastfeeding? No  Physical Exam  Nursing note and vitals reviewed. Constitutional: She appears well-developed and well-nourished.  HENT:  Head: Normocephalic and atraumatic.       Healed laceration left ear lobe and behind earlobe  Neurological: She is alert.  Skin: Skin is warm.  Psychiatric: She has a normal mood and affect.    ED Course  Procedures (including critical care time)  Labs Reviewed - No data to display No results found.   1. Visit for suture removal       MDM  Return if any problems.        Lonia Skinner [place], [place] 07/11/12 1534

## 2012-07-11 NOTE — ED Notes (Signed)
Pt here for suture removal only. Removal of sutures from left ear lobe and area behind ear. Both sights are well healed no signs of infection. Pt voices no concerns.

## 2012-07-24 ENCOUNTER — Other Ambulatory Visit: Payer: Self-pay

## 2013-01-30 ENCOUNTER — Emergency Department (INDEPENDENT_AMBULATORY_CARE_PROVIDER_SITE_OTHER)
Admission: EM | Admit: 2013-01-30 | Discharge: 2013-01-30 | Disposition: A | Discharge status: Home / Self Care | Payer: Medicaid Other | Source: Home / Self Care

## 2013-01-30 ENCOUNTER — Encounter (HOSPITAL_COMMUNITY): Payer: Self-pay | Admitting: *Deleted

## 2013-01-30 ENCOUNTER — Other Ambulatory Visit (HOSPITAL_COMMUNITY)
Admission: RE | Admit: 2013-01-30 | Discharge: 2013-01-30 | Disposition: A | Discharge status: Home / Self Care | Payer: Medicaid Other | Source: Ambulatory Visit | Attending: Emergency Medicine | Admitting: Emergency Medicine

## 2013-01-30 DIAGNOSIS — Z3201 Encounter for pregnancy test, result positive: Secondary | ICD-10-CM

## 2013-01-30 DIAGNOSIS — N760 Acute vaginitis: Secondary | ICD-10-CM | POA: Insufficient documentation

## 2013-01-30 DIAGNOSIS — Z113 Encounter for screening for infections with a predominantly sexual mode of transmission: Secondary | ICD-10-CM | POA: Insufficient documentation

## 2013-01-30 DIAGNOSIS — R110 Nausea: Secondary | ICD-10-CM

## 2013-01-30 LAB — POCT URINALYSIS DIP (DEVICE)
Glucose, UA: NEGATIVE mg/dL
Leukocytes, UA: NEGATIVE
Nitrite: NEGATIVE
Urobilinogen, UA: 0.2 mg/dL (ref 0.0–1.0)
pH: 6 (ref 5.0–8.0)

## 2013-01-30 LAB — RPR: RPR Ser Ql: NONREACTIVE

## 2013-01-30 LAB — HCG, QUANTITATIVE, PREGNANCY: hCG, Beta Chain, Quant, S: 76 m[IU]/mL — ABNORMAL HIGH (ref <5)

## 2013-01-30 MED ORDER — ONDANSETRON 4 MG PO TBDP: 4.0000 mg | ORAL_TABLET | Freq: Four times a day (QID) | ORAL | Status: DC | PRN

## 2013-01-30 MED ORDER — PRENATAL VITAMINS 28-0.8 MG PO TABS: 1.0000 | ORAL_TABLET | Freq: Every day | ORAL | Status: DC

## 2013-01-30 NOTE — ED Provider Notes (Signed)
Medical screening examination/treatment/procedure(s) were performed by non-physician practitioner and as supervising physician I was immediately available for consultation/collaboration.  Leslee Home, M.D.  Reuben Likes, MD 01/30/13 [phone]

## 2013-01-30 NOTE — ED Provider Notes (Addendum)
CSN: [id number]     Arrival date & time 01/30/13  1255 History     First MD Initiated Contact with Patient 01/30/13 1523     Chief Complaint  Patient presents with  . Nausea  . Exposure to STD   (Consider location/radiation/quality/duration/timing/severity/associated sxs/prior Treatment) HPI Comments: 22 year old female presents for evaluation of nausea for the past week. She would also like testing for STDs. She has noticed an increase in clear vaginal discharge. She denies any fever, chills, abdominal pain, vomiting. She has had unprotected sexual intercourse recently. Her last period was in the beginning of August and was normal as far she remembers. She notes that she has been pregnant before in the past and had to have surgery for an ectopic pregnancy. At that time, she had severe abdominal pain, but she denies any such symptoms at this time  Patient is a 22 y.o. female presenting with STD exposure.  Exposure to STD Pertinent negatives include no chest pain, no abdominal pain and no shortness of breath.    Past Medical History  Diagnosis Date  . UTI (urinary tract infection)   . Ectopic pregnancy   . Asthma     "haven't used an inhaler in years"   Past Surgical History  Procedure Laterality Date  . Laparoscopy  09/13/2011    Procedure: LAPAROSCOPY OPERATIVE;  Surgeon: Tereso Newcomer, MD;  Location: [facility];  Service: Gynecology;  Laterality: Left;  operative laparoscopy left salpingectomy with removal of ectopic pregnancy.  . Ectopic pregnancy surgery    . Laparoscopic unilateral salpingectomy      LEFT SIDE with ectopic pregnancy   Family History  Problem Relation Age of Onset  . Anesthesia problems Neg Hx   . Hypotension Neg Hx   . Malignant hyperthermia Neg Hx   . Pseudochol deficiency Neg Hx   . Alcohol abuse Neg Hx   . Arthritis Neg Hx   . Birth defects Neg Hx   . Asthma Neg Hx   . Cancer Neg Hx   . COPD Neg Hx   . Depression Neg Hx   . Diabetes Neg Hx   .  Drug abuse Neg Hx   . Early death Neg Hx   . Heart disease Neg Hx   . Hearing loss Neg Hx   . Hyperlipidemia Neg Hx   . Hypertension Neg Hx   . Kidney disease Neg Hx   . Learning disabilities Neg Hx   . Mental illness Neg Hx   . Mental retardation Neg Hx   . Miscarriages / Stillbirths Neg Hx   . Stroke Neg Hx   . Vision loss Neg Hx    History  Substance Use Topics  . Smoking status: Former Smoker -- 1.00 packs/day    Quit date: 04/08/2012  . Smokeless tobacco: Never Used  . Alcohol Use: No   OB History as of 02/01/13   Grav Para Term Preterm Abortions TAB SAB Ect Mult Living   4 0 0 0 2 0 1 1 0 0      Review of Systems  Constitutional: Negative for fever and chills.  Eyes: Negative for visual disturbance.  Respiratory: Negative for cough and shortness of breath.   Cardiovascular: Negative for chest pain, palpitations and leg swelling.  Gastrointestinal: Positive for nausea. Negative for vomiting and abdominal pain.  Endocrine: Negative for polydipsia and polyuria.  Genitourinary: Positive for vaginal discharge. Negative for dysuria, urgency, frequency, vaginal bleeding, vaginal pain and pelvic pain.  Musculoskeletal: Negative for myalgias  and arthralgias.  Skin: Negative for rash.  Neurological: Negative for dizziness, weakness and light-headedness.    Allergies  Contrast media; Other; and Latex  Home Medications   Current Outpatient Rx  Name  Route  Sig  Dispense  Refill  . cephALEXin (KEFLEX) 250 MG capsule   Oral   Take 1 capsule (250 mg total) by mouth 3 (three) times daily.   12 capsule   0   . ondansetron (ZOFRAN-ODT) 4 MG disintegrating tablet   Oral   Take 1 tablet (4 mg total) by mouth every 6 (six) hours as needed for nausea. PRN for nausea or vomiting   30 tablet   0   . Prenatal Vit-Fe Fumarate-FA (PRENATAL VITAMINS) 28-0.8 MG TABS   Oral   Take 1 tablet by mouth daily.   30 tablet   2    BP 104/55  Pulse 68  Temp(Src) 98.7 F (37.1 C)  (Oral)  Resp 16  SpO2 99%  LMP 01/08/2013 Physical Exam  Nursing note and vitals reviewed. Constitutional: She is oriented to person, place, and time. Vital signs are normal. She appears well-developed and well-nourished. No distress.  HENT:  Head: Normocephalic and atraumatic.  Eyes: EOM are normal. Pupils are equal, round, and reactive to light.  Cardiovascular: Normal rate, regular rhythm and normal heart sounds.  Exam reveals no gallop and no friction rub.   No murmur heard. Pulmonary/Chest: Effort normal and breath sounds normal. No respiratory distress. She has no wheezes. She has no rales.  Abdominal: Soft. She exhibits no mass. There is no tenderness. There is no rebound and no guarding.  Neurological: She is alert and oriented to person, place, and time. She has normal strength.  Skin: Skin is warm and dry. She is not diaphoretic.  Psychiatric: She has a normal mood and affect. Her behavior is normal. Judgment normal.    ED Course   Procedures (including critical care time)  Labs Reviewed  HCG, QUANTITATIVE, PREGNANCY - Abnormal; Notable for the following:    hCG, Beta Chain, Quant, S 76 (*)    All other components within normal limits  POCT URINALYSIS DIP (DEVICE) - Abnormal; Notable for the following:    Hgb urine dipstick TRACE (*)    Protein, ur 30 (*)    All other components within normal limits  POCT PREGNANCY, URINE - Abnormal; Notable for the following:    Preg Test, Ur POSITIVE (*)    All other components within normal limits  RPR  HIV ANTIBODY (ROUTINE TESTING)  URINE CYTOLOGY ANCILLARY ONLY  CERVICOVAGINAL ANCILLARY ONLY   No results found. 1. Positive urine pregnancy test   2. Nausea     MDM  Positive pregnancy test. Patient is afebrile and nontoxic with no abdominal pain. We are checking for STDs as well as checking a hCG quantitative. [facility], advised followup within 2 days.   Meds ordered this encounter   Medications  . ondansetron (ZOFRAN-ODT) 4 MG disintegrating tablet    Sig: Take 1 tablet (4 mg total) by mouth every 6 (six) hours as needed for nausea. PRN for nausea or vomiting    Dispense:  30 tablet    Refill:  0  . Prenatal Vit-Fe Fumarate-FA (PRENATAL VITAMINS) 28-0.8 MG TABS    Sig: Take 1 tablet by mouth daily.    Dispense:  30 tablet    Refill:  2    Order Specific Question:  Supervising Provider    Answer:  CONSTANT, PEGGY [[id number]]     Graylon Good, PA-C 01/30/13 1539  Graylon Good, PA-C 01/30/13 1540   Labs came back positive for chlamydia.  Sending in single 1 gram dose of azithromycin to pharmacy.  She [facility]   Graylon Good, PA-C 02/02/13 1836   Came back positive for BV as well.  Starting 1 week of flagyl BID  Graylon Good, PA-C 02/03/13 1412

## 2013-01-30 NOTE — ED Notes (Addendum)
Patient states she has been feeling nauseous for past week; also thinks she has been exposed to STD; states discharge and urinary urgency.

## 2013-01-31 ENCOUNTER — Telehealth (HOSPITAL_COMMUNITY): Payer: Self-pay | Admitting: *Deleted

## 2013-02-01 ENCOUNTER — Inpatient Hospital Stay (HOSPITAL_COMMUNITY)
Admission: AD | Admit: 2013-02-01 | Discharge: 2013-02-01 | Disposition: A | Discharge status: Home / Self Care | Payer: Medicaid Other | Source: Ambulatory Visit | Attending: Obstetrics and Gynecology | Admitting: Obstetrics and Gynecology

## 2013-02-01 ENCOUNTER — Encounter (HOSPITAL_COMMUNITY): Payer: Self-pay

## 2013-02-01 DIAGNOSIS — O99891 Other specified diseases and conditions complicating pregnancy: Secondary | ICD-10-CM | POA: Insufficient documentation

## 2013-02-01 DIAGNOSIS — Z3201 Encounter for pregnancy test, result positive: Secondary | ICD-10-CM

## 2013-02-01 LAB — HCG, QUANTITATIVE, PREGNANCY: hCG, Beta Chain, Quant, S: 198 m[IU]/mL — ABNORMAL HIGH (ref <5)

## 2013-02-01 NOTE — MAU Note (Signed)
Patient to MAU for follow up BHCG. Was seen at Urgent Care on 8-24. Patient denies any pain, bleeding, discharge or nausea.

## 2013-02-01 NOTE — MAU Provider Note (Signed)
Ms. Cheyenne Walton is a 22 y.o. G4P0020 at [redacted]w[redacted]d who presents to MAU today for follow-up quant hCG. The patient was seen at urgent care for STD testing and had +UPT at that time. Quant hCG was 76. Patient was told to follow-up here in 48 hours for labs. Patient denies pain, bleeding, fever or N/V today.   BP 119/75  Pulse 81  Temp(Src) 98.1 F (36.7 C) (Oral)  Resp 16  Ht 5\' 5"  (1.651 m)  Wt 174 lb 12.8 oz (79.289 kg)  BMI 29.09 kg/m2  SpO2 100%  LMP 01/08/2013 GENERAL: Well-developed, well-nourished female in no acute distress.  HEENT: Normocephalic, atraumatic.   LUNGS: Effort normal HEART: Regular rate  SKIN: Warm, dry and without erythema PSYCH: Normal mood and affect  Results for orders placed during the hospital encounter of 02/01/13 (from the past 24 hour(s))  HCG, QUANTITATIVE, PREGNANCY     Status: Abnormal   Collection Time    02/01/13  4:44 PM      Result Value Range   hCG, Beta Chain, Quant, S 198 (*) <5 mIU/mL    A: Appropriate rise in quant hCG  P: Discharge home Follow-up with [place] in 1 week. They will call patient with an appointment First trimester warning signs reviewed Patient may return to MAU as needed  Freddi Starr, PA-C 02/01/2013 6:10 PM

## 2013-02-01 NOTE — ED Notes (Signed)
GC neg., Chlamydia pos., Trich neg.,  HIV/RPR non-reactive, Quan. HCG 76 H, Candida and Gardnerella pending.  Message sent to Almedia Balls PA and Dr. Lorenz Coaster requesting tx. order. Vassie Moselle 02/01/2013

## 2013-02-02 MED ORDER — AZITHROMYCIN 500 MG PO TABS: 1000.0000 mg | ORAL_TABLET | Freq: Once | ORAL | Status: DC

## 2013-02-03 ENCOUNTER — Telehealth (HOSPITAL_COMMUNITY): Payer: Self-pay | Admitting: *Deleted

## 2013-02-03 MED ORDER — METRONIDAZOLE 500 MG PO TABS: 500.0000 mg | ORAL_TABLET | Freq: Two times a day (BID) | ORAL | Status: DC

## 2013-02-03 NOTE — ED Notes (Signed)
Gardnerella pos. and Candida neg.  Almedia Balls PA e-prescribed Zithromax for Chlamydia. I showed him the new results and he said he would e-prescribed Flagyl to the [facility] on [address].  I called pt. Pt. verified x 2 and given results. Pt. told she what she needs and where to pick it up.   Pt. instructed to no alcohol while taking the Flagyl.  Pt. instructed to notify her partner, no sex for 1 week and to practice safe sex. Pt. told she should get HIV rechecked in 6 mos. at the [facility]. STD clinic, by appointment or with her OB-GYN. DHHS form completed and faxed to the [facility]. Vassie Moselle 02/03/2013

## 2013-02-04 ENCOUNTER — Telehealth (HOSPITAL_COMMUNITY): Payer: Self-pay | Admitting: *Deleted

## 2013-02-04 NOTE — ED Notes (Signed)
Pt. called and said she can't afford the Zithromax. States its $28.00. She asked if she could come back here to be treated. I called back and told her, there was no one here now for me to ask.  I told her, I would ask on Mon., but she could also try the [facility] STD clinic on Monday. I asked her if her Mom could help her and she said no, she has not income either. Vassie Moselle 02/04/2013

## 2013-02-06 ENCOUNTER — Telehealth (HOSPITAL_COMMUNITY): Payer: Self-pay | Admitting: Emergency Medicine

## 2013-02-06 MED ORDER — AZITHROMYCIN 500 MG PO TABS: 1000.0000 mg | ORAL_TABLET | Freq: Once | ORAL | Status: DC

## 2013-02-06 NOTE — ED Provider Notes (Signed)
Medical screening examination/treatment/procedure(s) were performed by non-physician practitioner and as supervising physician I was immediately available for consultation/collaboration.  Yandell Mcjunkins, M.D.  Aiken Withem C Lashya Passe, MD 02/06/13 1452 

## 2013-02-06 NOTE — ED Notes (Signed)
The patient's Chlamydia DNA probe came back positive. She will need treatment for this. I will send in a prescription for azithromycin to her pharmacy. She is pregnant, but they should be safe to take during pregnancy.  Reuben Likes, MD 02/06/13 [phone]

## 2013-02-06 NOTE — Telephone Encounter (Signed)
Message copied by Reuben Likes on Sun Feb 06, 2013  2:45 PM ------      Message from: Vassie Moselle      Created: Tue Feb 01, 2013  4:09 PM      Regarding: labs       Chlamydia pos., GC and Trich neg., HIV/RPR non-reactive, Gardnerella and Candida pending.  Pt. needs treated and is pregnant.      Vassie Moselle      02/01/2013       ------

## 2013-02-08 ENCOUNTER — Telehealth (HOSPITAL_COMMUNITY): Payer: Self-pay | Admitting: *Deleted

## 2013-02-08 ENCOUNTER — Encounter (HOSPITAL_COMMUNITY): Payer: Self-pay | Admitting: Family

## 2013-02-08 ENCOUNTER — Inpatient Hospital Stay (HOSPITAL_COMMUNITY)
Admission: AD | Admit: 2013-02-08 | Discharge: 2013-02-08 | Disposition: A | Discharge status: Home / Self Care | Payer: Medicaid Other | Source: Ambulatory Visit | Attending: Obstetrics & Gynecology | Admitting: Obstetrics & Gynecology

## 2013-02-08 ENCOUNTER — Inpatient Hospital Stay (HOSPITAL_COMMUNITY): Payer: Medicaid Other

## 2013-02-08 DIAGNOSIS — Z3490 Encounter for supervision of normal pregnancy, unspecified, unspecified trimester: Secondary | ICD-10-CM

## 2013-02-08 DIAGNOSIS — O99891 Other specified diseases and conditions complicating pregnancy: Secondary | ICD-10-CM | POA: Insufficient documentation

## 2013-02-08 DIAGNOSIS — Z1389 Encounter for screening for other disorder: Secondary | ICD-10-CM

## 2013-02-08 MED ORDER — METRONIDAZOLE 500 MG PO TABS: 500.0000 mg | ORAL_TABLET | Freq: Two times a day (BID) | ORAL | Status: DC

## 2013-02-08 NOTE — MAU Provider Note (Signed)
History     CSN: [id number]  Arrival date and time: 02/08/13 1636   None     No chief complaint on file.  HPI  Pt is a G4P0020 at [redacted]w[redacted]d weeks here at 106w3d who presents to MAU today for follow-up quant hCG. The patient was seen at urgent care for STD testing and had +UPT at that time. Quant hCG was 76. Patient returned to MAU on 02/01/13 and BHCG was found to be 198.  Here today for repeat BHCG and ultrasound.  No report of vaginal bleeding or pelvic pain.     Past Medical History  Diagnosis Date  . UTI (urinary tract infection)   . Ectopic pregnancy   . Asthma     "haven't used an inhaler in years"    Past Surgical History  Procedure Laterality Date  . Laparoscopy  09/13/2011    Procedure: LAPAROSCOPY OPERATIVE;  Surgeon: Tereso Newcomer, MD;  Location: [facility];  Service: Gynecology;  Laterality: Left;  operative laparoscopy left salpingectomy with removal of ectopic pregnancy.  . Ectopic pregnancy surgery    . Laparoscopic unilateral salpingectomy      LEFT SIDE with ectopic pregnancy    Family History  Problem Relation Age of Onset  . Anesthesia problems Neg Hx   . Hypotension Neg Hx   . Malignant hyperthermia Neg Hx   . Pseudochol deficiency Neg Hx   . Alcohol abuse Neg Hx   . Arthritis Neg Hx   . Birth defects Neg Hx   . Asthma Neg Hx   . Cancer Neg Hx   . COPD Neg Hx   . Depression Neg Hx   . Diabetes Neg Hx   . Drug abuse Neg Hx   . Early death Neg Hx   . Heart disease Neg Hx   . Hearing loss Neg Hx   . Hyperlipidemia Neg Hx   . Hypertension Neg Hx   . Kidney disease Neg Hx   . Learning disabilities Neg Hx   . Mental illness Neg Hx   . Mental retardation Neg Hx   . Miscarriages / Stillbirths Neg Hx   . Stroke Neg Hx   . Vision loss Neg Hx     History  Substance Use Topics  . Smoking status: Former Smoker -- 1.00 packs/day    Quit date: 04/08/2012  . Smokeless tobacco: Never Used  . Alcohol Use: No    Allergies:  Allergies  Allergen Reactions   . Contrast Media [Iodinated Diagnostic Agents] Anaphylaxis  . Other Anaphylaxis, Rash and Other (See Comments)    Seafood - throat swelling  . Latex Hives    Prescriptions prior to admission  Medication Sig Dispense Refill  . azithromycin (ZITHROMAX) 500 MG tablet Take 2 tablets (1,000 mg total) by mouth once.  2 tablet  0  . azithromycin (ZITHROMAX) 500 MG tablet Take 2 tablets (1,000 mg total) by mouth once.  2 tablet  0  . cephALEXin (KEFLEX) 250 MG capsule Take 1 capsule (250 mg total) by mouth 3 (three) times daily.  12 capsule  0  . metroNIDAZOLE (FLAGYL) 500 MG tablet Take 1 tablet (500 mg total) by mouth 2 (two) times daily.  14 tablet  0  . ondansetron (ZOFRAN-ODT) 4 MG disintegrating tablet Take 1 tablet (4 mg total) by mouth every 6 (six) hours as needed for nausea. PRN for nausea or vomiting  30 tablet  0  . Prenatal Vit-Fe Fumarate-FA (PRENATAL VITAMINS) 28-0.8 MG TABS Take 1 tablet  by mouth daily.  30 tablet  2    ROS Pertinent info in HPI  Physical Exam   Last menstrual period 01/08/2013.  Physical Exam  Constitutional: She is oriented to person, place, and time. She appears well-developed and well-nourished. No distress.  HENT:  Head: Normocephalic.  Neck: Normal range of motion. Neck supple.  Neurological: She is alert and oriented to person, place, and time. She has normal reflexes.  Skin: Skin is warm and dry.    MAU Course  Procedures  Results for orders placed during the hospital encounter of 02/08/13 (from the past 24 hour(s))  HCG, QUANTITATIVE, PREGNANCY     Status: Abnormal   Collection Time    02/08/13  5:15 PM      Result Value Range   hCG, Beta Chain, Quant, S 4644 (*) <5 mIU/mL    Ultrasound: Intrauterine gestational sac: Visualized  Yolk sac: Visualized  Embryo: Not visualized at this time  MSD: 6.8 mm 5 w 2 d  Maternal uterus/adnexae:  No subchorionic hemorrhage is identified. The right ovary has a  normal appearance and contains a  corpus luteum cyst. The left  ovary is normal. No free pelvic fluid is identified.  IMPRESSION:  Early intrauterine pregnancy. At this time, an intrauterine  gestational sac and yolk sac are visualized. No embryo is  visualized, but not unexpected given the mean sac diameter. Follow-  up ultrasound in 2 to 3 weeks could be considered to confirm a  viable intrauterine pregnancy, if desired. Additionally,  ultrasound for fetal anatomic evaluation at 18-[redacted] weeks gestational  age is recommended.   Assessment and Plan  Intrauterine Pregnancy Hx of Miscarriages  Plan: Discharge to home Follow-up ultrasound for viability in 10 days Schedule appt in [facility] > message routed Bleeding precautions   [facility] 02/08/2013, 6:12 PM

## 2013-02-08 NOTE — MAU Provider Note (Signed)
Attestation of Attending Supervision of Advanced Practitioner (CNM/NP): Evaluation and management procedures were performed by the Advanced Practitioner under my supervision and collaboration.  I have reviewed the Advanced Practitioner's note and chart, and I agree with the management and plan.  HARRAWAY-SMITH, Tu Bayle 8:23 PM     

## 2013-02-08 NOTE — ED Notes (Addendum)
Discussed Rx. of Zithromax being $28.00 with Almedia Balls PA. He said if pt. goes to [email or web address], she can print a coupon and get the Rx. at [facility] for $10.75. I called pt. back and gave her this information in a voicemail. Vassie Moselle 02/08/2013 Mom called back and said she heard the message on the VM and said she and her daughter do not have any income.  I told her, that I had also directed Dorothey to f/u at the [facility], if she could not pay for the medicine. She said she would see if another family member could pay for it.   I called Brandi Sessoms at the [facility] and told her pt. is still not treated, even though I sent the form as treated on 8/28.  I told her pt. stated she could not afford to fill the Rx. I told her I had directed to f/u at the [facility].  She said she would follow it up. Vassie Moselle 02/08/2013

## 2013-02-15 ENCOUNTER — Encounter: Payer: Self-pay | Admitting: Advanced Practice Midwife

## 2013-02-15 NOTE — MAU Provider Note (Signed)
Attestation of Attending Supervision of Advanced Practitioner (CNM/NP): Evaluation and management procedures were performed by the Advanced Practitioner under my supervision and collaboration. I have reviewed the Advanced Practitioner's note and chart, and I agree with the management and plan.  Jamauri Kruzel H. 11:44 AM   

## 2013-02-18 ENCOUNTER — Inpatient Hospital Stay (HOSPITAL_COMMUNITY)
Admission: AD | Admit: 2013-02-18 | Discharge: 2013-02-18 | Disposition: A | Discharge status: Home / Self Care | Payer: Medicaid Other | Source: Ambulatory Visit | Attending: Obstetrics & Gynecology | Admitting: Obstetrics & Gynecology

## 2013-02-18 ENCOUNTER — Inpatient Hospital Stay (HOSPITAL_COMMUNITY): Payer: Medicaid Other

## 2013-02-18 DIAGNOSIS — R110 Nausea: Secondary | ICD-10-CM

## 2013-02-18 DIAGNOSIS — O99891 Other specified diseases and conditions complicating pregnancy: Secondary | ICD-10-CM | POA: Insufficient documentation

## 2013-02-18 NOTE — MAU Note (Signed)
Pt here for F/U U/S today, denies any pain or bleeding.

## 2013-02-18 NOTE — MAU Provider Note (Signed)
  HPI: Ms. Cheyenne Walton is a 22 y.o. female G4P0030 at [redacted]w[redacted]d who presents for a follow up US for viability. She has an appointment scheduled in the clinic on Mar 10, 2013. She denies pain or bleeding today, no concerns.  She has occasional Nausea, but no vomiting. She is taking her prenatal vitamin daily.   Objective:   GENERAL: Well-developed, well-nourished female in no acute distress.  HEENT: Normocephalic, atraumatic.   LUNGS: Effort normal HEART: Regular rate  SKIN: Warm, dry and without erythema PSYCH: Normal mood and affect  Filed Vitals:   02/18/13 1645  BP: 115/58  Pulse: 68  Temp: 98.4 F (36.9 C)  TempSrc: Oral  Resp: 18  Height: 5\' 5"  (1.651 m)  Weight: 80.74 kg (178 lb)    US Ob Transvaginal  02/18/2013   CLINICAL DATA:  Follow-up viability.  EXAM: TRANSVAGINAL OB ULTRASOUND  TECHNIQUE: Transvaginal ultrasound was performed for complete evaluation of the gestation as well as the maternal uterus, adnexal regions, and pelvic cul-de-sac.  COMPARISON:  02-2013  FINDINGS: Intrauterine gestational sac: Visualized/normal in shape.  Yolk sac:  Present  Embryo:  Present  Cardiac Activity: Present  Heart Rate: 116 bpm  MSD:   mm    w     d  CRL:   4.5  mm   6 w 2 d                  [place] EDC: 10/12/2013  Maternal uterus/adnexae: No subchorionic hemorrhage  Normal right ovary. Small corpus luteum cyst.  Normal left ovary.  No free pelvic fluid.  IMPRESSION: Single living intrauterine embryo estimated at 6 weeks and 2 days gestation.  No subcortical hemorrhage.  Normal ovaries.   Electronically Signed   By: Loralie Champagne M.D.   On: 02/18/2013 17:47   A: IUP with cardiac activity   P:  Discharge home Keep your appointment in the clinic Return to MAU if needed Take your prenatal vitamin daily Support given  Iona Hansen Rasch, NP 02/18/2013 6:03 PM

## 2013-03-10 ENCOUNTER — Ambulatory Visit (INDEPENDENT_AMBULATORY_CARE_PROVIDER_SITE_OTHER): Payer: Medicaid Other | Admitting: Advanced Practice Midwife

## 2013-03-10 ENCOUNTER — Encounter: Payer: Self-pay | Admitting: Advanced Practice Midwife

## 2013-03-10 VITALS — BP 106/69 | Temp 98.3°F | Wt 187.2 lb

## 2013-03-10 DIAGNOSIS — O0990 Supervision of high risk pregnancy, unspecified, unspecified trimester: Secondary | ICD-10-CM

## 2013-03-10 DIAGNOSIS — K5900 Constipation, unspecified: Secondary | ICD-10-CM

## 2013-03-10 DIAGNOSIS — O2620 Pregnancy care for patient with recurrent pregnancy loss, unspecified trimester: Secondary | ICD-10-CM

## 2013-03-10 DIAGNOSIS — Z23 Encounter for immunization: Secondary | ICD-10-CM

## 2013-03-10 LAB — POCT URINALYSIS DIP (DEVICE)
Bilirubin Urine: NEGATIVE
Ketones, ur: NEGATIVE mg/dL
Leukocytes, UA: NEGATIVE

## 2013-03-10 MED ORDER — DOCUSATE SODIUM 100 MG PO CAPS: 100.0000 mg | ORAL_CAPSULE | Freq: Two times a day (BID) | ORAL | Status: DC | PRN

## 2013-03-10 MED ORDER — FOLIC ACID 1 MG PO TABS: 1.0000 mg | ORAL_TABLET | Freq: Every day | ORAL | Status: DC

## 2013-03-10 MED ORDER — INFLUENZA VIRUS VACC SPLIT PF IM SUSP: 0.5000 mL | Freq: Once | INTRAMUSCULAR | Status: DC

## 2013-03-10 MED ORDER — PRENATAL VITAMINS 28-0.8 MG PO TABS: 1.0000 | ORAL_TABLET | Freq: Every day | ORAL | Status: DC

## 2013-03-10 NOTE — Progress Notes (Signed)
Informal [place] for FHR = 175 bpm per PW doppler.   Sharen Counter CNM notified.

## 2013-03-10 NOTE — Progress Notes (Signed)
Pulse: 77

## 2013-03-10 NOTE — Progress Notes (Signed)
Subjective:    Cheyenne Walton is a [id number] [redacted]w[redacted]d being seen today for her first obstetrical visit.  Her obstetrical history is significant for 3 previous SABs . Patient does intend to breast feed. Pregnancy history fully reviewed.  Patient reports no complaints.  Filed Vitals:   03/10/13 1444  BP: 106/69  Temp: 98.3 F (36.8 C)  Weight: 187 lb 3.2 oz (84.913 kg)    HISTORY: OB History  Gravida Para Term Preterm AB SAB TAB Ectopic Multiple Living  4 0 0 0 3 2 0 1 0 0     # Outcome Date GA Lbr Len/2nd Weight Sex Delivery Anes PTL Lv  4 CUR           3 SAB 12/29/11          2 ECT 09/13/11             Comments: MTX with no result > left salpingectomy  1 SAB 2013             Comments: System Generated. Please review and update pregnancy details.     Past Medical History  Diagnosis Date  . UTI (urinary tract infection)   . Ectopic pregnancy   . Asthma     "haven't used an inhaler in years"   Past Surgical History  Procedure Laterality Date  . Laparoscopy  09/13/2011    Procedure: LAPAROSCOPY OPERATIVE;  Surgeon: Tereso Newcomer, MD;  Location: [facility];  Service: Gynecology;  Laterality: Left;  operative laparoscopy left salpingectomy with removal of ectopic pregnancy.  . Ectopic pregnancy surgery    . Laparoscopic unilateral salpingectomy      LEFT SIDE with ectopic pregnancy   Family History  Problem Relation Age of Onset  . Anesthesia problems Neg Hx   . Hypotension Neg Hx   . Malignant hyperthermia Neg Hx   . Pseudochol deficiency Neg Hx   . Cancer Maternal Grandmother      Exam    Uterus:   ~ 9 week size, FHT on U/S in office today  Pelvic Exam:    Perineum: No Hemorrhoids, Normal Perineum   Vulva: normal   Vagina:  normal mucosa, normal discharge   pH:    Cervix: no cervical motion tenderness, no lesions and nulliparous appearance   Adnexa: normal adnexa and no mass, fullness, tenderness   Bony Pelvis: average  System: Breast:  normal appearance, no  masses or tenderness   Skin: normal coloration and turgor, no rashes    Neurologic: oriented, normal   Extremities: normal strength, tone, and muscle mass, ROM of all joints is normal   HEENT neck supple with midline trachea and thyroid without masses   Mouth/Teeth mucous membranes moist, pharynx normal without lesions   Neck supple and no masses   Cardiovascular: regular rate and rhythm   Respiratory:  appears well, vitals normal, no respiratory distress, acyanotic, normal RR, ear and throat exam is normal, neck free of mass or lymphadenopathy, chest clear, no wheezing, crepitations, rhonchi, normal symmetric air entry   Abdomen: soft, non-tender; bowel sounds normal; no masses,  no organomegaly   Urinary: urethral meatus normal      Assessment:    Pregnancy: G4P0030 Patient Active Problem List   Diagnosis Date Noted  . Supervision of high-risk pregnancy 03/10/2013        Plan:     Initial labs drawn. Prenatal vitamins. Problem list reviewed and updated. Genetic Screening discussed First Screen: requested.  Ultrasound discussed; fetal survey: requested.  Follow up in 3 weeks. 50% of 30 min visit spent on counseling and coordination of care.  FHT visible on U/S in office today    LEFTWICH-KIRBY, Marquitta Persichetti 03/10/2013

## 2013-03-11 LAB — CULTURE, OB URINE
Colony Count: NO GROWTH
Organism ID, Bacteria: NO GROWTH

## 2013-03-11 LAB — OBSTETRIC PANEL
Basophils Absolute: 0 10*3/uL (ref 0.0–0.1)
Basophils Relative: 0 % (ref 0–1)
Eosinophils Absolute: 0.2 10*3/uL (ref 0.0–0.7)
Hepatitis B Surface Ag: NEGATIVE
MCH: 31 pg (ref 26.0–34.0)
MCHC: 34.5 g/dL (ref 30.0–36.0)
Neutrophils Relative %: 68 % (ref 43–77)
Platelets: 246 10*3/uL (ref 150–400)
RBC: 3.64 MIL/uL — ABNORMAL LOW (ref 3.87–5.11)

## 2013-03-14 DIAGNOSIS — O2620 Pregnancy care for patient with recurrent pregnancy loss, unspecified trimester: Secondary | ICD-10-CM | POA: Insufficient documentation

## 2013-03-15 ENCOUNTER — Telehealth: Payer: Self-pay | Admitting: *Deleted

## 2013-03-15 NOTE — Telephone Encounter (Addendum)
Message copied by Jill Side on Tue Mar 15, 2013 11:03 AM ------      Message from: Sharen Counter A      Created: Fri Mar 11, 2013  9:26 AM       I ordered MFM consult for pt to have NT and First screen at MFM in 3 weeks, the week of her next prenatal visit.  Please call her to schedule.  Thank you. ------  I called pt and left message to call us back regarding an appt which has been scheduled for her.  Pt needs to be informed of MFM consult and First Screen on 10/31 @ 1000. The appt will take approx. 2 hrs.  Pt also needs to be asked if she has applied for Medicaid- if not, she should do so immediately.

## 2013-03-17 NOTE — Telephone Encounter (Signed)
Honey left a message stating  A nurse called and she was calling back. Called Alexzandra's number and left appointment information with her mother.

## 2013-03-31 ENCOUNTER — Ambulatory Visit (INDEPENDENT_AMBULATORY_CARE_PROVIDER_SITE_OTHER): Payer: Medicaid Other | Admitting: Obstetrics & Gynecology

## 2013-03-31 VITALS — BP 113/76 | Temp 97.1°F | Wt 185.2 lb

## 2013-03-31 DIAGNOSIS — O360990 Maternal care for other rhesus isoimmunization, unspecified trimester, not applicable or unspecified: Secondary | ICD-10-CM

## 2013-03-31 DIAGNOSIS — O360111 Maternal care for anti-D [Rh] antibodies, first trimester, fetus 1: Secondary | ICD-10-CM

## 2013-03-31 DIAGNOSIS — O0991 Supervision of high risk pregnancy, unspecified, first trimester: Secondary | ICD-10-CM

## 2013-03-31 DIAGNOSIS — Z6791 Unspecified blood type, Rh negative: Secondary | ICD-10-CM | POA: Insufficient documentation

## 2013-03-31 LAB — POCT URINALYSIS DIP (DEVICE)
Glucose, UA: NEGATIVE mg/dL
Ketones, ur: NEGATIVE mg/dL
Specific Gravity, Urine: >=1.03 (ref 1.005–1.030)
Urobilinogen, UA: 0.2 mg/dL (ref 0.0–1.0)

## 2013-03-31 NOTE — Progress Notes (Signed)
First screen scheduled.  No other complaints or concerns.  Routine obstetric precautions reviewed.

## 2013-03-31 NOTE — Progress Notes (Signed)
P=71 Pt. C/o of a little lower abdominal/pelvic pressure but states no pain.

## 2013-03-31 NOTE — Patient Instructions (Signed)
Return to clinic for any obstetric concerns or go to MAU for evaluation  

## 2013-04-05 ENCOUNTER — Other Ambulatory Visit: Payer: Self-pay | Admitting: Obstetrics & Gynecology

## 2013-04-05 DIAGNOSIS — Z3682 Encounter for antenatal screening for nuchal translucency: Secondary | ICD-10-CM

## 2013-04-06 ENCOUNTER — Encounter: Payer: Self-pay | Admitting: *Deleted

## 2013-04-06 ENCOUNTER — Other Ambulatory Visit (HOSPITAL_COMMUNITY): Payer: Self-pay

## 2013-04-06 LAB — US OB LIMITED

## 2013-04-08 ENCOUNTER — Encounter (HOSPITAL_COMMUNITY): Payer: Self-pay

## 2013-04-08 ENCOUNTER — Ambulatory Visit (HOSPITAL_COMMUNITY)
Admission: RE | Admit: 2013-04-08 | Discharge: 2013-04-08 | Disposition: A | Discharge status: Home / Self Care | Payer: Medicaid Other | Source: Ambulatory Visit | Attending: Obstetrics and Gynecology | Admitting: Obstetrics and Gynecology

## 2013-04-08 ENCOUNTER — Ambulatory Visit (HOSPITAL_COMMUNITY): Admission: RE | Admit: 2013-04-08 | Payer: Medicaid Other | Source: Ambulatory Visit

## 2013-04-08 ENCOUNTER — Encounter: Payer: Self-pay | Admitting: Obstetrics & Gynecology

## 2013-04-08 DIAGNOSIS — Z3682 Encounter for antenatal screening for nuchal translucency: Secondary | ICD-10-CM

## 2013-04-08 DIAGNOSIS — O3510X0 Maternal care for (suspected) chromosomal abnormality in fetus, unspecified, not applicable or unspecified: Secondary | ICD-10-CM | POA: Insufficient documentation

## 2013-04-08 DIAGNOSIS — O351XX0 Maternal care for (suspected) chromosomal abnormality in fetus, not applicable or unspecified: Secondary | ICD-10-CM | POA: Insufficient documentation

## 2013-04-08 DIAGNOSIS — Z3689 Encounter for other specified antenatal screening: Secondary | ICD-10-CM | POA: Insufficient documentation

## 2013-04-08 DIAGNOSIS — O2620 Pregnancy care for patient with recurrent pregnancy loss, unspecified trimester: Secondary | ICD-10-CM | POA: Insufficient documentation

## 2013-04-08 NOTE — Consult Note (Signed)
MFM Consultation  After taking the patient's history and reviewing available information and medical records, I discussed the subject of recurrent miscarriage in detail. I explained that there are several definitions of recurrent miscarriage, and that there is some argument as to whether two or three early pregnancy losses are required to make the diagnosis.   The patient and I went over the known and suspected etiologies of recurrent miscarriage, including genetic, autoimmune, anatomic, hormonal, infectious, and thrombophilic.  In the genetic category, I explained that 2-4 percent of couples with recurrent miscarriage are found to carry a balanced translocation. I explained the nature of a balanced translocation, emphasizing that people who carry balanced translocations are completely normal, but because of their chromosomal rearrangements, they are at increased risk for miscarriage of genetically abnormal offspring.  In regard to autoimmune conditions resulting in miscarriage, the most well described is antiphospholipid syndrome. We discussed the nature of autoimmune conditions in general and antiphospholipid syndrome per se. I described the diagnostic criteria for antiphospholipid syndrome, including antibody markers and clinical criteria. Finally, I explained that the literature indicates that some 5-15 percent of women with recurrent miscarriage are found to have antiphospholipid antibodies.  We spoke about anatomic (structural) malformations of the uterus as a cause for pregnancy loss. I described a septate uterus and told the patient that the diagnosis of uterine malformation usually involves hysterosalpingogram, hysteroscopy, or sonohystogram. We also briefly discussed uterine synecheae as a possible cause of pregnancy loss. I indicated that structural uterine abnormalities are found in about 10 percent of recurrent pregnancy loss. I told her that some malformations, such as a uterine septum,  are correctable, generally with hysteroscopic surgery.  I went over the hormonal etiologies of pregnancy loss, concentrating the discussion on progesterone. I explained the concept of progesterone defect and inadequate luteal phase, acknowledging that there is some controversy over whether such a "disease" even exists. I told the patient that endometrial biopsy for diagnosis of luteal phase defect is considered by many experts to be the "gold standard" diagnostic test. I explained that some patients may be given empiric progesterone treatment during the first trimester of pregnancy, without a firm diagnosis. I explained that this common practice is difficult to justify based on scientific evidence, and is probably Unnecessary and not of any clinical benefit in most cases.  I was brief in my discussion of possible infectious causes of recurrent miscarriage, since there is very little credible evidence that such a cause exists. I told her that genital tract infections are more widely recognized as problems in second and third-trimester, and that genital cultures and empiric antibiotic therapy are generally not part of the management of recurrent miscarriage.  I talked to the patient about possible thrombophilic causes for recurrent pregnancy loss, explaining that there is considerable controversy about whether or not they contribute to recurrent early miscarriage (pre-embryonic and embryonic losses). I mentioned, however, that thrombophilias were better accepted as a "cause" of fetal death. I also explained our developing understanding of the role of thrombophilic mutations in recurrent pregnancy loss (and third-trimester complications), including the presumed underlying pathophysiology.  Finally, I spent considerable time discussing the issues surrounding her individualized pregnancy history of 1. 09/2011 ectopic, 2. 12/2011 anembryonic SAb at around 4 weeks and 3. Anembryonic SAb at around 4 weeks.  I  explained to her that a short interpregnancy interval and her heavy smoking of 1 pack per day increases risk for ectopic pregnancy and early SAb.  Given that she has a singleton  fetus with gestational age appropriate morphology at 73 wk 5d and discontinued smoking, she has modified her most significant risk factors with a better (albeit still suboptimal) interpregnancy interval and discontinuation of smoking.   My plan is to FOREGO a standard work-up in this case, but I would recommend a formal workup should this pregnancy result in a pregnancy loss given this is her first pregnancy with demonstrable viable fetus (ie, consult MFM again for evaluation should this happen, noting <15% chance at this point).    Impressions: 1. Hx ectopic pregnancy x 1 2. Hx of 2 anembryonic pregnancy losses both in context of heavy smoking and extremely short interpregnancy intervals 3. Viable SIUP at [redacted]w[redacted]d 4. Hx heavy smoking, now discontinued since August 2014  Plan of Care: 1. Recommend msQUAD 15-20 weeks 2. Anatomic survey at 18 weeks 3. Recommend continued smoking cessation 4. Would initiate interval growth q6-8 weeks by ultrasound beginning at 24 weeks should patient resume smoking during pregnancy  Time Spent: I spent in excess of 45 minutes in consultation with this patient to review records, evaluate her case, and provide her with an adequate discussion and education.  More than 50% of this time was spent in direct face-to-face counseling. It was a pleasure seeing your patient in the office today.  Thank you for consultation. Please do not hesitate to contact our service for any further questions.  Thank you,  Cheyenne Walton, Cheyenne Sjogren, MD, MS, FACOG Assistant Professor Section of Maternal-Fetal Medicine [facility]

## 2013-04-28 ENCOUNTER — Ambulatory Visit (INDEPENDENT_AMBULATORY_CARE_PROVIDER_SITE_OTHER): Payer: Medicaid Other | Admitting: Obstetrics and Gynecology

## 2013-04-28 ENCOUNTER — Encounter: Payer: Self-pay | Admitting: Obstetrics and Gynecology

## 2013-04-28 VITALS — BP 102/68 | Temp 98.0°F | Wt 187.7 lb

## 2013-04-28 DIAGNOSIS — O2620 Pregnancy care for patient with recurrent pregnancy loss, unspecified trimester: Secondary | ICD-10-CM

## 2013-04-28 DIAGNOSIS — O360990 Maternal care for other rhesus isoimmunization, unspecified trimester, not applicable or unspecified: Secondary | ICD-10-CM

## 2013-04-28 DIAGNOSIS — O2622 Pregnancy care for patient with recurrent pregnancy loss, second trimester: Secondary | ICD-10-CM

## 2013-04-28 DIAGNOSIS — O0992 Supervision of high risk pregnancy, unspecified, second trimester: Secondary | ICD-10-CM

## 2013-04-28 DIAGNOSIS — O360121 Maternal care for anti-D [Rh] antibodies, second trimester, fetus 1: Secondary | ICD-10-CM

## 2013-04-28 LAB — POCT URINALYSIS DIP (DEVICE)
Ketones, ur: NEGATIVE mg/dL
Protein, ur: 30 mg/dL — AB
pH: 6 (ref 5.0–8.0)

## 2013-04-28 NOTE — Progress Notes (Signed)
P=72,  C/o pelvic pressure and uncomfortable feeling like diarrhea sometimes.

## 2013-04-28 NOTE — Progress Notes (Signed)
Patient doing well without complaints. Anatomy ultrasound scheduled 12/5. Patient wanting to change [facility] since MFM mentioned new EDC of 10/09/2012. Patient with 6 w ultrasound demonstrating EDC of 10/12/2012 and patient sure of LMP. EDC not changed. Quad screen next visit

## 2013-05-06 ENCOUNTER — Encounter (HOSPITAL_COMMUNITY): Payer: Self-pay | Admitting: *Deleted

## 2013-05-06 ENCOUNTER — Inpatient Hospital Stay (HOSPITAL_COMMUNITY)
Admission: AD | Admit: 2013-05-06 | Discharge: 2013-05-06 | Disposition: A | Discharge status: Home / Self Care | Payer: Medicaid Other | Source: Ambulatory Visit | Attending: Obstetrics and Gynecology | Admitting: Obstetrics and Gynecology

## 2013-05-06 DIAGNOSIS — R42 Dizziness and giddiness: Secondary | ICD-10-CM | POA: Insufficient documentation

## 2013-05-06 DIAGNOSIS — R109 Unspecified abdominal pain: Secondary | ICD-10-CM | POA: Insufficient documentation

## 2013-05-06 DIAGNOSIS — O99891 Other specified diseases and conditions complicating pregnancy: Secondary | ICD-10-CM | POA: Insufficient documentation

## 2013-05-06 DIAGNOSIS — K219 Gastro-esophageal reflux disease without esophagitis: Secondary | ICD-10-CM | POA: Insufficient documentation

## 2013-05-06 DIAGNOSIS — R51 Headache: Secondary | ICD-10-CM | POA: Insufficient documentation

## 2013-05-06 DIAGNOSIS — O26892 Other specified pregnancy related conditions, second trimester: Secondary | ICD-10-CM

## 2013-05-06 DIAGNOSIS — N949 Unspecified condition associated with female genital organs and menstrual cycle: Secondary | ICD-10-CM | POA: Insufficient documentation

## 2013-05-06 DIAGNOSIS — O26899 Other specified pregnancy related conditions, unspecified trimester: Secondary | ICD-10-CM

## 2013-05-06 DIAGNOSIS — O9989 Other specified diseases and conditions complicating pregnancy, childbirth and the puerperium: Secondary | ICD-10-CM

## 2013-05-06 LAB — COMPREHENSIVE METABOLIC PANEL
Alkaline Phosphatase: 55 U/L (ref 39–117)
BUN: 6 mg/dL (ref 6–23)
CO2: 25 mEq/L (ref 19–32)
Chloride: 100 mEq/L (ref 96–112)
Creatinine, Ser: 0.59 mg/dL (ref 0.50–1.10)
GFR calc non Af Amer: >90 mL/min (ref >90)
Glucose, Bld: 72 mg/dL (ref 70–99)
Potassium: 3.6 mEq/L (ref 3.5–5.1)
Total Bilirubin: 0.1 mg/dL — ABNORMAL LOW (ref 0.3–1.2)

## 2013-05-06 LAB — CBC
HCT: 33.5 % — ABNORMAL LOW (ref 36.0–46.0)
Hemoglobin: 11.5 g/dL — ABNORMAL LOW (ref 12.0–15.0)
MCV: 90.5 fL (ref 78.0–100.0)
RBC: 3.7 MIL/uL — ABNORMAL LOW (ref 3.87–5.11)
WBC: 11 10*3/uL — ABNORMAL HIGH (ref 4.0–10.5)

## 2013-05-06 LAB — URINALYSIS, ROUTINE W REFLEX MICROSCOPIC
Glucose, UA: NEGATIVE mg/dL
Hgb urine dipstick: NEGATIVE
Leukocytes, UA: NEGATIVE
Protein, ur: NEGATIVE mg/dL
pH: 6 (ref 5.0–8.0)

## 2013-05-06 LAB — WET PREP, GENITAL
Trich, Wet Prep: NONE SEEN
Yeast Wet Prep HPF POC: NONE SEEN

## 2013-05-06 MED ORDER — DIPHENHYDRAMINE HCL 50 MG/ML IJ SOLN: 25.0000 mg | Freq: Once | INTRAMUSCULAR | Status: DC

## 2013-05-06 MED ORDER — BUTALBITAL-APAP-CAFFEINE 50-325-40 MG PO TABS: 1.0000 | ORAL_TABLET | Freq: Four times a day (QID) | ORAL | Status: DC | PRN

## 2013-05-06 MED ORDER — LACTATED RINGERS IV BOLUS (SEPSIS): 1000.0000 mL | Freq: Once | INTRAVENOUS | Status: DC

## 2013-05-06 MED ORDER — DEXAMETHASONE SODIUM PHOSPHATE 10 MG/ML IJ SOLN: 10.0000 mg | Freq: Once | INTRAMUSCULAR | Status: DC

## 2013-05-06 MED ORDER — BUTALBITAL-APAP-CAFFEINE 50-325-40 MG PO TABS
2.0000 | ORAL_TABLET | Freq: Once | ORAL | Status: AC
  Administered 2013-05-06: 2 via ORAL
  Filled 2013-05-06: qty 2

## 2013-05-06 MED ORDER — METOCLOPRAMIDE HCL 5 MG/ML IJ SOLN: 10.0000 mg | Freq: Once | INTRAMUSCULAR | Status: DC

## 2013-05-06 MED ORDER — OMEPRAZOLE 20 MG PO CPDR: 20.0000 mg | DELAYED_RELEASE_CAPSULE | Freq: Every day | ORAL | Status: DC

## 2013-05-06 MED ORDER — GI COCKTAIL ~~LOC~~
30.0000 mL | Freq: Once | ORAL | Status: AC
  Administered 2013-05-06: 30 mL via ORAL
  Filled 2013-05-06: qty 30

## 2013-05-06 NOTE — MAU Note (Signed)
I'm having headaches to the point of feeling dizzy. Pelvic pain. Bad heartburn. Tylenol helps h/a alittle but as soon as it wears off headaches are back

## 2013-05-06 NOTE — MAU Provider Note (Signed)
History     CSN: [id number]  Arrival date and time: 05/06/13 1942   None     Chief Complaint  Patient presents with  . Headache  . Dizziness  . Pelvic Pain   HPI Pt is [redacted]w[redacted]d pregnant and c/o of lower abdominal pain that comes and goes.  Pt also c/o headache every day.  Pt has taken Tylenol  With relief until headache wears off.  Pt denies spots or blurred vision but is sensitive to lights and sounds.  Pt states she did not have  Headaches before she was pregnant.  Pt c/o lower abdominal pain comes and goes and sometimes is sharp and brings her to her knees. She is not having the pain at this time.  Pt has had morning sickness but has been able to tolerate food and PO fluids.  Pt is not taking anything for her nausea now,. Pt states she has some vaginal dishcarge- cream colored.  Pt denis UTI sx or constipation or diarrhea.  Pt also has heartburn- she has taken Zantac Today but heartburn persist.  . RN Note: Angelina Sheriff, RN Registered Nurse Signed  MAU Note Service date: 05/06/2013 7:56 PM   I'm having headaches to the point of feeling dizzy. Pelvic pain. Bad heartburn. Tylenol helps h/a alittle but as soon as it wears off headaches are back    Past Medical History  Diagnosis Date  . UTI (urinary tract infection)   . Ectopic pregnancy   . Asthma     "haven't used an inhaler in years"    Past Surgical History  Procedure Laterality Date  . Laparoscopy  09/13/2011    Procedure: LAPAROSCOPY OPERATIVE;  Surgeon: Tereso Newcomer, MD;  Location: [facility];  Service: Gynecology;  Laterality: Left;  operative laparoscopy left salpingectomy with removal of ectopic pregnancy.  . Ectopic pregnancy surgery    . Laparoscopic unilateral salpingectomy      LEFT SIDE with ectopic pregnancy    Family History  Problem Relation Age of Onset  . Anesthesia problems Neg Hx   . Hypotension Neg Hx   . Malignant hyperthermia Neg Hx   . Pseudochol deficiency Neg Hx   . Cancer Maternal Grandmother      History  Substance Use Topics  . Smoking status: Former Smoker -- 1.00 packs/day    Quit date: 04/08/2012  . Smokeless tobacco: Never Used  . Alcohol Use: No    Allergies:  Allergies  Allergen Reactions  . Contrast Media [Iodinated Diagnostic Agents] Anaphylaxis  . Other Anaphylaxis, Rash and Other (See Comments)    Seafood - throat swelling  . Latex Hives    Facility-administered medications prior to admission  Medication Dose Route Frequency Provider Last Rate Last Dose  . influenza  inactive virus vaccine (FLUZONE/FLUARIX) injection 0.5 mL  0.5 mL Intramuscular Once Hurshel Party, CNM       Prescriptions prior to admission  Medication Sig Dispense Refill  . folic acid (FOLVITE) 1 MG tablet Take 1 tablet (1 mg total) by mouth daily.  30 tablet  5  . ondansetron (ZOFRAN-ODT) 4 MG disintegrating tablet Take 1 tablet (4 mg total) by mouth every 6 (six) hours as needed for nausea. PRN for nausea or vomiting  30 tablet  0  . Prenatal Vit-Fe Fumarate-FA (PRENATAL VITAMINS) 28-0.8 MG TABS Take 1 tablet by mouth daily.  30 tablet  2    Review of Systems  Constitutional: Negative for fever and chills.  Eyes: Negative for blurred  vision.  Gastrointestinal: Positive for nausea and abdominal pain. Negative for diarrhea and constipation.       Morning sickness  Genitourinary: Negative for dysuria and urgency.  Neurological: Positive for dizziness and headaches.   Physical Exam   Blood pressure 112/63, pulse 84, resp. rate 20, last menstrual period 01/08/2013.  Physical Exam  Nursing note and vitals reviewed. Constitutional: She is oriented to person, place, and time. She appears well-developed and well-nourished. No distress.  HENT:  Head: Normocephalic.  Eyes: Pupils are equal, round, and reactive to light.  Neck: Normal range of motion. Neck supple.  Cardiovascular: Normal rate.   Respiratory: Effort normal.  GI: Soft. She exhibits no distension. There is no  tenderness. There is no rebound.  FHT 156 bpm with doppler  Genitourinary:  Cervix long and closed, NT uterus gravid, NT small amount of frothy cream colored discharge  Musculoskeletal: Normal range of motion.  Neurological: She is alert and oriented to person, place, and time.  Skin: Skin is warm and dry.  Psychiatric: She has a normal mood and affect.    MAU Course  Procedures Results for orders placed during the hospital encounter of 05/06/13 (from the past 24 hour(s))  URINALYSIS, ROUTINE W REFLEX MICROSCOPIC     Status: Abnormal   Collection Time    05/06/13  7:59 PM      Result Value Range   Color, Urine YELLOW  YELLOW   APPearance CLEAR  CLEAR   Specific Gravity, Urine >1.030 (*) 1.005 - 1.030   pH 6.0  5.0 - 8.0   Glucose, UA NEGATIVE  NEGATIVE mg/dL   Hgb urine dipstick NEGATIVE  NEGATIVE   Bilirubin Urine NEGATIVE  NEGATIVE   Ketones, ur NEGATIVE  NEGATIVE mg/dL   Protein, ur NEGATIVE  NEGATIVE mg/dL   Urobilinogen, UA 0.2  0.0 - 1.0 mg/dL   Nitrite NEGATIVE  NEGATIVE   Leukocytes, UA NEGATIVE  NEGATIVE  CBC     Status: Abnormal   Collection Time    05/06/13  8:16 PM      Result Value Range   WBC 11.0 (*) 4.0 - 10.5 K/uL   RBC 3.70 (*) 3.87 - 5.11 MIL/uL   Hemoglobin 11.5 (*) 12.0 - 15.0 g/dL   HCT 16.1 (*) 09.6 - 04.5 %   MCV 90.5  78.0 - 100.0 fL   MCH 31.1  26.0 - 34.0 pg   MCHC 34.3  30.0 - 36.0 g/dL   RDW 40.9  81.1 - 91.4 %   Platelets 213  150 - 400 K/uL  COMPREHENSIVE METABOLIC PANEL     Status: Abnormal   Collection Time    05/06/13  8:16 PM      Result Value Range   Sodium 133 (*) 135 - 145 mEq/L   Potassium 3.6  3.5 - 5.1 mEq/L   Chloride 100  96 - 112 mEq/L   CO2 25  19 - 32 mEq/L   Glucose, Bld 72  70 - 99 mg/dL   BUN 6  6 - 23 mg/dL   Creatinine, Ser 7.82  0.50 - 1.10 mg/dL   Calcium 9.2  8.4 - 95.6 mg/dL   Total Protein 6.2  6.0 - 8.3 g/dL   Albumin 3.2 (*) 3.5 - 5.2 g/dL   AST 15  0 - 37 U/L   ALT 13  0 - 35 U/L   Alkaline  Phosphatase 55  39 - 117 U/L   Total Bilirubin 0.1 (*) 0.3 - 1.2 mg/dL  GFR calc non Af Amer >90  >90 mL/min   GFR calc Af Amer >90  >90 mL/min  WET PREP, GENITAL     Status: Abnormal   Collection Time    05/06/13  8:20 PM      Result Value Range   Yeast Wet Prep HPF POC NONE SEEN  NONE SEEN   Trich, Wet Prep NONE SEEN  NONE SEEN   Clue Cells Wet Prep HPF POC FEW (*) NONE SEEN   WBC, Wet Prep HPF POC FEW (*) NONE SEEN   IVF with IV meds offered to pt- pt and mother declined due to time contraint Pt will try oral meds(Fioricet) and return tomorrow for IVF if not improved GI cocktail given with relief of heartburn GC/Chlamydia pending   Assessment and Plan  Headache in pregnancy([redacted]w[redacted]d)- Fioricet 2 tabs given GERD- omeprazole; pt education material Abdominal pain- suspect round ligament- pt information F/u in OB clinic as scheduled  Seira Cody 05/06/2013, 8:06 PM

## 2013-05-07 NOTE — MAU Provider Note (Signed)
Attestation of Attending Supervision of Advanced Practitioner: Evaluation and management procedures were performed by the PA/NP/CNM/OB Fellow under my supervision/collaboration. Chart reviewed and agree with management and plan.  Londen Bok V 05/07/2013 10:04 PM

## 2013-05-09 LAB — GC/CHLAMYDIA PROBE AMP: CT Probe RNA: POSITIVE — AB

## 2013-05-11 ENCOUNTER — Other Ambulatory Visit: Payer: Self-pay | Admitting: Obstetrics & Gynecology

## 2013-05-11 DIAGNOSIS — Z0489 Encounter for examination and observation for other specified reasons: Secondary | ICD-10-CM

## 2013-05-13 ENCOUNTER — Ambulatory Visit (HOSPITAL_COMMUNITY)
Admission: RE | Admit: 2013-05-13 | Discharge: 2013-05-13 | Disposition: A | Discharge status: Home / Self Care | Payer: Medicaid Other | Source: Ambulatory Visit | Attending: Obstetrics & Gynecology | Admitting: Obstetrics & Gynecology

## 2013-05-13 DIAGNOSIS — Z363 Encounter for antenatal screening for malformations: Secondary | ICD-10-CM | POA: Insufficient documentation

## 2013-05-13 DIAGNOSIS — Z0489 Encounter for examination and observation for other specified reasons: Secondary | ICD-10-CM

## 2013-05-13 DIAGNOSIS — O358XX0 Maternal care for other (suspected) fetal abnormality and damage, not applicable or unspecified: Secondary | ICD-10-CM | POA: Insufficient documentation

## 2013-05-13 DIAGNOSIS — O99330 Smoking (tobacco) complicating pregnancy, unspecified trimester: Secondary | ICD-10-CM | POA: Insufficient documentation

## 2013-05-13 DIAGNOSIS — Z1389 Encounter for screening for other disorder: Secondary | ICD-10-CM | POA: Insufficient documentation

## 2013-05-13 DIAGNOSIS — O2620 Pregnancy care for patient with recurrent pregnancy loss, unspecified trimester: Secondary | ICD-10-CM | POA: Insufficient documentation

## 2013-05-16 ENCOUNTER — Encounter: Payer: Self-pay | Admitting: Obstetrics & Gynecology

## 2013-05-16 DIAGNOSIS — O99332 Smoking (tobacco) complicating pregnancy, second trimester: Secondary | ICD-10-CM | POA: Insufficient documentation

## 2013-05-26 ENCOUNTER — Ambulatory Visit (INDEPENDENT_AMBULATORY_CARE_PROVIDER_SITE_OTHER): Payer: Medicaid Other | Admitting: Obstetrics & Gynecology

## 2013-05-26 VITALS — BP 117/69 | Temp 97.7°F | Wt 197.5 lb

## 2013-05-26 DIAGNOSIS — O2620 Pregnancy care for patient with recurrent pregnancy loss, unspecified trimester: Secondary | ICD-10-CM

## 2013-05-26 DIAGNOSIS — A6009 Herpesviral infection of other urogenital tract: Secondary | ICD-10-CM

## 2013-05-26 DIAGNOSIS — Z23 Encounter for immunization: Secondary | ICD-10-CM

## 2013-05-26 DIAGNOSIS — O2622 Pregnancy care for patient with recurrent pregnancy loss, second trimester: Secondary | ICD-10-CM

## 2013-05-26 DIAGNOSIS — O98319 Other infections with a predominantly sexual mode of transmission complicating pregnancy, unspecified trimester: Secondary | ICD-10-CM

## 2013-05-26 DIAGNOSIS — K5900 Constipation, unspecified: Secondary | ICD-10-CM

## 2013-05-26 DIAGNOSIS — A6000 Herpesviral infection of urogenital system, unspecified: Secondary | ICD-10-CM

## 2013-05-26 DIAGNOSIS — O98519 Other viral diseases complicating pregnancy, unspecified trimester: Secondary | ICD-10-CM

## 2013-05-26 DIAGNOSIS — N9089 Other specified noninflammatory disorders of vulva and perineum: Secondary | ICD-10-CM

## 2013-05-26 DIAGNOSIS — R110 Nausea: Secondary | ICD-10-CM

## 2013-05-26 DIAGNOSIS — O360990 Maternal care for other rhesus isoimmunization, unspecified trimester, not applicable or unspecified: Secondary | ICD-10-CM

## 2013-05-26 DIAGNOSIS — A749 Chlamydial infection, unspecified: Secondary | ICD-10-CM

## 2013-05-26 DIAGNOSIS — A568 Sexually transmitted chlamydial infection of other sites: Secondary | ICD-10-CM

## 2013-05-26 DIAGNOSIS — B009 Herpesviral infection, unspecified: Secondary | ICD-10-CM

## 2013-05-26 LAB — POCT URINALYSIS DIP (DEVICE)
Bilirubin Urine: NEGATIVE
Ketones, ur: NEGATIVE mg/dL
Protein, ur: 30 mg/dL — AB
Specific Gravity, Urine: 1.025 (ref 1.005–1.030)
Urobilinogen, UA: 0.2 mg/dL (ref 0.0–1.0)
pH: 7 (ref 5.0–8.0)

## 2013-05-26 MED ORDER — AZITHROMYCIN 250 MG PO TABS
1000.0000 mg | ORAL_TABLET | Freq: Once | ORAL | Status: AC
  Administered 2013-05-26: 1000 mg via ORAL

## 2013-05-26 NOTE — Addendum Note (Signed)
Addended by: Franchot Mimes on: 05/26/2013 12:06 PM   Modules accepted: Orders

## 2013-05-26 NOTE — Progress Notes (Signed)
Diagnosed with Chlamydia on 05/06/13 in MAU, patient did not pick up Rx, Azithromycin given today in clinic On external pelvic exam, 7 mm wide lesion with multiple blisters noted with erythema and tenderness to touch.  HSV culture obtained, HSV I and II IgM and IgG will also be checked.  Patient declines presumptive treatment; will follow up results and manage accordingly. Quad screen today given inadequate first trimester screen.  Normal anatomy scan.  Patient complains of breast leaking small amounts of milk bilaterally, recommended OTC breast pads to help in not staining clothes, reassured that this can occur in pregnancy and is normal No other complaints or concerns.  Routine obstetric precautions reviewed.

## 2013-05-26 NOTE — Progress Notes (Signed)
P=84 Pt. States she did not pick up azythromycin at pharmacy and needs to be treated today for positive chlamydia in MAU. C/o of bump on outside of vagina that "is open and itches and hurts."

## 2013-05-26 NOTE — Patient Instructions (Signed)
Return to clinic for any obstetric concerns or go to MAU for evaluation  

## 2013-05-27 LAB — HSV(HERPES SMPLX)ABS-I+II(IGG+IGM)-BLD
HSV 1 Glycoprotein G Ab, IgG: 9.04 IV — ABNORMAL HIGH
HSV 2 Glycoprotein G Ab, IgG: 8.68 IV — ABNORMAL HIGH
Herpes Simplex Vrs I&II-IgM Ab (EIA): 1.26 INDEX — ABNORMAL HIGH

## 2013-05-27 LAB — AFP, QUAD SCREEN
AFP: 44.8 IU/mL
Curr Gest Age: 19.5 wks.days
Down Syndrome Scr Risk Est: 1:20400 {titer}
INH: 119.1 pg/mL
MoM for AFP: 1
MoM for INH: 0.76
MoM for hCG: 0.9
Osb Risk: 1:26700 {titer}
Tri 18 Scr Risk Est: NEGATIVE
Trisomy 18 (Edward) Syndrome Interp.: 1:24300 {titer}
uE3 Mom: 0.71
uE3 Value: 0.8 ng/mL

## 2013-05-30 LAB — HERPES SIMPLEX VIRUS CULTURE: Organism ID, Bacteria: DETECTED

## 2013-06-01 ENCOUNTER — Telehealth: Payer: Self-pay | Admitting: *Deleted

## 2013-06-01 DIAGNOSIS — A6009 Herpesviral infection of other urogenital tract: Secondary | ICD-10-CM | POA: Insufficient documentation

## 2013-06-01 MED ORDER — VALACYCLOVIR HCL 500 MG PO TABS: ORAL_TABLET | ORAL | Status: DC

## 2013-06-01 NOTE — Telephone Encounter (Addendum)
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  Recommend that she needs to let partner(s) know so the partner(s) can get testing and treatment. No unprotected intercourse until she and her partner(s) are treated. Please call to inform patient of results and recommendations.   ------  1210   Called pt on mobile number and heard message stating that the person dialed is not available- unable to leave a message.  I then called her home number and was told it was a wrong number for [employer]. Will try pt again next week on mobile telephone number.  Satoru Milich RNC

## 2013-06-01 NOTE — Addendum Note (Signed)
Addended by: Jaynie Collins A on: 06/01/2013 12:21 AM   Modules accepted: Orders

## 2013-06-07 ENCOUNTER — Encounter: Payer: Self-pay | Admitting: *Deleted

## 2013-06-07 NOTE — Telephone Encounter (Signed)
Attempted to call pt.  Unable to leave message. Letter sent.

## 2013-06-09 NOTE — L&D Delivery Note (Signed)
Operative Delivery Note At 5:08 PM a viable female was delivered via Vaginal, Vacuum Investment banker, operational(Extractor).  Presentation: vertex; Position: Left,, Occiput,, Anterior; Station: +5.  Verbal consent: unable to obtain verbal consent due to emergency.  Risks and benefits discussed in detail.  Risks include, but are not limited to the risks of anesthesia, bleeding, infection, damage to maternal tissues, fetal cephalhematoma.  There is also the risk of inability to effect vaginal delivery of the head, or shoulder dystocia that cannot be resolved by established maneuvers, leading to the need for emergency cesarean section.  APGAR: , ; weight .   Placenta status: Intact, Spontaneous.   Cord: 3 vessels with the following complications: None.  Cord pH:7.18  Anesthesia: Epidural  Instruments: Kiwi vacuum Episiotomy: None Lacerations: Periurethral Suture Repair: 4.0 vicryl on SH Est. Blood Loss (mL): 400  Mom to postpartum.  Baby to Couplet care / Skin to Skin  Called to delivery. Infant with deep decels  When crowning. Outlet vacuum placed and with one push/pull delivered over intact perineum c/w periclitorral tear. Infant delivered to maternal abdomen. Cord clamped and cut. Active management of 3rd stage with traction. Placenta delivered intact with 3v cord. Tear repaired with 4.0 vicryl on SH in usual manner required a figure of 8. EBL400. Counts correct. Hemostatic.   Minta BalsamMichael R Skip Litke 10/13/2013, 5:43 PM

## 2013-06-23 ENCOUNTER — Ambulatory Visit (INDEPENDENT_AMBULATORY_CARE_PROVIDER_SITE_OTHER): Payer: Medicaid Other | Admitting: Family Medicine

## 2013-06-23 VITALS — BP 114/64 | Temp 97.6°F | Wt 206.1 lb

## 2013-06-23 DIAGNOSIS — A6009 Herpesviral infection of other urogenital tract: Secondary | ICD-10-CM

## 2013-06-23 DIAGNOSIS — Z6791 Unspecified blood type, Rh negative: Secondary | ICD-10-CM

## 2013-06-23 DIAGNOSIS — O0990 Supervision of high risk pregnancy, unspecified, unspecified trimester: Secondary | ICD-10-CM

## 2013-06-23 DIAGNOSIS — O26899 Other specified pregnancy related conditions, unspecified trimester: Secondary | ICD-10-CM

## 2013-06-23 DIAGNOSIS — O98519 Other viral diseases complicating pregnancy, unspecified trimester: Secondary | ICD-10-CM

## 2013-06-23 DIAGNOSIS — O98312 Other infections with a predominantly sexual mode of transmission complicating pregnancy, second trimester: Principal | ICD-10-CM

## 2013-06-23 DIAGNOSIS — A6000 Herpesviral infection of urogenital system, unspecified: Secondary | ICD-10-CM

## 2013-06-23 DIAGNOSIS — O360990 Maternal care for other rhesus isoimmunization, unspecified trimester, not applicable or unspecified: Secondary | ICD-10-CM

## 2013-06-23 LAB — POCT URINALYSIS DIP (DEVICE)
Bilirubin Urine: NEGATIVE
GLUCOSE, UA: NEGATIVE mg/dL
Hgb urine dipstick: NEGATIVE
Ketones, ur: NEGATIVE mg/dL
LEUKOCYTES UA: NEGATIVE
NITRITE: NEGATIVE
Protein, ur: NEGATIVE mg/dL
Urobilinogen, UA: 0.2 mg/dL (ref 0.0–1.0)
pH: 5.5 (ref 5.0–8.0)

## 2013-06-23 MED ORDER — RANITIDINE HCL 150 MG PO TABS: 150.0000 mg | ORAL_TABLET | Freq: Two times a day (BID) | ORAL | Status: DC

## 2013-06-23 NOTE — Progress Notes (Signed)
Pulse: 76

## 2013-06-23 NOTE — Patient Instructions (Signed)
Second Trimester of Pregnancy The second trimester is from week 13 through week 28, months 4 through 6. The second trimester is often a time when you feel your best. Your body has also adjusted to being pregnant, and you begin to feel better physically. Usually, morning sickness has lessened or quit completely, you may have more energy, and you may have an increase in appetite. The second trimester is also a time when the fetus is growing rapidly. At the end of the sixth month, the fetus is about 9 inches long and weighs about 1 pounds. You will likely begin to feel the baby move (quickening) between 18 and 20 weeks of the pregnancy. BODY CHANGES Your body goes through many changes during pregnancy. The changes vary from woman to woman.   Your weight will continue to increase. You will notice your lower abdomen bulging out.  You may begin to get stretch marks on your hips, abdomen, and breasts.  You may develop headaches that can be relieved by medicines approved by your caregiver.  You may urinate more often because the fetus is pressing on your bladder.  You may develop or continue to have heartburn as a result of your pregnancy.  You may develop constipation because certain hormones are causing the muscles that push waste through your intestines to slow down.  You may develop hemorrhoids or swollen, bulging veins (varicose veins).  You may have back pain because of the weight gain and pregnancy hormones relaxing your joints between the bones in your pelvis and as a result of a shift in weight and the muscles that support your balance.  Your breasts will continue to grow and be tender.  Your gums may bleed and may be sensitive to brushing and flossing.  Dark spots or blotches (chloasma, mask of pregnancy) may develop on your face. This will likely fade after the baby is born.  A dark line from your belly button to the pubic area (linea nigra) may appear. This will likely fade after the  baby is born. WHAT TO EXPECT AT YOUR PRENATAL VISITS During a routine prenatal visit:  You will be weighed to make sure you and the fetus are growing normally.  Your blood pressure will be taken.  Your abdomen will be measured to track your baby's growth.  The fetal heartbeat will be listened to.  Any test results from the previous visit will be discussed. Your caregiver may ask you:  How you are feeling.  If you are feeling the baby move.  If you have had any abnormal symptoms, such as leaking fluid, bleeding, severe headaches, or abdominal cramping.  If you have any questions. Other tests that may be performed during your second trimester include:  Blood tests that check for:  Low iron levels (anemia).  Gestational diabetes (between 24 and 28 weeks).  Rh antibodies.  Urine tests to check for infections, diabetes, or protein in the urine.  An ultrasound to confirm the proper growth and development of the baby.  An amniocentesis to check for possible genetic problems.  Fetal screens for spina bifida and Down syndrome. HOME CARE INSTRUCTIONS   Avoid all smoking, herbs, alcohol, and unprescribed drugs. These chemicals affect the formation and growth of the baby.  Follow your caregiver's instructions regarding medicine use. There are medicines that are either safe or unsafe to take during pregnancy.  Exercise only as directed by your caregiver. Experiencing uterine cramps is a good sign to stop exercising.  Continue to eat regular,   healthy meals.  Wear a good support bra for breast tenderness.  Do not use hot tubs, steam rooms, or saunas.  Wear your seat belt at all times when driving.  Avoid raw meat, uncooked cheese, cat litter boxes, and soil used by cats. These carry germs that can cause birth defects in the baby.  Take your prenatal vitamins.  Try taking a stool softener (if your caregiver approves) if you develop constipation. Eat more high-fiber foods,  such as fresh vegetables or fruit and whole grains. Drink plenty of fluids to keep your urine clear or pale yellow.  Take warm sitz baths to soothe any pain or discomfort caused by hemorrhoids. Use hemorrhoid cream if your caregiver approves.  If you develop varicose veins, wear support hose. Elevate your feet for 15 minutes, 3 4 times a day. Limit salt in your diet.  Avoid heavy lifting, wear low heel shoes, and practice good posture.  Rest with your legs elevated if you have leg cramps or low back pain.  Visit your dentist if you have not gone yet during your pregnancy. Use a soft toothbrush to brush your teeth and be gentle when you floss.  A sexual relationship may be continued unless your caregiver directs you otherwise.  Continue to go to all your prenatal visits as directed by your caregiver. SEEK MEDICAL CARE IF:   You have dizziness.  You have mild pelvic cramps, pelvic pressure, or nagging pain in the abdominal area.  You have persistent nausea, vomiting, or diarrhea.  You have a bad smelling vaginal discharge.  You have pain with urination. SEEK IMMEDIATE MEDICAL CARE IF:   You have a fever.  You are leaking fluid from your vagina.  You have spotting or bleeding from your vagina.  You have severe abdominal cramping or pain.  You have rapid weight gain or loss.  You have shortness of breath with chest pain.  You notice sudden or extreme swelling of your face, hands, ankles, feet, or legs.  You have not felt your baby move in over an hour.  You have severe headaches that do not go away with medicine.  You have vision changes. Document Released: 05/20/2001 Document Revised: 01/26/2013 Document Reviewed: 07/27/2012 [employer] Patient Information 2014 [employer].  

## 2013-06-23 NOTE — Progress Notes (Signed)
S: 23 yo G4P0030 @ 3148w5d here for HROBV. HR for recurrent miscarriage.  - having bad heartburn. Doing home remedies - would like a real medicine - no ctx, lof, Vb. +FM - previous hsv outbreak- now resolved.  O: see flowsheet  A/P:   - doing well - ranitidine rx today. - hx of chlamydia but treated 12/18. Will need TOC at next visit - Rh negative and will need rhogam at next visit - 28 week labs at next visit - will need acyclovir suppression.

## 2013-07-21 ENCOUNTER — Ambulatory Visit (INDEPENDENT_AMBULATORY_CARE_PROVIDER_SITE_OTHER): Payer: Medicaid Other | Admitting: Family Medicine

## 2013-07-21 ENCOUNTER — Encounter: Payer: Self-pay | Admitting: Family Medicine

## 2013-07-21 VITALS — BP 108/65 | Temp 97.2°F | Wt 215.9 lb

## 2013-07-21 DIAGNOSIS — O99332 Smoking (tobacco) complicating pregnancy, second trimester: Secondary | ICD-10-CM

## 2013-07-21 DIAGNOSIS — O98819 Other maternal infectious and parasitic diseases complicating pregnancy, unspecified trimester: Secondary | ICD-10-CM

## 2013-07-21 DIAGNOSIS — O0990 Supervision of high risk pregnancy, unspecified, unspecified trimester: Secondary | ICD-10-CM

## 2013-07-21 DIAGNOSIS — Z6791 Unspecified blood type, Rh negative: Secondary | ICD-10-CM

## 2013-07-21 DIAGNOSIS — O99330 Smoking (tobacco) complicating pregnancy, unspecified trimester: Secondary | ICD-10-CM

## 2013-07-21 DIAGNOSIS — Z23 Encounter for immunization: Secondary | ICD-10-CM

## 2013-07-21 DIAGNOSIS — O09899 Supervision of other high risk pregnancies, unspecified trimester: Secondary | ICD-10-CM

## 2013-07-21 DIAGNOSIS — A749 Chlamydial infection, unspecified: Secondary | ICD-10-CM

## 2013-07-21 DIAGNOSIS — O360990 Maternal care for other rhesus isoimmunization, unspecified trimester, not applicable or unspecified: Secondary | ICD-10-CM

## 2013-07-21 DIAGNOSIS — O26899 Other specified pregnancy related conditions, unspecified trimester: Secondary | ICD-10-CM

## 2013-07-21 DIAGNOSIS — O98812 Other maternal infectious and parasitic diseases complicating pregnancy, second trimester: Secondary | ICD-10-CM

## 2013-07-21 LAB — CBC
HCT: 31.2 % — ABNORMAL LOW (ref 36.0–46.0)
Hemoglobin: 10.8 g/dL — ABNORMAL LOW (ref 12.0–15.0)
MCH: 31.8 pg (ref 26.0–34.0)
MCHC: 34.6 g/dL (ref 30.0–36.0)
MCV: 91.8 fL (ref 78.0–100.0)
PLATELETS: 218 10*3/uL (ref 150–400)
RBC: 3.4 MIL/uL — ABNORMAL LOW (ref 3.87–5.11)
RDW: 13.5 % (ref 11.5–15.5)
WBC: 11.8 10*3/uL — AB (ref 4.0–10.5)

## 2013-07-21 LAB — RPR

## 2013-07-21 LAB — GLUCOSE TOLERANCE, 1 HOUR (50G) W/O FASTING: Glucose, 1 Hour GTT: 73 mg/dL (ref 70–140)

## 2013-07-21 MED ORDER — TETANUS-DIPHTH-ACELL PERTUSSIS 5-2.5-18.5 LF-MCG/0.5 IM SUSP
0.5000 mL | Freq: Once | INTRAMUSCULAR | Status: AC
  Administered 2013-07-21: 0.5 mL via INTRAMUSCULAR

## 2013-07-21 MED ORDER — RHO D IMMUNE GLOBULIN 1500 UNIT/2ML IJ SOLN
300.0000 ug | Freq: Once | INTRAMUSCULAR | Status: AC
  Administered 2013-07-21: 300 ug via INTRAMUSCULAR

## 2013-07-21 NOTE — Progress Notes (Signed)
P= 87  Occasional edema in ankles and feet.  C.o if intermmitent pressure back and hips.  CBC, RPR and 1hr gtt today. Rhogam today.

## 2013-07-21 NOTE — Patient Instructions (Signed)
Third Trimester of Pregnancy  The third trimester is from week 29 through week 42, months 7 through 9. The third trimester is a time when the fetus is growing rapidly. At the end of the ninth month, the fetus is about 20 inches in length and weighs 6 10 pounds.   BODY CHANGES  Your body goes through many changes during pregnancy. The changes vary from woman to woman.    Your weight will continue to increase. You can expect to gain 25 35 pounds (11 16 kg) by the end of the pregnancy.   You may begin to get stretch marks on your hips, abdomen, and breasts.   You may urinate more often because the fetus is moving lower into your pelvis and pressing on your bladder.   You may develop or continue to have heartburn as a result of your pregnancy.   You may develop constipation because certain hormones are causing the muscles that push waste through your intestines to slow down.   You may develop hemorrhoids or swollen, bulging veins (varicose veins).   You may have pelvic pain because of the weight gain and pregnancy hormones relaxing your joints between the bones in your pelvis. Back aches may result from over exertion of the muscles supporting your posture.   Your breasts will continue to grow and be tender. A yellow discharge may leak from your breasts called colostrum.   Your belly button may stick out.   You may feel short of breath because of your expanding uterus.   You may notice the fetus "dropping," or moving lower in your abdomen.   You may have a bloody mucus discharge. This usually occurs a few days to a week before labor begins.   Your cervix becomes thin and soft (effaced) near your due date.  WHAT TO EXPECT AT YOUR PRENATAL EXAMS   You will have prenatal exams every 2 weeks until week 36. Then, you will have weekly prenatal exams. During a routine prenatal visit:   You will be weighed to make sure you and the fetus are growing normally.   Your blood pressure is taken.   Your abdomen will be  measured to track your baby's growth.   The fetal heartbeat will be listened to.   Any test results from the previous visit will be discussed.   You may have a cervical check near your due date to see if you have effaced.  At around 36 weeks, your caregiver will check your cervix. At the same time, your caregiver will also perform a test on the secretions of the vaginal tissue. This test is to determine if a type of bacteria, Group B streptococcus, is present. Your caregiver will explain this further.  Your caregiver may ask you:   What your birth plan is.   How you are feeling.   If you are feeling the baby move.   If you have had any abnormal symptoms, such as leaking fluid, bleeding, severe headaches, or abdominal cramping.   If you have any questions.  Other tests or screenings that may be performed during your third trimester include:   Blood tests that check for low iron levels (anemia).   Fetal testing to check the health, activity level, and growth of the fetus. Testing is done if you have certain medical conditions or if there are problems during the pregnancy.  FALSE LABOR  You may feel small, irregular contractions that eventually go away. These are called Braxton Hicks contractions, or   false labor. Contractions may last for hours, days, or even weeks before true labor sets in. If contractions come at regular intervals, intensify, or become painful, it is best to be seen by your caregiver.   SIGNS OF LABOR    Menstrual-like cramps.   Contractions that are 5 minutes apart or less.   Contractions that start on the top of the uterus and spread down to the lower abdomen and back.   A sense of increased pelvic pressure or back pain.   A watery or bloody mucus discharge that comes from the vagina.  If you have any of these signs before the 37th week of pregnancy, call your caregiver right away. You need to go to the hospital to get checked immediately.  HOME CARE INSTRUCTIONS    Avoid all  smoking, herbs, alcohol, and unprescribed drugs. These chemicals affect the formation and growth of the baby.   Follow your caregiver's instructions regarding medicine use. There are medicines that are either safe or unsafe to take during pregnancy.   Exercise only as directed by your caregiver. Experiencing uterine cramps is a good sign to stop exercising.   Continue to eat regular, healthy meals.   Wear a good support bra for breast tenderness.   Do not use hot tubs, steam rooms, or saunas.   Wear your seat belt at all times when driving.   Avoid raw meat, uncooked cheese, cat litter boxes, and soil used by cats. These carry germs that can cause birth defects in the baby.   Take your prenatal vitamins.   Try taking a stool softener (if your caregiver approves) if you develop constipation. Eat more high-fiber foods, such as fresh vegetables or fruit and whole grains. Drink plenty of fluids to keep your urine clear or pale yellow.   Take warm sitz baths to soothe any pain or discomfort caused by hemorrhoids. Use hemorrhoid cream if your caregiver approves.   If you develop varicose veins, wear support hose. Elevate your feet for 15 minutes, 3 4 times a day. Limit salt in your diet.   Avoid heavy lifting, wear low heal shoes, and practice good posture.   Rest a lot with your legs elevated if you have leg cramps or low back pain.   Visit your dentist if you have not gone during your pregnancy. Use a soft toothbrush to brush your teeth and be gentle when you floss.   A sexual relationship may be continued unless your caregiver directs you otherwise.   Do not travel far distances unless it is absolutely necessary and only with the approval of your caregiver.   Take prenatal classes to understand, practice, and ask questions about the labor and delivery.   Make a trial run to the hospital.   Pack your hospital bag.   Prepare the baby's nursery.   Continue to go to all your prenatal visits as directed  by your caregiver.  SEEK MEDICAL CARE IF:   You are unsure if you are in labor or if your water has broken.   You have dizziness.   You have mild pelvic cramps, pelvic pressure, or nagging pain in your abdominal area.   You have persistent nausea, vomiting, or diarrhea.   You have a bad smelling vaginal discharge.   You have pain with urination.  SEEK IMMEDIATE MEDICAL CARE IF:    You have a fever.   You are leaking fluid from your vagina.   You have spotting or bleeding from your vagina.     You have severe abdominal cramping or pain.   You have rapid weight loss or gain.   You have shortness of breath with chest pain.   You notice sudden or extreme swelling of your face, hands, ankles, feet, or legs.   You have not felt your baby move in over an hour.   You have severe headaches that do not go away with medicine.   You have vision changes.  Document Released: 05/20/2001 Document Revised: 01/26/2013 Document Reviewed: 07/27/2012  [employer] Patient Information 2014 [employer].

## 2013-07-21 NOTE — Progress Notes (Signed)
+  FM, no lof, no vb , no ctx TOC - today HSV - cont valtrex Hx of miscarriage - outside of prior losses Rhogam today Tdap today  Cheyenne Walton is a 23 y.o. G4P0030 at [id number] here for ROB visit.  Discussed with Patient:  -Plans to breast feed.  All questions answered. -Continue prenatal vitamins. -Reviewed fetal kick counts (Pt to perform daily at a time when the baby is active, lie laterally with both hands on belly in quiet room and count all movements (hiccups, shoulder rolls, obvious kicks, etc); pt is to report to clinic or MAU for less than 10 movements felt in a one hour time period-pt told as soon as she counts 10 movements the count is complete.)  - Routine precautions discussed (depression, infection s/s).   Patient provided with all pertinent phone numbers for emergencies. - RTC for any VB, regular, painful cramps/ctxs occurring at a rate of >2/10 min, fever (100.5 or higher), n/v/d, any pain that is unresolving or worsening, LOF, decreased fetal movement, CP, SOB, edema  Problems: Patient Active Problem List   Diagnosis Date Noted  . Genital herpes complicating pregnancy in second trimester 06/01/2013  . Chlamydia infection complicating pregnancy in second trimester 05/26/2013  . High risk pregnancy due to smoking in second trimester 05/16/2013  . Rh negative state in antepartum period 03/31/2013  . Three previous miscarriages, affecting care of mother, antepartum 03/14/2013  . Supervision of high-risk pregnancy 03/10/2013    To Do: 1. Glucose tolerance test ordered.  Patient will draw in clinic.  Will f/u test and amend plan based on results. 2. CBC and antibody screen ordered. 3. Rhogam given (if Rh-)  [x ] Vaccines: Flu: recd Tdap: today [x ] BCM: nexplanon  Edu: [x]  PTL precautions; [ ]  BF class; [ ]  childbirth class; [ ]   BF counseling;

## 2013-07-22 ENCOUNTER — Encounter: Payer: Self-pay | Admitting: *Deleted

## 2013-07-22 LAB — GC/CHLAMYDIA PROBE AMP
CT PROBE, AMP APTIMA: NEGATIVE
GC PROBE AMP APTIMA: NEGATIVE

## 2013-08-04 ENCOUNTER — Encounter: Payer: Medicaid Other | Admitting: Family

## 2013-08-05 ENCOUNTER — Encounter (HOSPITAL_COMMUNITY): Payer: Self-pay

## 2013-08-05 ENCOUNTER — Inpatient Hospital Stay (HOSPITAL_COMMUNITY)
Admission: AD | Admit: 2013-08-05 | Discharge: 2013-08-05 | Disposition: A | Discharge status: Home / Self Care | Payer: Medicaid Other | Source: Ambulatory Visit | Attending: Obstetrics & Gynecology | Admitting: Obstetrics & Gynecology

## 2013-08-05 DIAGNOSIS — Z87891 Personal history of nicotine dependence: Secondary | ICD-10-CM | POA: Insufficient documentation

## 2013-08-05 DIAGNOSIS — M545 Low back pain, unspecified: Secondary | ICD-10-CM | POA: Insufficient documentation

## 2013-08-05 DIAGNOSIS — O9989 Other specified diseases and conditions complicating pregnancy, childbirth and the puerperium: Principal | ICD-10-CM

## 2013-08-05 DIAGNOSIS — O479 False labor, unspecified: Secondary | ICD-10-CM

## 2013-08-05 DIAGNOSIS — M7918 Myalgia, other site: Secondary | ICD-10-CM

## 2013-08-05 DIAGNOSIS — M25559 Pain in unspecified hip: Secondary | ICD-10-CM | POA: Insufficient documentation

## 2013-08-05 DIAGNOSIS — R109 Unspecified abdominal pain: Secondary | ICD-10-CM | POA: Insufficient documentation

## 2013-08-05 DIAGNOSIS — IMO0001 Reserved for inherently not codable concepts without codable children: Secondary | ICD-10-CM

## 2013-08-05 DIAGNOSIS — O99891 Other specified diseases and conditions complicating pregnancy: Secondary | ICD-10-CM | POA: Insufficient documentation

## 2013-08-05 DIAGNOSIS — O4700 False labor before 37 completed weeks of gestation, unspecified trimester: Secondary | ICD-10-CM

## 2013-08-05 LAB — URINALYSIS, ROUTINE W REFLEX MICROSCOPIC
BILIRUBIN URINE: NEGATIVE
Glucose, UA: NEGATIVE mg/dL
HGB URINE DIPSTICK: NEGATIVE
KETONES UR: NEGATIVE mg/dL
Leukocytes, UA: NEGATIVE
Nitrite: NEGATIVE
Protein, ur: NEGATIVE mg/dL
Specific Gravity, Urine: 1.025 (ref 1.005–1.030)
UROBILINOGEN UA: 0.2 mg/dL (ref 0.0–1.0)
pH: 6.5 (ref 5.0–8.0)

## 2013-08-05 LAB — POCT FERN TEST: POCT Fern Test: NEGATIVE — NL

## 2013-08-05 MED ORDER — CYCLOBENZAPRINE HCL 5 MG PO TABS: 5.0000 mg | ORAL_TABLET | Freq: Three times a day (TID) | ORAL | Status: DC | PRN

## 2013-08-05 MED ORDER — CYCLOBENZAPRINE HCL 5 MG PO TABS
5.0000 mg | ORAL_TABLET | Freq: Once | ORAL | Status: AC
  Administered 2013-08-05: 5 mg via ORAL
  Filled 2013-08-05: qty 1

## 2013-08-05 MED ORDER — NIFEDIPINE 10 MG PO CAPS: 10.0000 mg | ORAL_CAPSULE | Freq: Once | ORAL | Status: DC

## 2013-08-05 MED ORDER — ACETAMINOPHEN 500 MG PO TABS
1000.0000 mg | ORAL_TABLET | Freq: Once | ORAL | Status: AC
  Administered 2013-08-05: 1000 mg via ORAL
  Filled 2013-08-05: qty 2

## 2013-08-05 NOTE — Progress Notes (Signed)
Notified of pt arrival in MAU. Will come see pt 

## 2013-08-05 NOTE — MAU Provider Note (Signed)
History     CSN: [id number]  Arrival date and time: 08/05/13 2009   None     Chief Complaint  Patient presents with  . Back Pain  . Abdominal Cramping   HPI  23 y/o G4P0030 here with complaints of low back pain, abd cramping, and LOF for 1 week. She states that the first thing she noticed was leakage of clear to white and at times brownish fluid. She is unsure if this is urine but her underwear have been wet every day since. She also notes sharp stabbing 10/10 low back lumbar and BL hip pain that radiates around to her BL lower abdomen and starts a cramping sensation approx 5-10 times daily. Some of the pain on the right radiates into her groin as well. She denies decreased fetal movement, vaginal bleeding, or vaginal irritation with this clear dc.  OB History   Grav Para Term Preterm Abortions TAB SAB Ect Mult Living   4 0 0 0 3 0 2 1 0 0       Past Medical History  Diagnosis Date  . UTI (urinary tract infection)   . Ectopic pregnancy   . Asthma     "haven't used an inhaler in years"    Past Surgical History  Procedure Laterality Date  . Laparoscopy  09/13/2011    Procedure: LAPAROSCOPY OPERATIVE;  Surgeon: Tereso Newcomer, MD;  Location: [facility];  Service: Gynecology;  Laterality: Left;  operative laparoscopy left salpingectomy with removal of ectopic pregnancy.  . Ectopic pregnancy surgery    . Laparoscopic unilateral salpingectomy      LEFT SIDE with ectopic pregnancy    Family History  Problem Relation Age of Onset  . Anesthesia problems Neg Hx   . Hypotension Neg Hx   . Malignant hyperthermia Neg Hx   . Pseudochol deficiency Neg Hx   . Cancer Maternal Grandmother     History  Substance Use Topics  . Smoking status: Former Smoker -- 1.00 packs/day    Quit date: 04/08/2012  . Smokeless tobacco: Never Used  . Alcohol Use: No    Allergies:  Allergies  Allergen Reactions  . Contrast Media [Iodinated Diagnostic Agents] Anaphylaxis  . Other Anaphylaxis, Rash  and Other (See Comments)    Seafood - throat swelling  . Latex Hives    Prescriptions prior to admission  Medication Sig Dispense Refill  . folic acid (FOLVITE) 1 MG tablet Take 1 tablet (1 mg total) by mouth daily.  30 tablet  5  . Prenatal Vit-Fe Fumarate-FA (PRENATAL VITAMINS) 28-0.8 MG TABS Take 1 tablet by mouth daily.  30 tablet  2  . valACYclovir (VALTREX) 500 MG tablet Take two tablets by mouth twice daily for ten days; then one tablet twice daily for remainder of pregnancy  180 tablet  2    ROS No fever, chills, sweats No dyspnea No chest pain No diarrhea  Physical Exam   Blood pressure 115/70, pulse 83, temperature 97.9 F (36.6 C), resp. rate 20, height 5\' 4"  (1.626 m), weight 100.426 kg (221 lb 6.4 oz), last menstrual period 01/08/2013.  Physical Exam  Gen: NAD, alert, cooperative with exam HEENT: NCAT CV: RRR, good S1/S2, no murmur Resp: CTABL, no wheezes, non-labored Abd: Soft, gravid abdomen, no tenderness to palpation.  Ext: No edema, warm MSK: Mild tenderness to palpation of parasoinal muscles on R in L3-L4 area Neuro: Alert and oriented, strength 5/5 and sensation intact on BL LE.   Dilation: Closed Effacement (%): Thick Cervical  Position: Posterior Exam by:: Dr. Ermalinda MemosBradshaw  FHT: Baseline 135, moderate variability, + accels, No decels Toco: Irregular q 2-5 min with irritability--> resolved on recheck  MAU Course  Procedures  MDM Results for orders placed during the hospital encounter of 08/05/13 (from the past 24 hour(s))  URINALYSIS, ROUTINE W REFLEX MICROSCOPIC     Status: None   Collection Time    08/05/13  8:24 PM      Result Value Ref Range   Color, Urine YELLOW  YELLOW   APPearance CLEAR  CLEAR   Specific Gravity, Urine 1.025  1.005 - 1.030   pH 6.5  5.0 - 8.0   Glucose, UA NEGATIVE  NEGATIVE mg/dL   Hgb urine dipstick NEGATIVE  NEGATIVE   Bilirubin Urine NEGATIVE  NEGATIVE   Ketones, ur NEGATIVE  NEGATIVE mg/dL   Protein, ur NEGATIVE   NEGATIVE mg/dL   Urobilinogen, UA 0.2  0.0 - 1.0 mg/dL   Nitrite NEGATIVE  NEGATIVE   Leukocytes, UA NEGATIVE  NEGATIVE  POCT FERN TEST     Status: Normal   Collection Time    08/05/13  9:26 PM      Result Value Ref Range   POCT Fern Test Negative = intact amniotic membranes       Assessment and Plan  23 y/o G4P0030 here at 29.6 with low back pain, LOF,  and crampy abd pain. Likely dehydration related uterine irritability and contractions with a combination of musculoskeletal and round ligament pain.   - Fern negative, cervix closed - Contractionsresolved with PO hydration - Pain improved with flexeril and tylenol.  - Encouraged PO hydration, Rx given for flexeril, Reviewed signs of preterm labor and reasons to return in detail, F/u in OB clinic as scheduled  Kevin FentonBradshaw, Samuel 08/05/2013, 9:11 PM   I have seen and examined this patient and I agree with the above. Cam HaiSHAW, Lourene Hoston 8:50 AM 08/06/2013

## 2013-08-05 NOTE — MAU Note (Signed)
Having lower back pain and lower abd pain for a wk. Worse tonight. No bleeding or leaking. Worse if i stand along time. Feels like legs may give out sometimes

## 2013-08-05 NOTE — Discharge Instructions (Signed)
Drink plenty of fluids  Second Trimester of Pregnancy The second trimester is from week 13 through week 28, months 4 through 6. The second trimester is often a time when you feel your best. Your body has also adjusted to being pregnant, and you begin to feel better physically. Usually, morning sickness has lessened or quit completely, you may have more energy, and you may have an increase in appetite. The second trimester is also a time when the fetus is growing rapidly. At the end of the sixth month, the fetus is about 9 inches long and weighs about 1 pounds. You will likely begin to feel the baby move (quickening) between 18 and 20 weeks of the pregnancy. BODY CHANGES Your body goes through many changes during pregnancy. The changes vary from woman to woman.   Your weight will continue to increase. You will notice your lower abdomen bulging out.  You may begin to get stretch marks on your hips, abdomen, and breasts.  You may develop headaches that can be relieved by medicines approved by your caregiver.  You may urinate more often because the fetus is pressing on your bladder.  You may develop or continue to have heartburn as a result of your pregnancy.  You may develop constipation because certain hormones are causing the muscles that push waste through your intestines to slow down.  You may develop hemorrhoids or swollen, bulging veins (varicose veins).  You may have back pain because of the weight gain and pregnancy hormones relaxing your joints between the bones in your pelvis and as a result of a shift in weight and the muscles that support your balance.  Your breasts will continue to grow and be tender.  Your gums may bleed and may be sensitive to brushing and flossing.  Dark spots or blotches (chloasma, mask of pregnancy) may develop on your face. This will likely fade after the baby is born.  A dark line from your belly button to the pubic area (linea nigra) may appear. This  will likely fade after the baby is born. WHAT TO EXPECT AT YOUR PRENATAL VISITS During a routine prenatal visit:  You will be weighed to make sure you and the fetus are growing normally.  Your blood pressure will be taken.  Your abdomen will be measured to track your baby's growth.  The fetal heartbeat will be listened to.  Any test results from the previous visit will be discussed. Your caregiver may ask you:  How you are feeling.  If you are feeling the baby move.  If you have had any abnormal symptoms, such as leaking fluid, bleeding, severe headaches, or abdominal cramping.  If you have any questions. Other tests that may be performed during your second trimester include:  Blood tests that check for:  Low iron levels (anemia).  Gestational diabetes (between 24 and 28 weeks).  Rh antibodies.  Urine tests to check for infections, diabetes, or protein in the urine.  An ultrasound to confirm the proper growth and development of the baby.  An amniocentesis to check for possible genetic problems.  Fetal screens for spina bifida and Down syndrome. HOME CARE INSTRUCTIONS   Avoid all smoking, herbs, alcohol, and unprescribed drugs. These chemicals affect the formation and growth of the baby.  Follow your caregiver's instructions regarding medicine use. There are medicines that are either safe or unsafe to take during pregnancy.  Exercise only as directed by your caregiver. Experiencing uterine cramps is a good sign to stop exercising.  Continue to eat regular, healthy meals.  Wear a good support bra for breast tenderness.  Do not use hot tubs, steam rooms, or saunas.  Wear your seat belt at all times when driving.  Avoid raw meat, uncooked cheese, cat litter boxes, and soil used by cats. These carry germs that can cause birth defects in the baby.  Take your prenatal vitamins.  Try taking a stool softener (if your caregiver approves) if you develop constipation.  Eat more high-fiber foods, such as fresh vegetables or fruit and whole grains. Drink plenty of fluids to keep your urine clear or pale yellow.  Take warm sitz baths to soothe any pain or discomfort caused by hemorrhoids. Use hemorrhoid cream if your caregiver approves.  If you develop varicose veins, wear support hose. Elevate your feet for 15 minutes, 3 4 times a day. Limit salt in your diet.  Avoid heavy lifting, wear low heel shoes, and practice good posture.  Rest with your legs elevated if you have leg cramps or low back pain.  Visit your dentist if you have not gone yet during your pregnancy. Use a soft toothbrush to brush your teeth and be gentle when you floss.  A sexual relationship may be continued unless your caregiver directs you otherwise.  Continue to go to all your prenatal visits as directed by your caregiver. SEEK MEDICAL CARE IF:   You have dizziness.  You have mild pelvic cramps, pelvic pressure, or nagging pain in the abdominal area.  You have persistent nausea, vomiting, or diarrhea.  You have a bad smelling vaginal discharge.  You have pain with urination. SEEK IMMEDIATE MEDICAL CARE IF:   You have a fever.  You are leaking fluid from your vagina.  You have spotting or bleeding from your vagina.  You have severe abdominal cramping or pain.  You have rapid weight gain or loss.  You have shortness of breath with chest pain.  You notice sudden or extreme swelling of your face, hands, ankles, feet, or legs.  You have not felt your baby move in over an hour.  You have severe headaches that do not go away with medicine.  You have vision changes. Document Released: 05/20/2001 Document Revised: 01/26/2013 Document Reviewed: 07/27/2012 [facility] Patient Information 2014 [place].

## 2013-08-16 ENCOUNTER — Encounter: Payer: Self-pay | Admitting: Obstetrics & Gynecology

## 2013-08-16 ENCOUNTER — Ambulatory Visit (INDEPENDENT_AMBULATORY_CARE_PROVIDER_SITE_OTHER): Payer: Medicaid Other | Admitting: Obstetrics & Gynecology

## 2013-08-16 VITALS — BP 121/66 | Temp 97.1°F | Wt 228.3 lb

## 2013-08-16 DIAGNOSIS — O0990 Supervision of high risk pregnancy, unspecified, unspecified trimester: Secondary | ICD-10-CM

## 2013-08-16 DIAGNOSIS — O99330 Smoking (tobacco) complicating pregnancy, unspecified trimester: Secondary | ICD-10-CM

## 2013-08-16 DIAGNOSIS — O09899 Supervision of other high risk pregnancies, unspecified trimester: Secondary | ICD-10-CM

## 2013-08-16 DIAGNOSIS — O99332 Smoking (tobacco) complicating pregnancy, second trimester: Secondary | ICD-10-CM

## 2013-08-16 LAB — POCT URINALYSIS DIP (DEVICE)
Bilirubin Urine: NEGATIVE
Glucose, UA: NEGATIVE mg/dL
HGB URINE DIPSTICK: NEGATIVE
Ketones, ur: NEGATIVE mg/dL
NITRITE: NEGATIVE
PH: 6 (ref 5.0–8.0)
Protein, ur: NEGATIVE mg/dL
SPECIFIC GRAVITY, URINE: 1.025 (ref 1.005–1.030)
Urobilinogen, UA: 0.2 mg/dL (ref 0.0–1.0)

## 2013-08-16 NOTE — Progress Notes (Signed)
No problems F/u 2 weeks D/W pt PP contraception Rec diet control.  Increased weight gain this pregnancy

## 2013-08-16 NOTE — Patient Instructions (Signed)
Levonorgestrel intrauterine device (IUD) What is this medicine? LEVONORGESTREL IUD (LEE voe nor jes trel) is a contraceptive (birth control) device. The device is placed inside the uterus by a healthcare professional. It is used to prevent pregnancy and can also be used to treat heavy bleeding that occurs during your period. Depending on the device, it can be used for 3 to 5 years. This medicine may be used for other purposes; ask your health care provider or pharmacist if you have questions. COMMON BRAND NAME(S): Mirena, Skyla What should I tell my health care provider before I take this medicine? They need to know if you have any of these conditions: -abnormal Pap smear -cancer of the breast, uterus, or cervix -diabetes -endometritis -genital or pelvic infection now or in the past -have more than one sexual partner or your partner has more than one partner -heart disease -history of an ectopic or tubal pregnancy -immune system problems -IUD in place -liver disease or tumor -problems with blood clots or take blood-thinners -use intravenous drugs -uterus of unusual shape -vaginal bleeding that has not been explained -an unusual or allergic reaction to levonorgestrel, other hormones, silicone, or polyethylene, medicines, foods, dyes, or preservatives -pregnant or trying to get pregnant -breast-feeding How should I use this medicine? This device is placed inside the uterus by a health care professional. Talk to your pediatrician regarding the use of this medicine in children. Special care may be needed. Overdosage: If you think you have taken too much of this medicine contact a poison control center or emergency room at once. NOTE: This medicine is only for you. Do not share this medicine with others. What if I miss a dose? This does not apply. What may interact with this medicine? Do not take this medicine with any of the following  medications: -amprenavir -bosentan -fosamprenavir This medicine may also interact with the following medications: -aprepitant -barbiturate medicines for inducing sleep or treating seizures -bexarotene -griseofulvin -medicines to treat seizures like carbamazepine, ethotoin, felbamate, oxcarbazepine, phenytoin, topiramate -modafinil -pioglitazone -rifabutin -rifampin -rifapentine -some medicines to treat HIV infection like atazanavir, indinavir, lopinavir, nelfinavir, tipranavir, ritonavir -St. John's wort -warfarin This list may not describe all possible interactions. Give your health care provider a list of all the medicines, herbs, non-prescription drugs, or dietary supplements you use. Also tell them if you smoke, drink alcohol, or use illegal drugs. Some items may interact with your medicine. What should I watch for while using this medicine? Visit your doctor or health care professional for regular check ups. See your doctor if you or your partner has sexual contact with others, becomes HIV positive, or gets a sexual transmitted disease. This product does not protect you against HIV infection (AIDS) or other sexually transmitted diseases. You can check the placement of the IUD yourself by reaching up to the top of your vagina with clean fingers to feel the threads. Do not pull on the threads. It is a good habit to check placement after each menstrual period. Call your doctor right away if you feel more of the IUD than just the threads or if you cannot feel the threads at all. The IUD may come out by itself. You may become pregnant if the device comes out. If you notice that the IUD has come out use a backup birth control method like condoms and call your health care provider. Using tampons will not change the position of the IUD and are okay to use during your period. What side effects may I   notice from receiving this medicine? Side effects that you should report to your doctor or  health care professional as soon as possible: -allergic reactions like skin rash, itching or hives, swelling of the face, lips, or tongue -fever, flu-like symptoms -genital sores -high blood pressure -no menstrual period for 6 weeks during use -pain, swelling, warmth in the leg -pelvic pain or tenderness -severe or sudden headache -signs of pregnancy -stomach cramping -sudden shortness of breath -trouble with balance, talking, or walking -unusual vaginal bleeding, discharge -yellowing of the eyes or skin Side effects that usually do not require medical attention (report to your doctor or health care professional if they continue or are bothersome): -acne -breast pain -change in sex drive or performance -changes in weight -cramping, dizziness, or faintness while the device is being inserted -headache -irregular menstrual bleeding within first 3 to 6 months of use -nausea This list may not describe all possible side effects. Call your doctor for medical advice about side effects. You may report side effects to FDA at 1-800-FDA-1088. Where should I keep my medicine? This does not apply. NOTE: This sheet is a summary. It may not cover all possible information. If you have questions about this medicine, talk to your doctor, pharmacist, or health care provider.  2014, [employer]. (2011-06-26 13:54:04)  

## 2013-08-16 NOTE — Progress Notes (Signed)
Pulse- 93  Edema- ankles Pt went to MAU last week for contractions

## 2013-08-30 ENCOUNTER — Encounter: Payer: Self-pay | Admitting: Obstetrics & Gynecology

## 2013-08-30 ENCOUNTER — Ambulatory Visit (INDEPENDENT_AMBULATORY_CARE_PROVIDER_SITE_OTHER): Payer: Medicaid Other | Admitting: Obstetrics & Gynecology

## 2013-08-30 VITALS — BP 116/70 | Temp 97.7°F | Wt 228.8 lb

## 2013-08-30 DIAGNOSIS — O26899 Other specified pregnancy related conditions, unspecified trimester: Secondary | ICD-10-CM

## 2013-08-30 DIAGNOSIS — A6009 Herpesviral infection of other urogenital tract: Secondary | ICD-10-CM

## 2013-08-30 DIAGNOSIS — O98312 Other infections with a predominantly sexual mode of transmission complicating pregnancy, second trimester: Principal | ICD-10-CM

## 2013-08-30 DIAGNOSIS — O360990 Maternal care for other rhesus isoimmunization, unspecified trimester, not applicable or unspecified: Secondary | ICD-10-CM

## 2013-08-30 DIAGNOSIS — A6000 Herpesviral infection of urogenital system, unspecified: Secondary | ICD-10-CM

## 2013-08-30 DIAGNOSIS — O98519 Other viral diseases complicating pregnancy, unspecified trimester: Secondary | ICD-10-CM

## 2013-08-30 DIAGNOSIS — Z6791 Unspecified blood type, Rh negative: Secondary | ICD-10-CM

## 2013-08-30 DIAGNOSIS — O0990 Supervision of high risk pregnancy, unspecified, unspecified trimester: Secondary | ICD-10-CM

## 2013-08-30 LAB — POCT URINALYSIS DIP (DEVICE)
Bilirubin Urine: NEGATIVE
Glucose, UA: NEGATIVE mg/dL
HGB URINE DIPSTICK: NEGATIVE
KETONES UR: NEGATIVE mg/dL
LEUKOCYTES UA: NEGATIVE
Nitrite: NEGATIVE
PH: 6 (ref 5.0–8.0)
PROTEIN: NEGATIVE mg/dL
Specific Gravity, Urine: 1.015 (ref 1.005–1.030)
Urobilinogen, UA: 0.2 mg/dL (ref 0.0–1.0)

## 2013-08-30 NOTE — Progress Notes (Signed)
No problems. +FM, No VB No LOF Pt interested in tub bath

## 2013-08-30 NOTE — Progress Notes (Signed)
P=70 C/o of headaches, nausea and diarrhea for the last 4 days, last episode of diarrhea was yesterday.

## 2013-08-30 NOTE — Patient Instructions (Signed)
Group B Streptococcus Infection During Pregnancy Group B streptococcus (GBS) is a type of bacteria often found in healthy women. GBS usually does not cause any symptoms or harm to healthy adult women, but the bacteria can make a baby very sick if it is passed to the baby during childbirth. GBS is not a sexually transmitted disease (STD). GBS is different from Group A streptococcus, the bacteria that causes "strep throat." CAUSES  GBS bacteria can be found in the intestinal, reproductive, and urinary tracts of women. It can also be found in the female genital tract, most often in the vagina and rectal areas.  SYMPTOMS  In pregnancy, GBS can be in the following places:  Genital tract without symptoms.  Rectum without symptoms.  Urine with or without symptoms (asymptomatic bacteriuria).  Urinary symptoms can include pain, frequency, urgency, and blood with urination (cystitis). Pregnant women who are infected with GBS are at increased risk of:  Early (premature) labor and delivery.  Prolonged rupture of the membranes.  Infection in the following places:  Bladder.  Kidneys (pyelonephritis).  Bag of waters or placenta (chorioamnionitis).  Uterus (endometritis) after delivery. Newborns who are infected with GBS can develop:  Lung infection (pneumonia).  Blood infection (septicemia).  Infection of the lining of the brain and spinal cord (meningitis). DIAGNOSIS  Diagnosis of GBS infection is made by screening tests done when you are 35 to [redacted] weeks pregnant. The test (culture) is an easy swab of the vagina and rectum. A sample of your urine might also be checked for the bacteria. Talk with your caregiver about a plan for labor if your test shows that you carry the GBS bacteria. TREATMENT  If results of the culture are positive, showing that GBS is present, you likely will receive treatment with antibiotic medicines during labor. This will help prevent GBS from being passed to your baby.  The antibiotics work only if they are given during labor. If treatment is given earlier in pregnancy, the bacteria may regrow and be present during labor. Tell your caregiver if you are allergic to penicillin or other antibiotics. Antibiotics are also given if:   Infection of the membranes (amnionitis) is suspected.  Labor begins or there is rupture of the membranes before 37 weeks of pregnancy and there is a high risk of delivering the baby.  The mother has a past history of giving birth to an infant with GBS infection.  The culture status is unknown (culture not performed or result not available) and there is:  Fever during labor.  Preterm labor (less than 37 weeks of pregnancy).  Prolonged rupture of membranes (18 hours or more). Treatment of the mother during labor is not recommended when:  A planned cesarean delivery is done and there is no labor or ruptured membranes. This is true even if the mother tested positive for GBS.  There is a negative culture for GBS screening during the pregnancy, regardless of the risk factors during labor. The infant will receive antibiotics if the infant tested positive for GBS or has signs and symptoms that suggest GBS infection is present. It is not recommended to routinely give antibiotics to infants whose mothers received antibiotic treatment during labor. HOME CARE INSTRUCTIONS   Take all antibiotics as prescribed by your caregiver.  Only take medicine as directed by your caregiver.  Continue with prenatal visits and care.  Return for follow-up appointments and cultures.  Follow your caregiver's instructions. SEEK MEDICAL CARE IF:   You have pain with urination.    You have frequent urination.  You have blood in your urine. SEEK IMMEDIATE MEDICAL CARE IF:  You have a fever.  You have pain in the back, side, or uterus.  You have chills.  You have abdominal swelling (distension) or pain.  You have labor pains (contractions) every  10 minutes or more often.  You are leaking fluid or bleeding from your vagina.  You have pelvic pressure that feels like your baby is pushing down.  You have a low, dull backache.  You have cramps that feel like your period.  You have abdominal cramps with or without diarrhea.  You have repeated vomiting and diarrhea.  You have trouble breathing.  You develop confusion.  You have stiffness of your body or neck. MAKE SURE YOU:   Understand these instructions.  Will watch your condition.  Will get help right away if you are not doing well or get worse. Document Released: 09/02/2007 Document Revised: 08/18/2011 Document Reviewed: 10/06/2010 [employer] Patient Information 2014 [employer].  

## 2013-09-11 ENCOUNTER — Encounter (HOSPITAL_COMMUNITY): Payer: Self-pay | Admitting: *Deleted

## 2013-09-11 ENCOUNTER — Inpatient Hospital Stay (HOSPITAL_COMMUNITY)
Admission: AD | Admit: 2013-09-11 | Discharge: 2013-09-11 | Disposition: A | Discharge status: Home / Self Care | Payer: Medicaid Other | Source: Ambulatory Visit | Attending: Obstetrics and Gynecology | Admitting: Obstetrics and Gynecology

## 2013-09-11 DIAGNOSIS — K5900 Constipation, unspecified: Secondary | ICD-10-CM

## 2013-09-11 DIAGNOSIS — O99330 Smoking (tobacco) complicating pregnancy, unspecified trimester: Secondary | ICD-10-CM

## 2013-09-11 DIAGNOSIS — O98819 Other maternal infectious and parasitic diseases complicating pregnancy, unspecified trimester: Secondary | ICD-10-CM

## 2013-09-11 DIAGNOSIS — A6000 Herpesviral infection of urogenital system, unspecified: Secondary | ICD-10-CM

## 2013-09-11 DIAGNOSIS — A749 Chlamydial infection, unspecified: Secondary | ICD-10-CM

## 2013-09-11 DIAGNOSIS — O99891 Other specified diseases and conditions complicating pregnancy: Secondary | ICD-10-CM

## 2013-09-11 DIAGNOSIS — Z87891 Personal history of nicotine dependence: Secondary | ICD-10-CM | POA: Insufficient documentation

## 2013-09-11 DIAGNOSIS — O2620 Pregnancy care for patient with recurrent pregnancy loss, unspecified trimester: Secondary | ICD-10-CM

## 2013-09-11 DIAGNOSIS — O09899 Supervision of other high risk pregnancies, unspecified trimester: Secondary | ICD-10-CM

## 2013-09-11 DIAGNOSIS — O0990 Supervision of high risk pregnancy, unspecified, unspecified trimester: Secondary | ICD-10-CM

## 2013-09-11 DIAGNOSIS — O360990 Maternal care for other rhesus isoimmunization, unspecified trimester, not applicable or unspecified: Secondary | ICD-10-CM

## 2013-09-11 DIAGNOSIS — Z0371 Encounter for suspected problem with amniotic cavity and membrane ruled out: Secondary | ICD-10-CM

## 2013-09-11 DIAGNOSIS — O9989 Other specified diseases and conditions complicating pregnancy, childbirth and the puerperium: Principal | ICD-10-CM

## 2013-09-11 DIAGNOSIS — Z23 Encounter for immunization: Secondary | ICD-10-CM

## 2013-09-11 DIAGNOSIS — O98519 Other viral diseases complicating pregnancy, unspecified trimester: Secondary | ICD-10-CM

## 2013-09-11 DIAGNOSIS — R110 Nausea: Secondary | ICD-10-CM

## 2013-09-11 LAB — WET PREP, GENITAL
TRICH WET PREP: NONE SEEN
YEAST WET PREP: NONE SEEN

## 2013-09-11 LAB — URINALYSIS, ROUTINE W REFLEX MICROSCOPIC
Bilirubin Urine: NEGATIVE
GLUCOSE, UA: NEGATIVE mg/dL
Hgb urine dipstick: NEGATIVE
Ketones, ur: NEGATIVE mg/dL
LEUKOCYTES UA: NEGATIVE
Nitrite: NEGATIVE
PH: 6 (ref 5.0–8.0)
Protein, ur: NEGATIVE mg/dL
Specific Gravity, Urine: 1.025 (ref 1.005–1.030)
Urobilinogen, UA: 0.2 mg/dL (ref 0.0–1.0)

## 2013-09-11 NOTE — MAU Note (Addendum)
Patient presents with complaint of a gush of fluid at 1043 this morning.

## 2013-09-11 NOTE — Discharge Instructions (Signed)
Third Trimester of Pregnancy  The third trimester is from week 29 through week 42, months 7 through 9. The third trimester is a time when the fetus is growing rapidly. At the end of the ninth month, the fetus is about 20 inches in length and weighs 6 10 pounds.   BODY CHANGES  Your body goes through many changes during pregnancy. The changes vary from woman to woman.    Your weight will continue to increase. You can expect to gain 25 35 pounds (11 16 kg) by the end of the pregnancy.   You may begin to get stretch marks on your hips, abdomen, and breasts.   You may urinate more often because the fetus is moving lower into your pelvis and pressing on your bladder.   You may develop or continue to have heartburn as a result of your pregnancy.   You may develop constipation because certain hormones are causing the muscles that push waste through your intestines to slow down.   You may develop hemorrhoids or swollen, bulging veins (varicose veins).   You may have pelvic pain because of the weight gain and pregnancy hormones relaxing your joints between the bones in your pelvis. Back aches may result from over exertion of the muscles supporting your posture.   Your breasts will continue to grow and be tender. A yellow discharge may leak from your breasts called colostrum.   Your belly button may stick out.   You may feel short of breath because of your expanding uterus.   You may notice the fetus "dropping," or moving lower in your abdomen.   You may have a bloody mucus discharge. This usually occurs a few days to a week before labor begins.   Your cervix becomes thin and soft (effaced) near your due date.  WHAT TO EXPECT AT YOUR PRENATAL EXAMS   You will have prenatal exams every 2 weeks until week 36. Then, you will have weekly prenatal exams. During a routine prenatal visit:   You will be weighed to make sure you and the fetus are growing normally.   Your blood pressure is taken.   Your abdomen will be  measured to track your baby's growth.   The fetal heartbeat will be listened to.   Any test results from the previous visit will be discussed.   You may have a cervical check near your due date to see if you have effaced.  At around 36 weeks, your caregiver will check your cervix. At the same time, your caregiver will also perform a test on the secretions of the vaginal tissue. This test is to determine if a type of bacteria, Group B streptococcus, is present. Your caregiver will explain this further.  Your caregiver may ask you:   What your birth plan is.   How you are feeling.   If you are feeling the baby move.   If you have had any abnormal symptoms, such as leaking fluid, bleeding, severe headaches, or abdominal cramping.   If you have any questions.  Other tests or screenings that may be performed during your third trimester include:   Blood tests that check for low iron levels (anemia).   Fetal testing to check the health, activity level, and growth of the fetus. Testing is done if you have certain medical conditions or if there are problems during the pregnancy.  FALSE LABOR  You may feel small, irregular contractions that eventually go away. These are called Braxton Hicks contractions, or   false labor. Contractions may last for hours, days, or even weeks before true labor sets in. If contractions come at regular intervals, intensify, or become painful, it is best to be seen by your caregiver.   SIGNS OF LABOR    Menstrual-like cramps.   Contractions that are 5 minutes apart or less.   Contractions that start on the top of the uterus and spread down to the lower abdomen and back.   A sense of increased pelvic pressure or back pain.   A watery or bloody mucus discharge that comes from the vagina.  If you have any of these signs before the 37th week of pregnancy, call your caregiver right away. You need to go to the hospital to get checked immediately.  HOME CARE INSTRUCTIONS    Avoid all  smoking, herbs, alcohol, and unprescribed drugs. These chemicals affect the formation and growth of the baby.   Follow your caregiver's instructions regarding medicine use. There are medicines that are either safe or unsafe to take during pregnancy.   Exercise only as directed by your caregiver. Experiencing uterine cramps is a good sign to stop exercising.   Continue to eat regular, healthy meals.   Wear a good support bra for breast tenderness.   Do not use hot tubs, steam rooms, or saunas.   Wear your seat belt at all times when driving.   Avoid raw meat, uncooked cheese, cat litter boxes, and soil used by cats. These carry germs that can cause birth defects in the baby.   Take your prenatal vitamins.   Try taking a stool softener (if your caregiver approves) if you develop constipation. Eat more high-fiber foods, such as fresh vegetables or fruit and whole grains. Drink plenty of fluids to keep your urine clear or pale yellow.   Take warm sitz baths to soothe any pain or discomfort caused by hemorrhoids. Use hemorrhoid cream if your caregiver approves.   If you develop varicose veins, wear support hose. Elevate your feet for 15 minutes, 3 4 times a day. Limit salt in your diet.   Avoid heavy lifting, wear low heal shoes, and practice good posture.   Rest a lot with your legs elevated if you have leg cramps or low back pain.   Visit your dentist if you have not gone during your pregnancy. Use a soft toothbrush to brush your teeth and be gentle when you floss.   A sexual relationship may be continued unless your caregiver directs you otherwise.   Do not travel far distances unless it is absolutely necessary and only with the approval of your caregiver.   Take prenatal classes to understand, practice, and ask questions about the labor and delivery.   Make a trial run to the hospital.   Pack your hospital bag.   Prepare the baby's nursery.   Continue to go to all your prenatal visits as directed  by your caregiver.  SEEK MEDICAL CARE IF:   You are unsure if you are in labor or if your water has broken.   You have dizziness.   You have mild pelvic cramps, pelvic pressure, or nagging pain in your abdominal area.   You have persistent nausea, vomiting, or diarrhea.   You have a bad smelling vaginal discharge.   You have pain with urination.  SEEK IMMEDIATE MEDICAL CARE IF:    You have a fever.   You are leaking fluid from your vagina.   You have spotting or bleeding from your vagina.     You have severe abdominal cramping or pain.   You have rapid weight loss or gain.   You have shortness of breath with chest pain.   You notice sudden or extreme swelling of your face, hands, ankles, feet, or legs.   You have not felt your baby move in over an hour.   You have severe headaches that do not go away with medicine.   You have vision changes.  Document Released: 05/20/2001 Document Revised: 01/26/2013 Document Reviewed: 07/27/2012  [employer] Patient Information 2014 [employer].

## 2013-09-11 NOTE — MAU Provider Note (Signed)
History     CSN: [id number]  Arrival date and time: 09/11/13 1210   First Provider Initiated Contact with Patient 09/11/13 1242      Chief Complaint  Patient presents with  . Rupture of Membranes   HPI  Cheyenne Walton is a 23 y.o. G4P0030 at 3562w1d who presents today after a gush of fluid. She states that she had a gush of "fluid" at 1045. She has not had any since. She denies any bleeding, pain or contractions. She confirms fetal movement. She has an appointment in the clinic on 09/14/13   Past Medical History  Diagnosis Date  . UTI (urinary tract infection)   . Ectopic pregnancy   . Asthma     "haven't used an inhaler in years"    Past Surgical History  Procedure Laterality Date  . Laparoscopy  09/13/2011    Procedure: LAPAROSCOPY OPERATIVE;  Surgeon: Tereso NewcomerUgonna A Anyanwu, MD;  Location: [facility];  Service: Gynecology;  Laterality: Left;  operative laparoscopy left salpingectomy with removal of ectopic pregnancy.  . Ectopic pregnancy surgery    . Laparoscopic unilateral salpingectomy      LEFT SIDE with ectopic pregnancy    Family History  Problem Relation Age of Onset  . Anesthesia problems Neg Hx   . Hypotension Neg Hx   . Malignant hyperthermia Neg Hx   . Pseudochol deficiency Neg Hx   . Cancer Maternal Grandmother     History  Substance Use Topics  . Smoking status: Former Smoker -- 1.00 packs/day    Quit date: 04/08/2012  . Smokeless tobacco: Never Used  . Alcohol Use: No    Allergies:  Allergies  Allergen Reactions  . Contrast Media [Iodinated Diagnostic Agents] Anaphylaxis  . Other Anaphylaxis, Rash and Other (See Comments)    Seafood - throat swelling  . Latex Hives    Prescriptions prior to admission  Medication Sig Dispense Refill  . cyclobenzaprine (FLEXERIL) 5 MG tablet Take 1 tablet (5 mg total) by mouth 3 (three) times daily as needed for muscle spasms.  30 tablet  0  . folic acid (FOLVITE) 1 MG tablet Take 1 tablet (1 mg total) by mouth daily.  30  tablet  5  . Prenatal Vit-Fe Fumarate-FA (PRENATAL VITAMINS) 28-0.8 MG TABS Take 1 tablet by mouth daily.  30 tablet  2  . valACYclovir (VALTREX) 500 MG tablet Take two tablets by mouth twice daily for ten days; then one tablet twice daily for remainder of pregnancy  180 tablet  2    ROS Physical Exam   Blood pressure 116/69, pulse 92, temperature 98 F (36.7 C), temperature source Oral, resp. rate 18, height 5\' 4"  (1.626 m), weight 106.652 kg (235 lb 2 oz), last menstrual period 01/08/2013.  Physical Exam  Nursing note and vitals reviewed. Constitutional: She is oriented to person, place, and time. She appears well-developed and well-nourished. No distress.  Cardiovascular: Normal rate.   Respiratory: Effort normal.  GI: Soft. There is no tenderness.  Genitourinary:   External: no lesion Vagina: small amount of frothy yellow discharge. No pooling Cervix: pink, smooth, no fluid seen with valsalva. Closed/thick/-1 Uterus: AGA    Neurological: She is alert and oriented to person, place, and time.  Skin: Skin is warm and dry.  Psychiatric: She has a normal mood and affect.   FHT 140, moderate with 15x15 accels, no decels. Cat I Toco: no UCs   MAU Course  Procedures  Fern: negative   Results for orders placed during  the hospital encounter of 09/11/13 (from the past 24 hour(s))  URINALYSIS, ROUTINE W REFLEX MICROSCOPIC     Status: None   Collection Time    09/11/13 12:26 PM      Result Value Ref Range   Color, Urine YELLOW  YELLOW   APPearance CLEAR  CLEAR   Specific Gravity, Urine 1.025  1.005 - 1.030   pH 6.0  5.0 - 8.0   Glucose, UA NEGATIVE  NEGATIVE mg/dL   Hgb urine dipstick NEGATIVE  NEGATIVE   Bilirubin Urine NEGATIVE  NEGATIVE   Ketones, ur NEGATIVE  NEGATIVE mg/dL   Protein, ur NEGATIVE  NEGATIVE mg/dL   Urobilinogen, UA 0.2  0.0 - 1.0 mg/dL   Nitrite NEGATIVE  NEGATIVE   Leukocytes, UA NEGATIVE  NEGATIVE  WET PREP, GENITAL     Status: Abnormal   Collection  Time    09/11/13 12:46 PM      Result Value Ref Range   Yeast Wet Prep HPF POC NONE SEEN  NONE SEEN   Trich, Wet Prep NONE SEEN  NONE SEEN   Clue Cells Wet Prep HPF POC FEW (*) NONE SEEN   WBC, Wet Prep HPF POC MODERATE (*) NONE SEEN     Assessment and Plan   1. Encounter for suspected premature rupture of membranes, with rupture of membranes not found    PTL precautions Fetal kick counts Return to MAU as needed  Follow-up Information   Follow up with [facility]. (As scheduled)    Specialty:  Obstetrics and Gynecology   Contact information:   [address] [place] [address] [phone]       Tawnya Crook 09/11/2013, 12:53 PM

## 2013-09-13 NOTE — MAU Provider Note (Signed)
`````  Attestation of Attending Supervision of Advanced Practitioner: Evaluation and management procedures were performed by the PA/NP/CNM/OB Fellow under my supervision/collaboration. Chart reviewed and agree with management and plan.  Quoc Tome V 09/13/2013 11:07 PM

## 2013-09-14 ENCOUNTER — Ambulatory Visit (INDEPENDENT_AMBULATORY_CARE_PROVIDER_SITE_OTHER): Payer: Medicaid Other | Admitting: Advanced Practice Midwife

## 2013-09-14 VITALS — BP 116/73 | Temp 97.6°F | Wt 238.7 lb

## 2013-09-14 DIAGNOSIS — O98312 Other infections with a predominantly sexual mode of transmission complicating pregnancy, second trimester: Principal | ICD-10-CM

## 2013-09-14 DIAGNOSIS — O98519 Other viral diseases complicating pregnancy, unspecified trimester: Secondary | ICD-10-CM

## 2013-09-14 DIAGNOSIS — A6000 Herpesviral infection of urogenital system, unspecified: Secondary | ICD-10-CM

## 2013-09-14 DIAGNOSIS — A6009 Herpesviral infection of other urogenital tract: Secondary | ICD-10-CM

## 2013-09-14 LAB — POCT URINALYSIS DIP (DEVICE)
Bilirubin Urine: NEGATIVE
GLUCOSE, UA: NEGATIVE mg/dL
Hgb urine dipstick: NEGATIVE
Ketones, ur: NEGATIVE mg/dL
LEUKOCYTES UA: NEGATIVE
NITRITE: NEGATIVE
PROTEIN: NEGATIVE mg/dL
Specific Gravity, Urine: 1.025 (ref 1.005–1.030)
UROBILINOGEN UA: 0.2 mg/dL (ref 0.0–1.0)
pH: 6 (ref 5.0–8.0)

## 2013-09-14 NOTE — Progress Notes (Signed)
Pulse- 91 Patient reports some pelvic pressure

## 2013-09-14 NOTE — Progress Notes (Signed)
Doing well.  Good fetal movement, denies vaginal bleeding, LOF, regular contractions.  Reports some intermittent pelvic pressure, especially when standing.  Some constipation also.  Discussed high fiber diet, position changes to improve pelvic pressure.  PTL precautions given.

## 2013-09-19 ENCOUNTER — Inpatient Hospital Stay (HOSPITAL_COMMUNITY)
Admission: AD | Admit: 2013-09-19 | Discharge: 2013-09-20 | Disposition: A | Discharge status: Home / Self Care | Payer: Medicaid Other | Source: Ambulatory Visit | Attending: Obstetrics & Gynecology | Admitting: Obstetrics & Gynecology

## 2013-09-19 DIAGNOSIS — O99891 Other specified diseases and conditions complicating pregnancy: Secondary | ICD-10-CM | POA: Insufficient documentation

## 2013-09-19 DIAGNOSIS — N949 Unspecified condition associated with female genital organs and menstrual cycle: Secondary | ICD-10-CM | POA: Insufficient documentation

## 2013-09-19 DIAGNOSIS — J45909 Unspecified asthma, uncomplicated: Secondary | ICD-10-CM | POA: Insufficient documentation

## 2013-09-19 DIAGNOSIS — B9689 Other specified bacterial agents as the cause of diseases classified elsewhere: Secondary | ICD-10-CM

## 2013-09-19 DIAGNOSIS — N760 Acute vaginitis: Secondary | ICD-10-CM

## 2013-09-19 DIAGNOSIS — O26899 Other specified pregnancy related conditions, unspecified trimester: Secondary | ICD-10-CM

## 2013-09-19 DIAGNOSIS — R102 Pelvic and perineal pain: Secondary | ICD-10-CM

## 2013-09-19 DIAGNOSIS — R109 Unspecified abdominal pain: Secondary | ICD-10-CM | POA: Insufficient documentation

## 2013-09-19 DIAGNOSIS — Z87891 Personal history of nicotine dependence: Secondary | ICD-10-CM | POA: Insufficient documentation

## 2013-09-19 DIAGNOSIS — O9989 Other specified diseases and conditions complicating pregnancy, childbirth and the puerperium: Principal | ICD-10-CM

## 2013-09-19 NOTE — MAU Note (Signed)
Pt G4 P0 at 36.2wks having cramping and sharp abd pain that comes and goes since 2040.  Denies bleeding.

## 2013-09-20 ENCOUNTER — Encounter (HOSPITAL_COMMUNITY): Payer: Self-pay | Admitting: *Deleted

## 2013-09-20 DIAGNOSIS — O26899 Other specified pregnancy related conditions, unspecified trimester: Secondary | ICD-10-CM

## 2013-09-20 DIAGNOSIS — N949 Unspecified condition associated with female genital organs and menstrual cycle: Secondary | ICD-10-CM

## 2013-09-20 LAB — URINALYSIS, ROUTINE W REFLEX MICROSCOPIC
Bilirubin Urine: NEGATIVE
Glucose, UA: NEGATIVE mg/dL
Hgb urine dipstick: NEGATIVE
Ketones, ur: NEGATIVE mg/dL
NITRITE: NEGATIVE
Protein, ur: NEGATIVE mg/dL
Specific Gravity, Urine: 1.025 (ref 1.005–1.030)
UROBILINOGEN UA: 0.2 mg/dL (ref 0.0–1.0)
pH: 6.5 (ref 5.0–8.0)

## 2013-09-20 LAB — URINE MICROSCOPIC-ADD ON

## 2013-09-20 LAB — GC/CHLAMYDIA PROBE AMP
CT Probe RNA: POSITIVE — AB
GC PROBE AMP APTIMA: NEGATIVE

## 2013-09-20 LAB — OB RESULTS CONSOLE GBS: STREP GROUP B AG: POSITIVE

## 2013-09-20 LAB — WET PREP, GENITAL
Trich, Wet Prep: NONE SEEN
YEAST WET PREP: NONE SEEN

## 2013-09-20 LAB — POCT FERN TEST: POCT Fern Test: NEGATIVE

## 2013-09-20 MED ORDER — METRONIDAZOLE 500 MG PO TABS: 500.0000 mg | ORAL_TABLET | Freq: Two times a day (BID) | ORAL | Status: DC

## 2013-09-20 NOTE — MAU Provider Note (Signed)
Chief Complaint:  Abdominal Cramping  @MAUPATCONTACT @  HPI: Cheyenne Walton is a 23 y.o. G4P0030 at [redacted]w[redacted]d who presents to maternity admissions reporting cramping and sharp pains started around 8:45pm this evening, described first onset of lower pelvic cramping while resting at home, tried alternating from side to side, sitting up and laying back, without any relief from any positional change. Cramping was followed by intermittent sharp central abdominal pains. Admits cramping initially only lasted few minutes, now it is currently constant without relief. Has not tried any Tylenol, but did take Flexeril tonight (without relief).  Denies contractions, leakage of fluid or vaginal bleeding. Good fetal movement.   Of note: Recent MAU course with similar "cramping pain, but less severe" and concern of SROM (09/11/13), determined to have no evidence of SROM with negative fern, no pooling. No evidence of pre-term labor with closed cervix, no UCs.  Pregnancy Course: PNC at [facility] (hx prior ectopic s/p L-salpingectomy, x 2 SAB ) since 3 weeks. 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    Past Medical History: Past Medical History  Diagnosis Date  . UTI (urinary tract infection)   . Ectopic pregnancy   . Asthma     "haven't used an inhaler in years"    Past obstetric history: OB History  Gravida Para Term Preterm AB SAB TAB Ectopic Multiple Living  4 0 0 0 3 2 0 1 0 0     # Outcome Date GA Lbr Len/2nd Weight Sex Delivery Anes PTL Lv  4 CUR           3 SAB 12/29/11          2 ECT 09/13/11             Comments: MTX with no result > left salpingectomy  1 SAB 2013             Comments: System Generated. Please review and update pregnancy details.      Past Surgical History: Past Surgical History  Procedure Laterality Date  . Laparoscopy  09/13/2011    Procedure: LAPAROSCOPY OPERATIVE;   Surgeon: Tereso Newcomer, MD;  Location: [facility];  Service: Gynecology;  Laterality: Left;  operative laparoscopy left salpingectomy with removal of ectopic pregnancy.  . Ectopic pregnancy surgery    . Laparoscopic unilateral salpingectomy      LEFT SIDE with ectopic pregnancy    Family History: Family History  Problem Relation Age of Onset  . Anesthesia problems Neg Hx   . Hypotension Neg Hx   . Malignant hyperthermia Neg Hx   . Pseudochol deficiency Neg Hx   . Cancer Maternal Grandmother     Social History: History  Substance Use Topics  . Smoking status: Former Smoker -- 1.00 packs/day    Quit date: 04/08/2012  . Smokeless tobacco: Never Used  . Alcohol Use: No    Allergies:  Allergies  Allergen Reactions  . Contrast Media [Iodinated Diagnostic Agents] Anaphylaxis  . Other Anaphylaxis, Rash and Other (See Comments)    Seafood - throat swelling  . Latex Hives    Meds:  Prescriptions prior to admission  Medication Sig Dispense Refill  . folic acid (FOLVITE) 1 MG tablet Take 1 tablet (1 mg total) by mouth daily.  30 tablet  5  . Prenatal Vit-Fe Fumarate-FA (PRENATAL VITAMINS) 28-0.8 MG TABS Take 1 tablet by mouth daily.  30 tablet  2  . valACYclovir (VALTREX) 500 MG  tablet Take two tablets by mouth twice daily for ten days; then one tablet twice daily for remainder of pregnancy  180 tablet  2    ROS: Pertinent findings in history of present illness.  Physical Exam  Blood pressure 133/58, pulse 100, temperature 98.2 F (36.8 C), temperature source Oral, resp. rate 20, height 5\' 4"  (1.626 m), weight 108.228 kg (238 lb 9.6 oz), last menstrual period 01/08/2013. GENERAL: well-appearing, cooperative, comfortable, NAD HEENT: MMM HEART: RRR, no murmurs RESP: CTAB ABDOMEN: Soft, mild bilateral TTP, gravid appropriate for gestational age, without palpable contractions or tightening EXTREMITIES: non-tender, mild L>R bilateral LE edema ankles to mid-calf, without erythema,  intact peripheral pulses NEURO: alert and oriented Pelvic Exam: Normal external genitalia, visualization of cervix without bleeding or lesions. No pooling on exam. Thick white discharge appears to be physiologic. Cervical Exam: Closed / Thick / posterior (Karamalegos / KMW) Dilation: Closed Effacement (%): Thick Station: -2 Presentation: Compound Exam by:: K.Wilson,RN  FHT:  Baseline 160 bpm, moderate variability, accelerations present, no decelerations Contractions: No UC   Labs: Results for orders placed during the hospital encounter of 09/19/13 (from the past 24 hour(s))  URINALYSIS, ROUTINE W REFLEX MICROSCOPIC     Status: Abnormal   Collection Time    09/19/13 11:55 PM      Result Value Ref Range   Color, Urine YELLOW  YELLOW   APPearance CLEAR  CLEAR   Specific Gravity, Urine 1.025  1.005 - 1.030   pH 6.5  5.0 - 8.0   Glucose, UA NEGATIVE  NEGATIVE mg/dL   Hgb urine dipstick NEGATIVE  NEGATIVE   Bilirubin Urine NEGATIVE  NEGATIVE   Ketones, ur NEGATIVE  NEGATIVE mg/dL   Protein, ur NEGATIVE  NEGATIVE mg/dL   Urobilinogen, UA 0.2  0.0 - 1.0 mg/dL   Nitrite NEGATIVE  NEGATIVE   Leukocytes, UA TRACE (*) NEGATIVE  URINE MICROSCOPIC-ADD ON     Status: Abnormal   Collection Time    09/19/13 11:55 PM      Result Value Ref Range   Squamous Epithelial / LPF RARE  RARE   WBC, UA 0-2  <3 WBC/hpf   RBC / HPF 0-2  <3 RBC/hpf   Bacteria, UA FEW (*) RARE    Imaging:  No results found.  MAU Course: NST  Assessment: 1. Pelvic pain in pregnancy    Cheyenne Walton is a 23 y.o. G4P0030 at 4470w3d who presented with bilateral lower pelvic cramping pain, not consistent with regular contractions. No history suggestive of SROM. FHT reactive and reassuring. Comfortable appearing, no evidence of UC by palpation or on toco. Pelvic with physiologic discharge, otherwise normal and closed thick cervix. Suspect pelvic pain in pregnancy.   Plan: - FHT, reactive and reassuring - POCT Fern  test neg - UA (negative) - Wet mount + for clue cells - Will tx with flagyl 500mg  bid x7d - GC/Chlam probe, GBS culture - Continue increased hydration at home to avoid dehydration / false labor - Discharge to home, continue with previously scheduled PNC f/u within 1 week - Labor precautions and fetal kick counts    Medication List    ASK your doctor about these medications       folic acid 1 MG tablet  Commonly known as:  FOLVITE  Take 1 tablet (1 mg total) by mouth daily.     Prenatal Vitamins 28-0.8 MG Tabs  Take 1 tablet by mouth daily.     valACYclovir 500 MG tablet  Commonly known  as:  VALTREX  Take two tablets by mouth twice daily for ten days; then one tablet twice daily for remainder of pregnancy        [name], DO [facility], PGY-1  09/20/2013 1:34 AM  I spoke with and examined patient and agree with resident's note and plan of care.  Tawana ScaleMichael Ryan Zayna Toste, MD OB Fellow 09/20/2013 1:49 AM

## 2013-09-20 NOTE — Discharge Instructions (Signed)
Third Trimester of Pregnancy °The third trimester is from week 29 through week 42, months 7 through 9. The third trimester is a time when the fetus is growing rapidly. At the end of the ninth month, the fetus is about 20 inches in length and weighs 6 10 pounds.  °BODY CHANGES °Your body goes through many changes during pregnancy. The changes vary from woman to woman.  °· Your weight will continue to increase. You can expect to gain 25 35 pounds (11 16 kg) by the end of the pregnancy. °· You may begin to get stretch marks on your hips, abdomen, and breasts. °· You may urinate more often because the fetus is moving lower into your pelvis and pressing on your bladder. °· You may develop or continue to have heartburn as a result of your pregnancy. °· You may develop constipation because certain hormones are causing the muscles that push waste through your intestines to slow down. °· You may develop hemorrhoids or swollen, bulging veins (varicose veins). °· You may have pelvic pain because of the weight gain and pregnancy hormones relaxing your joints between the bones in your pelvis. Back aches may result from over exertion of the muscles supporting your posture. °· Your breasts will continue to grow and be tender. A yellow discharge may leak from your breasts called colostrum. °· Your belly button may stick out. °· You may feel short of breath because of your expanding uterus. °· You may notice the fetus "dropping," or moving lower in your abdomen. °· You may have a bloody mucus discharge. This usually occurs a few days to a week before labor begins. °· Your cervix becomes thin and soft (effaced) near your due date. °WHAT TO EXPECT AT YOUR PRENATAL EXAMS  °You will have prenatal exams every 2 weeks until week 36. Then, you will have weekly prenatal exams. During a routine prenatal visit: °· You will be weighed to make sure you and the fetus are growing normally. °· Your blood pressure is taken. °· Your abdomen will be  measured to track your baby's growth. °· The fetal heartbeat will be listened to. °· Any test results from the previous visit will be discussed. °· You may have a cervical check near your due date to see if you have effaced. °At around 36 weeks, your caregiver will check your cervix. At the same time, your caregiver will also perform a test on the secretions of the vaginal tissue. This test is to determine if a type of bacteria, Group B streptococcus, is present. Your caregiver will explain this further. °Your caregiver may ask you: °· What your birth plan is. °· How you are feeling. °· If you are feeling the baby move. °· If you have had any abnormal symptoms, such as leaking fluid, bleeding, severe headaches, or abdominal cramping. °· If you have any questions. °Other tests or screenings that may be performed during your third trimester include: °· Blood tests that check for low iron levels (anemia). °· Fetal testing to check the health, activity level, and growth of the fetus. Testing is done if you have certain medical conditions or if there are problems during the pregnancy. °FALSE LABOR °You may feel small, irregular contractions that eventually go away. These are called Braxton Hicks contractions, or false labor. Contractions may last for hours, days, or even weeks before true labor sets in. If contractions come at regular intervals, intensify, or become painful, it is best to be seen by your caregiver.  °  SIGNS OF LABOR  °· Menstrual-like cramps. °· Contractions that are 5 minutes apart or less. °· Contractions that start on the top of the uterus and spread down to the lower abdomen and back. °· A sense of increased pelvic pressure or back pain. °· A watery or bloody mucus discharge that comes from the vagina. °If you have any of these signs before the 37th week of pregnancy, call your caregiver right away. You need to go to the hospital to get checked immediately. °HOME CARE INSTRUCTIONS  °· Avoid all  smoking, herbs, alcohol, and unprescribed drugs. These chemicals affect the formation and growth of the baby. °· Follow your caregiver's instructions regarding medicine use. There are medicines that are either safe or unsafe to take during pregnancy. °· Exercise only as directed by your caregiver. Experiencing uterine cramps is a good sign to stop exercising. °· Continue to eat regular, healthy meals. °· Wear a good support bra for breast tenderness. °· Do not use hot tubs, steam rooms, or saunas. °· Wear your seat belt at all times when driving. °· Avoid raw meat, uncooked cheese, cat litter boxes, and soil used by cats. These carry germs that can cause birth defects in the baby. °· Take your prenatal vitamins. °· Try taking a stool softener (if your caregiver approves) if you develop constipation. Eat more high-fiber foods, such as fresh vegetables or fruit and whole grains. Drink plenty of fluids to keep your urine clear or pale yellow. °· Take warm sitz baths to soothe any pain or discomfort caused by hemorrhoids. Use hemorrhoid cream if your caregiver approves. °· If you develop varicose veins, wear support hose. Elevate your feet for 15 minutes, 3 4 times a day. Limit salt in your diet. °· Avoid heavy lifting, wear low heal shoes, and practice good posture. °· Rest a lot with your legs elevated if you have leg cramps or low back pain. °· Visit your dentist if you have not gone during your pregnancy. Use a soft toothbrush to brush your teeth and be gentle when you floss. °· A sexual relationship may be continued unless your caregiver directs you otherwise. °· Do not travel far distances unless it is absolutely necessary and only with the approval of your caregiver. °· Take prenatal classes to understand, practice, and ask questions about the labor and delivery. °· Make a trial run to the hospital. °· Pack your hospital bag. °· Prepare the baby's nursery. °· Continue to go to all your prenatal visits as directed  by your caregiver. °SEEK MEDICAL CARE IF: °· You are unsure if you are in labor or if your water has broken. °· You have dizziness. °· You have mild pelvic cramps, pelvic pressure, or nagging pain in your abdominal area. °· You have persistent nausea, vomiting, or diarrhea. °· You have a bad smelling vaginal discharge. °· You have pain with urination. °SEEK IMMEDIATE MEDICAL CARE IF:  °· You have a fever. °· You are leaking fluid from your vagina. °· You have spotting or bleeding from your vagina. °· You have severe abdominal cramping or pain. °· You have rapid weight loss or gain. °· You have shortness of breath with chest pain. °· You notice sudden or extreme swelling of your face, hands, ankles, feet, or legs. °· You have not felt your baby move in over an hour. °· You have severe headaches that do not go away with medicine. °· You have vision changes. °Document Released: 05/20/2001 Document Revised: 01/26/2013 Document Reviewed:   07/27/2012 ExitCare Patient Information 2014 [place].   Braxton Hicks Contractions Pregnancy is commonly associated with contractions of the uterus throughout the pregnancy. Towards the end of pregnancy (32 to 34 weeks), these contractions [facility](Braxton Willa [facility]) can develop more often and may become more forceful. This is not true labor because these contractions do not result in opening (dilatation) and thinning of the cervix. They are sometimes difficult to tell apart from true labor because these contractions can be forceful and people have different pain tolerances. You should not feel embarrassed if you go to the hospital with false labor. Sometimes, the only way to tell if you are in true labor is for your caregiver to follow the changes in the cervix. How to tell the difference between true and false labor:  False labor.  The contractions of false labor are usually shorter, irregular and not as hard as those of true labor.  They are often felt in the front of the  lower abdomen and in the groin.  They may leave with walking around or changing positions while lying down.  They get weaker and are shorter lasting as time goes on.  These contractions are usually irregular.  They do not usually become progressively stronger, regular and closer together as with true labor.  True labor.  Contractions in true labor last 30 to 70 seconds, become very regular, usually become more intense, and increase in frequency.  They do not go away with walking.  The discomfort is usually felt in the top of the uterus and spreads to the lower abdomen and low back.  True labor can be determined by your caregiver with an exam. This will show that the cervix is dilating and getting thinner. If there are no prenatal problems or other health problems associated with the pregnancy, it is completely safe to be sent home with false labor and await the onset of true labor. HOME CARE INSTRUCTIONS   Keep up with your usual exercises and instructions.  Take medications as directed.  Keep your regular prenatal appointment.  Eat and drink lightly if you think you are going into labor.  If BH contractions are making you uncomfortable:  Change your activity position from lying down or resting to walking/walking to resting.  Sit and rest in a tub of warm water.  Drink 2 to 3 glasses of water. Dehydration may cause B-H contractions.  Do slow and deep breathing several times an hour. SEEK IMMEDIATE MEDICAL CARE IF:   Your contractions continue to become stronger, more regular, and closer together.  You have a gushing, burst or leaking of fluid from the vagina.  An oral temperature above 102 F (38.9 C) develops.  You have passage of blood-tinged mucus.  You develop vaginal bleeding.  You develop continuous belly (abdominal) pain.  You have low back pain that you never had before.  You feel the baby's head pushing down causing pelvic pressure.  The baby is not  moving as much as it used to. Document Released: 05/26/2005 Document Revised: 08/18/2011 Document Reviewed: 03/07/2013 [employer] Patient Information 2014 [place], [place].  Fetal Movement Counts Patient Name: __________________________________________________ Patient Due Date: ____________________ Performing a fetal movement count is highly recommended in high-risk pregnancies, but it is good for every pregnant woman to do. Your caregiver may ask you to start counting fetal movements at 28 weeks of the pregnancy. Fetal movements often increase:  After eating a full meal.  After physical activity.  After eating or drinking something sweet or  cold.  At rest. Pay attention to when you feel the baby is most active. This will help you notice a pattern of your baby's sleep and wake cycles and what factors contribute to an increase in fetal movement. It is important to perform a fetal movement count at the same time each day when your baby is normally most active.  HOW TO COUNT FETAL MOVEMENTS 1. Find a quiet and comfortable area to sit or lie down on your left side. Lying on your left side provides the best blood and oxygen circulation to your baby. 2. Write down the day and time on a sheet of paper or in a journal. 3. Start counting kicks, flutters, swishes, rolls, or jabs in a 2 hour period. You should feel at least 10 movements within 2 hours. 4. If you do not feel 10 movements in 2 hours, wait 2 3 hours and count again. Look for a change in the pattern or not enough counts in 2 hours. SEEK MEDICAL CARE IF:  You feel less than 10 counts in 2 hours, tried twice.  There is no movement in over an hour.  The pattern is changing or taking longer each day to reach 10 counts in 2 hours.  You feel the baby is not moving as he or she usually does. Date: ____________ Movements: ____________ Start time: ____________ Doreatha MartinFinish time: ____________  Date: ____________ Movements: ____________ Start time:  ____________ Doreatha MartinFinish time: ____________ Date: ____________ Movements: ____________ Start time: ____________ Doreatha MartinFinish time: ____________ Date: ____________ Movements: ____________ Start time: ____________ Doreatha MartinFinish time: ____________ Date: ____________ Movements: ____________ Start time: ____________ Doreatha MartinFinish time: ____________ Date: ____________ Movements: ____________ Start time: ____________ Doreatha MartinFinish time: ____________ Date: ____________ Movements: ____________ Start time: ____________ Doreatha MartinFinish time: ____________ Date: ____________ Movements: ____________ Start time: ____________ Doreatha MartinFinish time: ____________  Date: ____________ Movements: ____________ Start time: ____________ Doreatha MartinFinish time: ____________ Date: ____________ Movements: ____________ Start time: ____________ Doreatha MartinFinish time: ____________ Date: ____________ Movements: ____________ Start time: ____________ Doreatha MartinFinish time: ____________ Date: ____________ Movements: ____________ Start time: ____________ Doreatha MartinFinish time: ____________ Date: ____________ Movements: ____________ Start time: ____________ Doreatha MartinFinish time: ____________ Date: ____________ Movements: ____________ Start time: ____________ Doreatha MartinFinish time: ____________ Date: ____________ Movements: ____________ Start time: ____________ Doreatha MartinFinish time: ____________  Date: ____________ Movements: ____________ Start time: ____________ Doreatha MartinFinish time: ____________ Date: ____________ Movements: ____________ Start time: ____________ Doreatha MartinFinish time: ____________ Date: ____________ Movements: ____________ Start time: ____________ Doreatha MartinFinish time: ____________ Date: ____________ Movements: ____________ Start time: ____________ Doreatha MartinFinish time: ____________ Date: ____________ Movements: ____________ Start time: ____________ Doreatha MartinFinish time: ____________ Date: ____________ Movements: ____________ Start time: ____________ Doreatha MartinFinish time: ____________ Date: ____________ Movements: ____________ Start time: ____________ Doreatha MartinFinish time: ____________  Date:  ____________ Movements: ____________ Start time: ____________ Doreatha MartinFinish time: ____________ Date: ____________ Movements: ____________ Start time: ____________ Doreatha MartinFinish time: ____________ Date: ____________ Movements: ____________ Start time: ____________ Doreatha MartinFinish time: ____________ Date: ____________ Movements: ____________ Start time: ____________ Doreatha MartinFinish time: ____________ Date: ____________ Movements: ____________ Start time: ____________ Doreatha MartinFinish time: ____________ Date: ____________ Movements: ____________ Start time: ____________ Doreatha MartinFinish time: ____________ Date: ____________ Movements: ____________ Start time: ____________ Doreatha MartinFinish time: ___________ Document Released: 06/25/2006 Document Revised: 05/12/2012 Document Reviewed: 03/22/2012 ExitCare Patient Information 2014 [employer] [place] [place].

## 2013-09-21 ENCOUNTER — Telehealth: Payer: Self-pay

## 2013-09-21 DIAGNOSIS — A749 Chlamydial infection, unspecified: Secondary | ICD-10-CM

## 2013-09-21 DIAGNOSIS — O98812 Other maternal infectious and parasitic diseases complicating pregnancy, second trimester: Principal | ICD-10-CM

## 2013-09-21 LAB — CULTURE, BETA STREP (GROUP B ONLY)

## 2013-09-21 MED ORDER — AZITHROMYCIN 500 MG PO TABS: 1000.0000 mg | ORAL_TABLET | Freq: Every day | ORAL | Status: DC

## 2013-09-21 NOTE — Telephone Encounter (Signed)
Zithromax 1gm prescribed. Attempted to call pt. Mom answered and stated she is not in but that she will have pt. Call us. Pt. Needs to be informed of +chlamydia and treatment prescribed.

## 2013-09-21 NOTE — Telephone Encounter (Signed)
Message copied by Louanna RawAMPBELL, Johngabriel Verde M on Wed Sep 21, 2013  1:21 PM ------      Message from: Pennie BanterSMITH, MARNI W      Created: Wed Sep 21, 2013  9:48 AM       Telephone call to patient regarding positive chlamydia culture, patient not in, left message for her to call.  Patient has not been treated and will need Rx called in per protocol.  [facility] and does have appointment for 09/28/13. Report faxed to health department. ------

## 2013-09-22 NOTE — Telephone Encounter (Signed)
Called pt. No answer. Left message stating we are calling with results, it is important, please call clinic at earliest convenience.

## 2013-09-22 NOTE — Telephone Encounter (Signed)
Pt. Returned call. Informed her of results and of medication at her pharmacy. Advised her to take medication on a full stomach. Advised her to ensure her partner is treated and they abstain from sex until both are treated. Pt. Verbalized understanding. No questions or concerns.

## 2013-09-25 NOTE — MAU Provider Note (Signed)
Attestation of Attending Supervision of Fellow: Evaluation and management procedures were performed by the Fellow under my supervision and collaboration.  I have reviewed the Fellow's note and chart, and I agree with the management and plan.    

## 2013-09-28 ENCOUNTER — Encounter: Payer: Self-pay | Admitting: Family Medicine

## 2013-09-28 ENCOUNTER — Ambulatory Visit (INDEPENDENT_AMBULATORY_CARE_PROVIDER_SITE_OTHER): Payer: Medicaid Other | Admitting: Advanced Practice Midwife

## 2013-09-28 VITALS — BP 115/75 | HR 78 | Temp 98.8°F | Wt 238.0 lb

## 2013-09-28 DIAGNOSIS — O360990 Maternal care for other rhesus isoimmunization, unspecified trimester, not applicable or unspecified: Secondary | ICD-10-CM

## 2013-09-28 DIAGNOSIS — O0990 Supervision of high risk pregnancy, unspecified, unspecified trimester: Secondary | ICD-10-CM

## 2013-09-28 LAB — POCT URINALYSIS DIP (DEVICE)
Bilirubin Urine: NEGATIVE
Glucose, UA: NEGATIVE mg/dL
HGB URINE DIPSTICK: NEGATIVE
Ketones, ur: NEGATIVE mg/dL
Nitrite: NEGATIVE
PROTEIN: NEGATIVE mg/dL
Urobilinogen, UA: 0.2 mg/dL (ref 0.0–1.0)
pH: 6 (ref 5.0–8.0)

## 2013-09-28 NOTE — Patient Instructions (Signed)
Third Trimester of Pregnancy  The third trimester is from week 29 through week 42, months 7 through 9. The third trimester is a time when the fetus is growing rapidly. At the end of the ninth month, the fetus is about 20 inches in length and weighs 6 10 pounds.   BODY CHANGES  Your body goes through many changes during pregnancy. The changes vary from woman to woman.    Your weight will continue to increase. You can expect to gain 25 35 pounds (11 16 kg) by the end of the pregnancy.   You may begin to get stretch marks on your hips, abdomen, and breasts.   You may urinate more often because the fetus is moving lower into your pelvis and pressing on your bladder.   You may develop or continue to have heartburn as a result of your pregnancy.   You may develop constipation because certain hormones are causing the muscles that push waste through your intestines to slow down.   You may develop hemorrhoids or swollen, bulging veins (varicose veins).   You may have pelvic pain because of the weight gain and pregnancy hormones relaxing your joints between the bones in your pelvis. Back aches may result from over exertion of the muscles supporting your posture.   Your breasts will continue to grow and be tender. A yellow discharge may leak from your breasts called colostrum.   Your belly button may stick out.   You may feel short of breath because of your expanding uterus.   You may notice the fetus "dropping," or moving lower in your abdomen.   You may have a bloody mucus discharge. This usually occurs a few days to a week before labor begins.   Your cervix becomes thin and soft (effaced) near your due date.  WHAT TO EXPECT AT YOUR PRENATAL EXAMS   You will have prenatal exams every 2 weeks until week 36. Then, you will have weekly prenatal exams. During a routine prenatal visit:   You will be weighed to make sure you and the fetus are growing normally.   Your blood pressure is taken.   Your abdomen will be  measured to track your baby's growth.   The fetal heartbeat will be listened to.   Any test results from the previous visit will be discussed.   You may have a cervical check near your due date to see if you have effaced.  At around 36 weeks, your caregiver will check your cervix. At the same time, your caregiver will also perform a test on the secretions of the vaginal tissue. This test is to determine if a type of bacteria, Group B streptococcus, is present. Your caregiver will explain this further.  Your caregiver may ask you:   What your birth plan is.   How you are feeling.   If you are feeling the baby move.   If you have had any abnormal symptoms, such as leaking fluid, bleeding, severe headaches, or abdominal cramping.   If you have any questions.  Other tests or screenings that may be performed during your third trimester include:   Blood tests that check for low iron levels (anemia).   Fetal testing to check the health, activity level, and growth of the fetus. Testing is done if you have certain medical conditions or if there are problems during the pregnancy.  FALSE LABOR  You may feel small, irregular contractions that eventually go away. These are called Braxton Hicks contractions, or   false labor. Contractions may last for hours, days, or even weeks before true labor sets in. If contractions come at regular intervals, intensify, or become painful, it is best to be seen by your caregiver.   SIGNS OF LABOR    Menstrual-like cramps.   Contractions that are 5 minutes apart or less.   Contractions that start on the top of the uterus and spread down to the lower abdomen and back.   A sense of increased pelvic pressure or back pain.   A watery or bloody mucus discharge that comes from the vagina.  If you have any of these signs before the 37th week of pregnancy, call your caregiver right away. You need to go to the hospital to get checked immediately.  HOME CARE INSTRUCTIONS    Avoid all  smoking, herbs, alcohol, and unprescribed drugs. These chemicals affect the formation and growth of the baby.   Follow your caregiver's instructions regarding medicine use. There are medicines that are either safe or unsafe to take during pregnancy.   Exercise only as directed by your caregiver. Experiencing uterine cramps is a good sign to stop exercising.   Continue to eat regular, healthy meals.   Wear a good support bra for breast tenderness.   Do not use hot tubs, steam rooms, or saunas.   Wear your seat belt at all times when driving.   Avoid raw meat, uncooked cheese, cat litter boxes, and soil used by cats. These carry germs that can cause birth defects in the baby.   Take your prenatal vitamins.   Try taking a stool softener (if your caregiver approves) if you develop constipation. Eat more high-fiber foods, such as fresh vegetables or fruit and whole grains. Drink plenty of fluids to keep your urine clear or pale yellow.   Take warm sitz baths to soothe any pain or discomfort caused by hemorrhoids. Use hemorrhoid cream if your caregiver approves.   If you develop varicose veins, wear support hose. Elevate your feet for 15 minutes, 3 4 times a day. Limit salt in your diet.   Avoid heavy lifting, wear low heal shoes, and practice good posture.   Rest a lot with your legs elevated if you have leg cramps or low back pain.   Visit your dentist if you have not gone during your pregnancy. Use a soft toothbrush to brush your teeth and be gentle when you floss.   A sexual relationship may be continued unless your caregiver directs you otherwise.   Do not travel far distances unless it is absolutely necessary and only with the approval of your caregiver.   Take prenatal classes to understand, practice, and ask questions about the labor and delivery.   Make a trial run to the hospital.   Pack your hospital bag.   Prepare the baby's nursery.   Continue to go to all your prenatal visits as directed  by your caregiver.  SEEK MEDICAL CARE IF:   You are unsure if you are in labor or if your water has broken.   You have dizziness.   You have mild pelvic cramps, pelvic pressure, or nagging pain in your abdominal area.   You have persistent nausea, vomiting, or diarrhea.   You have a bad smelling vaginal discharge.   You have pain with urination.  SEEK IMMEDIATE MEDICAL CARE IF:    You have a fever.   You are leaking fluid from your vagina.   You have spotting or bleeding from your vagina.     You have severe abdominal cramping or pain.   You have rapid weight loss or gain.   You have shortness of breath with chest pain.   You notice sudden or extreme swelling of your face, hands, ankles, feet, or legs.   You have not felt your baby move in over an hour.   You have severe headaches that do not go away with medicine.   You have vision changes.  Document Released: 05/20/2001 Document Revised: 01/26/2013 Document Reviewed: 07/27/2012  [employer] Patient Information 2014 [employer].

## 2013-09-28 NOTE — Progress Notes (Signed)
Cheyenne Walton is a 23 y.o. G4P0030 at 4169w4d for routine follow up.  She reports continuous cramping, no bleeding but some discharge, no LOF, +FM.  See flow sheet for details.  A/P: Pregnancy at 1369w4d.  Doing well.   Pregnancy issues include HSV suppressive therapy, 2x episodes chlamydia (last treated 09/23/13).   Infant feeding choice: breast  Contraception choice: mirena GBS/GC/CZ results were reviewed today.  Positive chlamydia 09/20/13, treated Labor precautions reviewed. Kick counts reviewed.

## 2013-09-28 NOTE — Progress Notes (Signed)
Seen also by me.  Agree with note. 

## 2013-09-28 NOTE — Progress Notes (Signed)
C/o continous cramping in pelvic area.

## 2013-10-05 ENCOUNTER — Ambulatory Visit (INDEPENDENT_AMBULATORY_CARE_PROVIDER_SITE_OTHER): Payer: Medicaid Other | Admitting: Obstetrics and Gynecology

## 2013-10-05 VITALS — BP 105/65 | HR 85 | Temp 97.8°F | Wt 241.6 lb

## 2013-10-05 DIAGNOSIS — O09899 Supervision of other high risk pregnancies, unspecified trimester: Secondary | ICD-10-CM

## 2013-10-05 DIAGNOSIS — O360990 Maternal care for other rhesus isoimmunization, unspecified trimester, not applicable or unspecified: Secondary | ICD-10-CM

## 2013-10-05 DIAGNOSIS — O98819 Other maternal infectious and parasitic diseases complicating pregnancy, unspecified trimester: Secondary | ICD-10-CM

## 2013-10-05 DIAGNOSIS — Z6791 Unspecified blood type, Rh negative: Secondary | ICD-10-CM

## 2013-10-05 DIAGNOSIS — O99820 Streptococcus B carrier state complicating pregnancy: Secondary | ICD-10-CM

## 2013-10-05 DIAGNOSIS — Z22330 Carrier of Group B streptococcus: Secondary | ICD-10-CM

## 2013-10-05 DIAGNOSIS — O98812 Other maternal infectious and parasitic diseases complicating pregnancy, second trimester: Secondary | ICD-10-CM

## 2013-10-05 DIAGNOSIS — O0990 Supervision of high risk pregnancy, unspecified, unspecified trimester: Secondary | ICD-10-CM

## 2013-10-05 DIAGNOSIS — O99330 Smoking (tobacco) complicating pregnancy, unspecified trimester: Secondary | ICD-10-CM

## 2013-10-05 DIAGNOSIS — O26899 Other specified pregnancy related conditions, unspecified trimester: Secondary | ICD-10-CM

## 2013-10-05 DIAGNOSIS — O99332 Smoking (tobacco) complicating pregnancy, second trimester: Secondary | ICD-10-CM

## 2013-10-05 DIAGNOSIS — A749 Chlamydial infection, unspecified: Secondary | ICD-10-CM

## 2013-10-05 LAB — POCT URINALYSIS DIP (DEVICE)
Bilirubin Urine: NEGATIVE
Glucose, UA: NEGATIVE mg/dL
Hgb urine dipstick: NEGATIVE
LEUKOCYTES UA: NEGATIVE
Nitrite: NEGATIVE
Protein, ur: NEGATIVE mg/dL
SPECIFIC GRAVITY, URINE: 1.025 (ref 1.005–1.030)
Urobilinogen, UA: 0.2 mg/dL (ref 0.0–1.0)
pH: 6.5 (ref 5.0–8.0)

## 2013-10-05 NOTE — Progress Notes (Signed)
C/o of pelvic pressure and irregular contractions.

## 2013-10-05 NOTE — Patient Instructions (Signed)
Third Trimester of Pregnancy  The third trimester is from week 29 through week 42, months 7 through 9. The third trimester is a time when the fetus is growing rapidly. At the end of the ninth month, the fetus is about 20 inches in length and weighs 6 10 pounds.   BODY CHANGES  Your body goes through many changes during pregnancy. The changes vary from woman to woman.    Your weight will continue to increase. You can expect to gain 25 35 pounds (11 16 kg) by the end of the pregnancy.   You may begin to get stretch marks on your hips, abdomen, and breasts.   You may urinate more often because the fetus is moving lower into your pelvis and pressing on your bladder.   You may develop or continue to have heartburn as a result of your pregnancy.   You may develop constipation because certain hormones are causing the muscles that push waste through your intestines to slow down.   You may develop hemorrhoids or swollen, bulging veins (varicose veins).   You may have pelvic pain because of the weight gain and pregnancy hormones relaxing your joints between the bones in your pelvis. Back aches may result from over exertion of the muscles supporting your posture.   Your breasts will continue to grow and be tender. A yellow discharge may leak from your breasts called colostrum.   Your belly button may stick out.   You may feel short of breath because of your expanding uterus.   You may notice the fetus "dropping," or moving lower in your abdomen.   You may have a bloody mucus discharge. This usually occurs a few days to a week before labor begins.   Your cervix becomes thin and soft (effaced) near your due date.  WHAT TO EXPECT AT YOUR PRENATAL EXAMS   You will have prenatal exams every 2 weeks until week 36. Then, you will have weekly prenatal exams. During a routine prenatal visit:   You will be weighed to make sure you and the fetus are growing normally.   Your blood pressure is taken.   Your abdomen will be  measured to track your baby's growth.   The fetal heartbeat will be listened to.   Any test results from the previous visit will be discussed.   You may have a cervical check near your due date to see if you have effaced.  At around 36 weeks, your caregiver will check your cervix. At the same time, your caregiver will also perform a test on the secretions of the vaginal tissue. This test is to determine if a type of bacteria, Group B streptococcus, is present. Your caregiver will explain this further.  Your caregiver may ask you:   What your birth plan is.   How you are feeling.   If you are feeling the baby move.   If you have had any abnormal symptoms, such as leaking fluid, bleeding, severe headaches, or abdominal cramping.   If you have any questions.  Other tests or screenings that may be performed during your third trimester include:   Blood tests that check for low iron levels (anemia).   Fetal testing to check the health, activity level, and growth of the fetus. Testing is done if you have certain medical conditions or if there are problems during the pregnancy.  FALSE LABOR  You may feel small, irregular contractions that eventually go away. These are called Braxton Hicks contractions, or   false labor. Contractions may last for hours, days, or even weeks before true labor sets in. If contractions come at regular intervals, intensify, or become painful, it is best to be seen by your caregiver.   SIGNS OF LABOR    Menstrual-like cramps.   Contractions that are 5 minutes apart or less.   Contractions that start on the top of the uterus and spread down to the lower abdomen and back.   A sense of increased pelvic pressure or back pain.   A watery or bloody mucus discharge that comes from the vagina.  If you have any of these signs before the 37th week of pregnancy, call your caregiver right away. You need to go to the hospital to get checked immediately.  HOME CARE INSTRUCTIONS    Avoid all  smoking, herbs, alcohol, and unprescribed drugs. These chemicals affect the formation and growth of the baby.   Follow your caregiver's instructions regarding medicine use. There are medicines that are either safe or unsafe to take during pregnancy.   Exercise only as directed by your caregiver. Experiencing uterine cramps is a good sign to stop exercising.   Continue to eat regular, healthy meals.   Wear a good support bra for breast tenderness.   Do not use hot tubs, steam rooms, or saunas.   Wear your seat belt at all times when driving.   Avoid raw meat, uncooked cheese, cat litter boxes, and soil used by cats. These carry germs that can cause birth defects in the baby.   Take your prenatal vitamins.   Try taking a stool softener (if your caregiver approves) if you develop constipation. Eat more high-fiber foods, such as fresh vegetables or fruit and whole grains. Drink plenty of fluids to keep your urine clear or pale yellow.   Take warm sitz baths to soothe any pain or discomfort caused by hemorrhoids. Use hemorrhoid cream if your caregiver approves.   If you develop varicose veins, wear support hose. Elevate your feet for 15 minutes, 3 4 times a day. Limit salt in your diet.   Avoid heavy lifting, wear low heal shoes, and practice good posture.   Rest a lot with your legs elevated if you have leg cramps or low back pain.   Visit your dentist if you have not gone during your pregnancy. Use a soft toothbrush to brush your teeth and be gentle when you floss.   A sexual relationship may be continued unless your caregiver directs you otherwise.   Do not travel far distances unless it is absolutely necessary and only with the approval of your caregiver.   Take prenatal classes to understand, practice, and ask questions about the labor and delivery.   Make a trial run to the hospital.   Pack your hospital bag.   Prepare the baby's nursery.   Continue to go to all your prenatal visits as directed  by your caregiver.  SEEK MEDICAL CARE IF:   You are unsure if you are in labor or if your water has broken.   You have dizziness.   You have mild pelvic cramps, pelvic pressure, or nagging pain in your abdominal area.   You have persistent nausea, vomiting, or diarrhea.   You have a bad smelling vaginal discharge.   You have pain with urination.  SEEK IMMEDIATE MEDICAL CARE IF:    You have a fever.   You are leaking fluid from your vagina.   You have spotting or bleeding from your vagina.     You have severe abdominal cramping or pain.   You have rapid weight loss or gain.   You have shortness of breath with chest pain.   You notice sudden or extreme swelling of your face, hands, ankles, feet, or legs.   You have not felt your baby move in over an hour.   You have severe headaches that do not go away with medicine.   You have vision changes.  Document Released: 05/20/2001 Document Revised: 01/26/2013 Document Reviewed: 07/27/2012  [employer] Patient Information 2014 [employer].

## 2013-10-05 NOTE — Progress Notes (Signed)
Pelvic pressure. S/sx labor reviewed. No irritaivive vaginal d/c and no intercourse since tx of chlamydia> TOC done. Not smoking any more. Plans breastfeeding. On Valtrex suppression.

## 2013-10-06 LAB — GC/CHLAMYDIA PROBE AMP
CT Probe RNA: POSITIVE — AB
GC PROBE AMP APTIMA: NEGATIVE

## 2013-10-10 ENCOUNTER — Telehealth: Payer: Self-pay

## 2013-10-10 DIAGNOSIS — A749 Chlamydial infection, unspecified: Secondary | ICD-10-CM

## 2013-10-10 MED ORDER — AZITHROMYCIN 500 MG PO TABS: 1000.0000 mg | ORAL_TABLET | Freq: Once | ORAL | Status: DC

## 2013-10-10 NOTE — Telephone Encounter (Signed)
Message copied by Louanna RawAMPBELL, Evone Arseneau M on Mon Oct 10, 2013  8:18 AM ------      Message from: Caren GriffinsPOE, DEIRDRE C      Created: Fri Oct 07, 2013 12:25 PM       Zithromax 2 gm po for +CT  ------

## 2013-10-10 NOTE — Telephone Encounter (Signed)
Attempted to call pt. No answer. Left message stating we are calling with results and important information, please call clinic at earliest convenience.

## 2013-10-10 NOTE — Telephone Encounter (Signed)
Pt. Called back. Informed of results and of prescription at pharmacy. Advised pt. To take medication on full stomach and to abstain from sexual intercourse until partner is treated. Pt. Verbalized understanding. No questions or concerns.

## 2013-10-12 ENCOUNTER — Inpatient Hospital Stay (HOSPITAL_COMMUNITY)
Admission: AD | Admit: 2013-10-12 | Discharge: 2013-10-12 | Disposition: A | Discharge status: Home / Self Care | Payer: Medicaid Other | Source: Ambulatory Visit | Attending: Obstetrics and Gynecology | Admitting: Obstetrics and Gynecology

## 2013-10-12 ENCOUNTER — Encounter: Payer: Medicaid Other | Admitting: Obstetrics & Gynecology

## 2013-10-12 ENCOUNTER — Inpatient Hospital Stay (HOSPITAL_COMMUNITY)
Admission: AD | Admit: 2013-10-12 | Discharge: 2013-10-12 | Disposition: A | Discharge status: Home / Self Care | Payer: Medicaid Other | Source: Ambulatory Visit | Attending: Obstetrics & Gynecology | Admitting: Obstetrics & Gynecology

## 2013-10-12 ENCOUNTER — Encounter (HOSPITAL_COMMUNITY): Payer: Self-pay | Admitting: General Practice

## 2013-10-12 DIAGNOSIS — O479 False labor, unspecified: Secondary | ICD-10-CM | POA: Insufficient documentation

## 2013-10-12 DIAGNOSIS — O0990 Supervision of high risk pregnancy, unspecified, unspecified trimester: Secondary | ICD-10-CM

## 2013-10-12 DIAGNOSIS — O98312 Other infections with a predominantly sexual mode of transmission complicating pregnancy, second trimester: Secondary | ICD-10-CM

## 2013-10-12 DIAGNOSIS — A749 Chlamydial infection, unspecified: Secondary | ICD-10-CM

## 2013-10-12 DIAGNOSIS — O98812 Other maternal infectious and parasitic diseases complicating pregnancy, second trimester: Secondary | ICD-10-CM

## 2013-10-12 DIAGNOSIS — Z6791 Unspecified blood type, Rh negative: Secondary | ICD-10-CM

## 2013-10-12 DIAGNOSIS — O99332 Smoking (tobacco) complicating pregnancy, second trimester: Secondary | ICD-10-CM

## 2013-10-12 DIAGNOSIS — A6009 Herpesviral infection of other urogenital tract: Secondary | ICD-10-CM

## 2013-10-12 DIAGNOSIS — O26899 Other specified pregnancy related conditions, unspecified trimester: Secondary | ICD-10-CM

## 2013-10-12 NOTE — MAU Note (Signed)
Having contractions. Started around 0830, regular since 1030, every 6 min. No bleeding or leaking. 1st baby, no problems with preg.

## 2013-10-12 NOTE — Discharge Instructions (Signed)

## 2013-10-12 NOTE — MAU Note (Signed)
Pt G4 P0 at 39.4wks having contractions all day. Pt was seen earlier today SVE 1cm.  Denies bleeding or leaking.

## 2013-10-13 ENCOUNTER — Encounter (HOSPITAL_COMMUNITY): Payer: Self-pay | Admitting: *Deleted

## 2013-10-13 ENCOUNTER — Inpatient Hospital Stay (HOSPITAL_COMMUNITY)
Admission: AD | Admit: 2013-10-13 | Discharge: 2013-10-15 | DRG: 775 | Disposition: A | Discharge status: Home / Self Care | Payer: Medicaid Other | Source: Ambulatory Visit | Attending: Obstetrics and Gynecology | Admitting: Obstetrics and Gynecology

## 2013-10-13 ENCOUNTER — Encounter: Payer: Self-pay | Admitting: Obstetrics and Gynecology

## 2013-10-13 ENCOUNTER — Inpatient Hospital Stay (HOSPITAL_COMMUNITY): Payer: Medicaid Other | Admitting: Anesthesiology

## 2013-10-13 ENCOUNTER — Encounter (HOSPITAL_COMMUNITY): Payer: Medicaid Other | Admitting: Anesthesiology

## 2013-10-13 DIAGNOSIS — O0990 Supervision of high risk pregnancy, unspecified, unspecified trimester: Secondary | ICD-10-CM

## 2013-10-13 DIAGNOSIS — E669 Obesity, unspecified: Secondary | ICD-10-CM | POA: Diagnosis present

## 2013-10-13 DIAGNOSIS — O99214 Obesity complicating childbirth: Secondary | ICD-10-CM

## 2013-10-13 DIAGNOSIS — IMO0001 Reserved for inherently not codable concepts without codable children: Secondary | ICD-10-CM

## 2013-10-13 DIAGNOSIS — A749 Chlamydial infection, unspecified: Secondary | ICD-10-CM

## 2013-10-13 DIAGNOSIS — Z6841 Body Mass Index (BMI) 40.0 and over, adult: Secondary | ICD-10-CM

## 2013-10-13 DIAGNOSIS — O98312 Other infections with a predominantly sexual mode of transmission complicating pregnancy, second trimester: Secondary | ICD-10-CM

## 2013-10-13 DIAGNOSIS — O98812 Other maternal infectious and parasitic diseases complicating pregnancy, second trimester: Secondary | ICD-10-CM

## 2013-10-13 DIAGNOSIS — Z6791 Unspecified blood type, Rh negative: Secondary | ICD-10-CM

## 2013-10-13 DIAGNOSIS — O9989 Other specified diseases and conditions complicating pregnancy, childbirth and the puerperium: Secondary | ICD-10-CM

## 2013-10-13 DIAGNOSIS — Z87891 Personal history of nicotine dependence: Secondary | ICD-10-CM

## 2013-10-13 DIAGNOSIS — O99892 Other specified diseases and conditions complicating childbirth: Secondary | ICD-10-CM | POA: Diagnosis present

## 2013-10-13 DIAGNOSIS — O26899 Other specified pregnancy related conditions, unspecified trimester: Secondary | ICD-10-CM

## 2013-10-13 DIAGNOSIS — Z22330 Carrier of Group B streptococcus: Secondary | ICD-10-CM

## 2013-10-13 DIAGNOSIS — A6009 Herpesviral infection of other urogenital tract: Secondary | ICD-10-CM

## 2013-10-13 DIAGNOSIS — O99332 Smoking (tobacco) complicating pregnancy, second trimester: Secondary | ICD-10-CM

## 2013-10-13 LAB — CBC
HCT: 35.1 % — ABNORMAL LOW (ref 36.0–46.0)
Hemoglobin: 11.7 g/dL — ABNORMAL LOW (ref 12.0–15.0)
MCH: 31.3 pg (ref 26.0–34.0)
MCHC: 33.3 g/dL (ref 30.0–36.0)
MCV: 93.9 fL (ref 78.0–100.0)
Platelets: 218 10*3/uL (ref 150–400)
RBC: 3.74 MIL/uL — ABNORMAL LOW (ref 3.87–5.11)
RDW: 13.3 % (ref 11.5–15.5)
WBC: 12.6 10*3/uL — ABNORMAL HIGH (ref 4.0–10.5)

## 2013-10-13 LAB — TYPE AND SCREEN
ABO/RH(D): A NEG
Antibody Screen: NEGATIVE

## 2013-10-13 LAB — RPR

## 2013-10-13 MED ORDER — ONDANSETRON HCL 4 MG/2ML IJ SOLN: 4.0000 mg | INTRAMUSCULAR | Status: DC | PRN

## 2013-10-13 MED ORDER — TETANUS-DIPHTH-ACELL PERTUSSIS 5-2.5-18.5 LF-MCG/0.5 IM SUSP: 0.5000 mL | Freq: Once | INTRAMUSCULAR | Status: DC

## 2013-10-13 MED ORDER — FENTANYL 2.5 MCG/ML BUPIVACAINE 1/10 % EPIDURAL INFUSION (WH - ANES)
14.0000 mL/h | INTRAMUSCULAR | Status: DC | PRN
Start: 2013-10-13 — End: 2013-10-13
  Administered 2013-10-13: 14 mL/h via EPIDURAL
  Filled 2013-10-13 (×2): qty 125

## 2013-10-13 MED ORDER — LANOLIN HYDROUS EX OINT: TOPICAL_OINTMENT | CUTANEOUS | Status: DC | PRN

## 2013-10-13 MED ORDER — DIPHENHYDRAMINE HCL 25 MG PO CAPS: 25.0000 mg | ORAL_CAPSULE | Freq: Four times a day (QID) | ORAL | Status: DC | PRN

## 2013-10-13 MED ORDER — PRENATAL MULTIVITAMIN CH
1.0000 | ORAL_TABLET | Freq: Every day | ORAL | Status: DC
  Administered 2013-10-15: 1 via ORAL
  Filled 2013-10-13 (×2): qty 1

## 2013-10-13 MED ORDER — OXYCODONE-ACETAMINOPHEN 5-325 MG PO TABS: 1.0000 | ORAL_TABLET | ORAL | Status: DC | PRN

## 2013-10-13 MED ORDER — LACTATED RINGERS IV SOLN
INTRAVENOUS | Status: DC
  Administered 2013-10-13: 05:00:00 via INTRAVENOUS

## 2013-10-13 MED ORDER — OXYTOCIN 40 UNITS IN LACTATED RINGERS INFUSION - SIMPLE MED
1.0000 m[IU]/min | INTRAVENOUS | Status: DC
Start: 2013-10-13 — End: 2013-10-13
  Administered 2013-10-13: 2 m[IU]/min via INTRAVENOUS

## 2013-10-13 MED ORDER — SENNOSIDES-DOCUSATE SODIUM 8.6-50 MG PO TABS
2.0000 | ORAL_TABLET | ORAL | Status: DC
  Administered 2013-10-14 (×2): 2 via ORAL
  Filled 2013-10-13 (×2): qty 2

## 2013-10-13 MED ORDER — ZOLPIDEM TARTRATE 5 MG PO TABS: 5.0000 mg | ORAL_TABLET | Freq: Every evening | ORAL | Status: DC | PRN

## 2013-10-13 MED ORDER — PHENYLEPHRINE 40 MCG/ML (10ML) SYRINGE FOR IV PUSH (FOR BLOOD PRESSURE SUPPORT)
80.0000 ug | PREFILLED_SYRINGE | INTRAVENOUS | Status: DC | PRN
  Filled 2013-10-13: qty 2
  Filled 2013-10-13: qty 10

## 2013-10-13 MED ORDER — LIDOCAINE HCL (PF) 1 % IJ SOLN
INTRAMUSCULAR | Status: DC | PRN
  Administered 2013-10-13 (×3): 5 mL

## 2013-10-13 MED ORDER — FENTANYL 2.5 MCG/ML BUPIVACAINE 1/10 % EPIDURAL INFUSION (WH - ANES)
INTRAMUSCULAR | Status: DC | PRN
  Administered 2013-10-13: 14 mL/h via EPIDURAL

## 2013-10-13 MED ORDER — LACTATED RINGERS IV SOLN
500.0000 mL | Freq: Once | INTRAVENOUS | Status: AC
  Administered 2013-10-13: 500 mL via INTRAVENOUS

## 2013-10-13 MED ORDER — OXYCODONE-ACETAMINOPHEN 5-325 MG PO TABS
1.0000 | ORAL_TABLET | ORAL | Status: DC | PRN
  Administered 2013-10-14: 1 via ORAL
  Administered 2013-10-15: 2 via ORAL
  Filled 2013-10-13: qty 1
  Filled 2013-10-13: qty 2

## 2013-10-13 MED ORDER — ONDANSETRON HCL 4 MG PO TABS
4.0000 mg | ORAL_TABLET | ORAL | Status: DC | PRN
Start: 2013-10-13 — End: 2013-10-15

## 2013-10-13 MED ORDER — IBUPROFEN 600 MG PO TABS: 600.0000 mg | ORAL_TABLET | Freq: Four times a day (QID) | ORAL | Status: DC | PRN

## 2013-10-13 MED ORDER — DIBUCAINE 1 % RE OINT: 1.0000 "application " | TOPICAL_OINTMENT | RECTAL | Status: DC | PRN

## 2013-10-13 MED ORDER — PENICILLIN G POTASSIUM 5000000 UNITS IJ SOLR
2.5000 10*6.[IU] | INTRAMUSCULAR | Status: DC
  Administered 2013-10-13 (×2): 2.5 10*6.[IU] via INTRAVENOUS
  Filled 2013-10-13 (×6): qty 2.5

## 2013-10-13 MED ORDER — EPHEDRINE 5 MG/ML INJ
10.0000 mg | INTRAVENOUS | Status: DC | PRN
  Filled 2013-10-13: qty 2
  Filled 2013-10-13: qty 4

## 2013-10-13 MED ORDER — CITRIC ACID-SODIUM CITRATE 334-500 MG/5ML PO SOLN
30.0000 mL | ORAL | Status: DC | PRN
Start: 2013-10-13 — End: 2013-10-13

## 2013-10-13 MED ORDER — LACTATED RINGERS IV SOLN
500.0000 mL | INTRAVENOUS | Status: DC | PRN
  Administered 2013-10-13: 1000 mL via INTRAVENOUS

## 2013-10-13 MED ORDER — SIMETHICONE 80 MG PO CHEW: 80.0000 mg | CHEWABLE_TABLET | ORAL | Status: DC | PRN

## 2013-10-13 MED ORDER — LIDOCAINE HCL (PF) 1 % IJ SOLN
30.0000 mL | INTRAMUSCULAR | Status: AC | PRN
  Administered 2013-10-13: 30 mL via SUBCUTANEOUS
  Filled 2013-10-13: qty 30

## 2013-10-13 MED ORDER — PHENYLEPHRINE 40 MCG/ML (10ML) SYRINGE FOR IV PUSH (FOR BLOOD PRESSURE SUPPORT)
80.0000 ug | PREFILLED_SYRINGE | INTRAVENOUS | Status: DC | PRN
  Filled 2013-10-13: qty 2

## 2013-10-13 MED ORDER — OXYTOCIN BOLUS FROM INFUSION: 500.0000 mL | INTRAVENOUS | Status: DC

## 2013-10-13 MED ORDER — OXYTOCIN 40 UNITS IN LACTATED RINGERS INFUSION - SIMPLE MED
62.5000 mL/h | INTRAVENOUS | Status: DC
  Administered 2013-10-13: 62.5 mL/h via INTRAVENOUS
  Filled 2013-10-13: qty 1000

## 2013-10-13 MED ORDER — TERBUTALINE SULFATE 1 MG/ML IJ SOLN: 0.2500 mg | Freq: Once | INTRAMUSCULAR | Status: DC | PRN

## 2013-10-13 MED ORDER — FENTANYL CITRATE 0.05 MG/ML IJ SOLN: 100.0000 ug | INTRAMUSCULAR | Status: DC | PRN

## 2013-10-13 MED ORDER — EPHEDRINE 5 MG/ML INJ
10.0000 mg | INTRAVENOUS | Status: DC | PRN
  Filled 2013-10-13: qty 2

## 2013-10-13 MED ORDER — DIPHENHYDRAMINE HCL 50 MG/ML IJ SOLN: 12.5000 mg | INTRAMUSCULAR | Status: DC | PRN

## 2013-10-13 MED ORDER — IBUPROFEN 600 MG PO TABS
600.0000 mg | ORAL_TABLET | Freq: Four times a day (QID) | ORAL | Status: DC
  Administered 2013-10-13 – 2013-10-15 (×7): 600 mg via ORAL
  Filled 2013-10-13 (×8): qty 1

## 2013-10-13 MED ORDER — DEXTROSE 5 % IV SOLN
5.0000 10*6.[IU] | Freq: Once | INTRAVENOUS | Status: AC
  Administered 2013-10-13: 5 10*6.[IU] via INTRAVENOUS
  Filled 2013-10-13: qty 5

## 2013-10-13 MED ORDER — ONDANSETRON HCL 4 MG/2ML IJ SOLN
4.0000 mg | Freq: Four times a day (QID) | INTRAMUSCULAR | Status: DC | PRN
Start: 2013-10-13 — End: 2013-10-13

## 2013-10-13 MED ORDER — ACETAMINOPHEN 325 MG PO TABS: 650.0000 mg | ORAL_TABLET | ORAL | Status: DC | PRN

## 2013-10-13 MED ORDER — WITCH HAZEL-GLYCERIN EX PADS: 1.0000 "application " | MEDICATED_PAD | CUTANEOUS | Status: DC | PRN

## 2013-10-13 MED ORDER — BENZOCAINE-MENTHOL 20-0.5 % EX AERO
1.0000 "application " | INHALATION_SPRAY | CUTANEOUS | Status: DC | PRN
  Filled 2013-10-13: qty 56

## 2013-10-13 NOTE — Anesthesia Preprocedure Evaluation (Signed)
Anesthesia Evaluation  Patient identified by MRN, date of birth, ID band Patient awake    Reviewed: Allergy & Precautions, H&P , NPO status , Patient's Chart, lab work & pertinent test results  History of Anesthesia Complications Negative for: history of anesthetic complications  Airway Mallampati: II TM Distance: >3 FB Neck ROM: full    Dental no notable dental hx. (+) Teeth Intact   Pulmonary neg pulmonary ROS, asthma , former smoker,  breath sounds clear to auscultation  Pulmonary exam normal       Cardiovascular negative cardio ROS  Rhythm:regular Rate:Normal     Neuro/Psych negative neurological ROS  negative psych ROS   GI/Hepatic negative GI ROS, Neg liver ROS,   Endo/Other  negative endocrine ROSMorbid obesity  Renal/GU negative Renal ROS  negative genitourinary   Musculoskeletal   Abdominal Normal abdominal exam  (+)   Peds  Hematology negative hematology ROS (+)   Anesthesia Other Findings   Reproductive/Obstetrics (+) Pregnancy                           Anesthesia Physical Anesthesia Plan  ASA: II  Anesthesia Plan: Epidural   Post-op Pain Management:    Induction:   Airway Management Planned:   Additional Equipment:   Intra-op Plan:   Post-operative Plan:   Informed Consent: I have reviewed the patients History and Physical, chart, labs and discussed the procedure including the risks, benefits and alternatives for the proposed anesthesia with the patient or authorized representative who has indicated his/her understanding and acceptance.     Plan Discussed with:   Anesthesia Plan Comments:         Anesthesia Quick Evaluation

## 2013-10-13 NOTE — Progress Notes (Signed)
Cheyenne Walton is a 23 y.o. G4P0030 at 1535w5d   Subjective: Just got epidural- comfortable  Objective: BP 109/54  Pulse 74  Temp(Src) 98.5 F (36.9 C) (Oral)  Resp 20  SpO2 97%  LMP 01/08/2013      FHT:  FHR: 140s bpm, variability: moderate,  accelerations:  Present,  decelerations:  Absent UC:   irregular, every 3-5 minutes SVE:   Dilation: 5 Effacement (%): 90 Station: -2 Exam by:: Rudi CocoM. Robb RN  Labs: Lab Results  Component Value Date   WBC 12.6* 10/13/2013   HGB 11.7* 10/13/2013   HCT 35.1* 10/13/2013   MCV 93.9 10/13/2013   PLT 218 10/13/2013    Assessment / Plan: Early active labor GBS+- received PCN  Will reeval re need for Pitocin/AROM if ctx space out    Arabella MerlesKimberly D Yohana Bartha 10/13/2013, 7:51 AM

## 2013-10-13 NOTE — Progress Notes (Addendum)
Cheyenne Walton is a 23 y.o. G4P0030 at 5042w5d admitted for SOL.  Subjective: Comfortable, some increased pressure, improved s/p repeat bolus from Epidural.  Objective: BP 103/53  Pulse 71  Temp(Src) 98 F (36.7 C) (Oral)  Resp 18  SpO2 97%  LMP 01/08/2013      FHT:  FHR: 130 bpm, variability: Marked,  accelerations:  Present,  decelerations:  Absent (note difficulty maintaining constant strip d/t positioning / belt) UC:   Regular q 2-4 min SVE:   Dilation: 9 Effacement (%): 100 Station: 0;+1 Exam by:: Dr. Ike Benedom  Labs: Lab Results  Component Value Date   WBC 12.6* 10/13/2013   HGB 11.7* 10/13/2013   HCT 35.1* 10/13/2013   MCV 93.9 10/13/2013   PLT 218 10/13/2013    Assessment / Plan: Cheyenne Walton is a 23 y.o. G4P0030 at 9242w5d admitted for SOL.  #Labor: SOL, S/p AROM @ 0945, continued progress and inc pressure. Placed FSE and IUPC @ 1430. Start Pitocin 2/2 with inadequate contractions and some poor progression. #Pain: Epidural (x 3 repeat bolus doses) #FWB: CAT-2 with marked variability #ID: GBS (positive), s/p PCN #Delivery - anticipate NSVD  [name], DO [facility], PGY-1 10/13/2013, 2:35 PM

## 2013-10-13 NOTE — Anesthesia Procedure Notes (Signed)
Epidural Patient location during procedure: OB  Staffing Anesthesiologist: Olamae Ferrara Performed by: anesthesiologist   Preanesthetic Checklist Completed: patient identified, site marked, surgical consent, pre-op evaluation, timeout performed, IV checked, risks and benefits discussed and monitors and equipment checked  Epidural Patient position: sitting Prep: ChloraPrep Patient monitoring: heart rate, continuous pulse ox and blood pressure Approach: right paramedian Location: L3-L4 Injection technique: LOR saline  Needle:  Needle type: Tuohy  Needle gauge: 17 G Needle length: 9 cm and 9 Needle insertion depth: 7 cm Catheter type: closed end flexible Catheter size: 20 Guage Catheter at skin depth: 11 cm Test dose: negative  Assessment Events: blood not aspirated, injection not painful, no injection resistance, negative IV test and no paresthesia  Additional Notes   Patient tolerated the insertion well without complications.   

## 2013-10-13 NOTE — H&P (Signed)
`````  Attestation of Attending Supervision of Advanced Practitioner: Evaluation and management procedures were performed by the PA/NP/CNM/OB Fellow under my supervision/collaboration. Chart reviewed and agree with management and plan.  Tilda BurrowJohn V Mima Cranmore 10/13/2013 7:16 PM

## 2013-10-13 NOTE — MAU Note (Signed)
Dr. Marily Lenteestrepo notified of pt, will put in orders for admission.

## 2013-10-13 NOTE — MAU Note (Signed)
Pt arrived via EMS for labor eval.  Having contractions every .  Denies leaking or bleeding.

## 2013-10-13 NOTE — Progress Notes (Signed)
Cheyenne Walton is a 23 y.o. G4P0030 at 7373w5d   Subjective: Feeling increasing pelvic pressure and discomfort with foley. Denies any pain after epidural, and otherwise comfortable.  Objective: BP 77/52  Pulse 80  Temp(Src) 98.5 F (36.9 C) (Oral)  Resp 18  SpO2 97%  LMP 01/08/2013      (pre-AROM) FHT:  FHR: 140s bpm, variability: moderate,  accelerations:  Present,  decelerations:  Absent (s/p AROM) FHT: 130s bpm, moderate variability, no accels, decels x 1 prolonged variable with difficult tracing, x 2 early decels. (position change R-lateral)  UC:   Regular q 2-3 min SVE:   Dilation: 7.5 Effacement (%): 100 Station: 0 Exam by:: Cheyenne SlipperJane Bailey, RN  Labs: Lab Results  Component Value Date   WBC 12.6* 10/13/2013   HGB 11.7* 10/13/2013   HCT 35.1* 10/13/2013   MCV 93.9 10/13/2013   PLT 218 10/13/2013    Assessment / Plan: Cheyenne Walton is a 23 y.o. G4P0030 at 7273w5d by #Labor: SOL, expectant management. S/p AROM @ 0945 with thick meconium fluid (head engaged, no cord), followed by variable decels, since improved #Pain: Epidural #FWB: CAT-2 prolonged variables #ID:  GBS (positive), s/p PCN #Delivery - anticipate NSVD  [name], DO [facility], PGY-1 10/13/2013, 10:05 AM  I spoke with and examined patient and agree with resident's note and plan of care.  Tawana ScaleMichael Ryan Jaydis Duchene, MD OB Fellow 10/13/2013 3:29 PM

## 2013-10-13 NOTE — H&P (Signed)
Cheyenne Walton is a 23 y.o. female [phone] with IUP at 2955w5d presenting for contractions every 2 minutes. No vaginal bleeding, no LOF.  Positive Fetal movement.  [facility]  Prenatal History/Complications:  Past Medical History: Past Medical History  Diagnosis Date  . UTI (urinary tract infection)   . Ectopic pregnancy   . Asthma     "haven't used an inhaler in years"    Past Surgical History: Past Surgical History  Procedure Laterality Date  . Laparoscopy  09/13/2011    Procedure: LAPAROSCOPY OPERATIVE;  Surgeon: Tereso NewcomerUgonna A Anyanwu, MD;  Location: [facility];  Service: Gynecology;  Laterality: Left;  operative laparoscopy left salpingectomy with removal of ectopic pregnancy.  . Ectopic pregnancy surgery    . Laparoscopic unilateral salpingectomy      LEFT SIDE with ectopic pregnancy    Obstetrical History: OB History   Grav Para Term Preterm Abortions TAB SAB Ect Mult Living   4 0 0 0 3 0 2 1 0 0       Gynecological History:  Social History: History   Social History  . Marital Status: Single    Spouse Name: N/A    Number of Children: N/A  . Years of Education: N/A   Social History Main Topics  . Smoking status: Former Smoker -- 1.00 packs/day    Quit date: 04/08/2012  . Smokeless tobacco: Never Used  . Alcohol Use: No  . Drug Use: Yes    Special: Marijuana     Comment: last use was: 2 mos  . Sexual Activity: Yes    Birth Control/ Protection: None   Other Topics Concern  . None   Social History Narrative  . None    Family History: Family History  Problem Relation Age of Onset  . Anesthesia problems Neg Hx   . Hypotension Neg Hx   . Malignant hyperthermia Neg Hx   . Pseudochol deficiency Neg Hx   . Cancer Maternal Grandmother     Allergies: Allergies  Allergen Reactions  . Contrast Media [Iodinated Diagnostic Agents] Anaphylaxis  . Other Anaphylaxis, Rash and Other (See Comments)    Seafood - throat swelling  . Latex Hives     Prescriptions prior to admission  Medication Sig Dispense Refill  . folic acid (FOLVITE) 1 MG tablet Take 1 tablet (1 mg total) by mouth daily.  30 tablet  5  . metroNIDAZOLE (FLAGYL) 500 MG tablet Take 1 tablet (500 mg total) by mouth 2 (two) times daily.  14 tablet  0  . Prenatal Vit-Fe Fumarate-FA (PRENATAL VITAMINS) 28-0.8 MG TABS Take 1 tablet by mouth daily.  30 tablet  2  . valACYclovir (VALTREX) 500 MG tablet Take two tablets by mouth twice daily for ten days; then one tablet twice daily for remainder of pregnancy  180 tablet  2     Review of Systems   Constitutional: No fever or chills HEENT: No headache, no visual changes CV: No chest pain Resp: no dyspnea Abdomen: ctx every 2 minutes Ext: no edema  Blood pressure 103/69, pulse 77, temperature 97.9 F (36.6 C), temperature source Oral, resp. rate 20, last menstrual period 01/08/2013, SpO2 97.00%. General appearance: alert and no distress Lungs: clear to auscultation bilaterally Heart: regular rate and rhythm Abdomen: soft, non-tender; bowel sounds norma Extremities: Homans sign is negative, no sign of DVT Presentation: Cephalic Fetal monitoring: 140s, + accels, moderate variability, no decels, category I tracing Uterine activity every 2-685minutes Dilation: 5 Effacement (%): 90 Station: -  2 Exam by:: Rudi CocoM. Robb RN   Prenatal labs: ABO, Rh: A/NEG/-- (10/02 1559) Antibody: NEG (10/02 1559) Rubella:   RPR: NON REAC (02/12 1036)  HBsAg: NEGATIVE (10/02 1559)  HIV: NON REACTIVE (10/02 1559)  GBS: Positive (04/14 0000)  1 hr Glucola negative Genetic screening  negative Anatomy US wnl   Prenatal Transfer Tool  Maternal Diabetes: No Genetic Screening: Normal Maternal Ultrasounds/Referrals: Normal Fetal Ultrasounds or other Referrals:  None Maternal Substance Abuse:  No Significant Maternal Medications:  None Significant Maternal Lab Results: Lab values include: Group B Strep positive     Results for orders  placed during the hospital encounter of 10/13/13 (from the past 24 hour(s))  CBC   Collection Time    10/13/13  4:35 AM      Result Value Ref Range   WBC 12.6 (*) 4.0 - 10.5 K/uL   RBC 3.74 (*) 3.87 - 5.11 MIL/uL   Hemoglobin 11.7 (*) 12.0 - 15.0 g/dL   HCT 62.935.1 (*) 52.836.0 - 41.346.0 %   MCV 93.9  78.0 - 100.0 fL   MCH 31.3  26.0 - 34.0 pg   MCHC 33.3  30.0 - 36.0 g/dL   RDW 24.413.3  01.011.5 - 27.215.5 %   Platelets 218  150 - 400 K/uL    Assessment: Cheyenne Walton is a 23 y.o. G4P0030 at 7224w5d by LMP here for SOL. #Labor: progressing spontaneously, no augmentation for now, membranes intact #Pain: S/p Epidural #FWB: Category I tracing #ID:  GBS positive, intrapartum penicillin #MOF: Breast #MOC:Mirenta #MOD: expect NSVD  Robyn H Restrepo 10/13/2013, 5:36 AM  I have seen and examined this patient and I agree with the above. Arabella MerlesKimberly D Carmita Boom 6:57 AM 10/13/2013

## 2013-10-14 MED ORDER — RHO D IMMUNE GLOBULIN 1500 UNIT/2ML IJ SOSY
300.0000 ug | PREFILLED_SYRINGE | Freq: Once | INTRAMUSCULAR | Status: AC
  Administered 2013-10-14: 300 ug via INTRAMUSCULAR
  Filled 2013-10-14: qty 2

## 2013-10-14 NOTE — Anesthesia Postprocedure Evaluation (Signed)
  Anesthesia Post-op Note  Anesthesia Post Note  Patient: Cheyenne Walton  Procedure(s) Performed: * No procedures listed *  Anesthesia type: Epidural  Patient location: Mother/Baby  Post pain: Pain level controlled  Post assessment: Post-op Vital signs reviewed  Last Vitals:  Filed Vitals:   10/14/13 0612  BP: 108/51  Pulse: 73  Temp: 36.8 C  Resp: 17    Post vital signs: Reviewed  Level of consciousness:alert  Complications: No apparent anesthesia complications

## 2013-10-14 NOTE — Progress Notes (Signed)
Post Partum Day 1 Subjective: Patient reports some mild back pain and occasional cramping, improved with Motrin, stable vaginal bleeding, tolerating ambulation without dizziness / lightheadedness. Tolerating PO. Breastfeeding well.  Objective: Blood pressure 108/51, pulse 73, temperature 98.3 F (36.8 C), temperature source Oral, resp. rate 17, last menstrual period 01/08/2013, SpO2 97.00%, unknown if currently breastfeeding.  Physical Exam:  General: alert and cooperative Lochia: appropriate Uterine Fundus: firm, U-1 DVT Evaluation: No evidence of DVT seen on physical exam. Negative Homan's sign. No cords or calf tenderness. No significant calf/ankle edema.   Recent Labs  10/13/13 0435  HGB 11.7*  HCT 35.1*    Assessment/Plan: Cheyenne Walton is a 23 y.o. [phone] delivered baby girl via [place] Vac assisted delivery on 5/7 @ 1745 Plan for discharge tomorrow, Breastfeeding, Lactation consult and Contraception Mirena IUD   LOS: 1 day   Cheyenne PilarAlexander Karamalegos, DO [facility], PGY-1 10/14/2013, 7:48 AM   I have seen and examined this patient and agree the above assessment. Cheyenne Walton 10/14/2013 7:59 AM

## 2013-10-15 LAB — RH IG WORKUP (INCLUDES ABO/RH)
ABO/RH(D): A NEG
FETAL SCREEN: NEGATIVE
GESTATIONAL AGE(WKS): 39
Unit division: 0

## 2013-10-15 MED ORDER — MEASLES, MUMPS & RUBELLA VAC ~~LOC~~ INJ
0.5000 mL | INJECTION | Freq: Once | SUBCUTANEOUS | Status: AC
  Administered 2013-10-15: 0.5 mL via SUBCUTANEOUS
  Filled 2013-10-15: qty 0.5

## 2013-10-15 MED ORDER — IBUPROFEN 600 MG PO TABS: 600.0000 mg | ORAL_TABLET | Freq: Four times a day (QID) | ORAL | Status: DC

## 2013-10-15 NOTE — Progress Notes (Signed)
Clinical Social Work Department PSYCHOSOCIAL ASSESSMENT - MATERNAL/CHILD 10/15/2013  Patient:  Cheyenne Walton,Cheyenne Walton  Account Number:  [id number]  Admit Date:  10/13/2013  Marjo Bickerhilds Name:   James IvanoffNy'Ayla Michelle Young    Clinical Social Worker:  Claudene Gatliff, LCSW   Date/Time:  10/15/2013 12:30 PM  Date Referred:  10/15/2013   Referral source  Central Nursery     Referred reason  Substance Abuse   Other referral source:    I:  FAMILY / HOME ENVIRONMENT Child's legal guardian:  PARENT  Guardian - Name Guardian - Age Guardian - Address  Motyka,Maribelle 22 [address]   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   Financial resources:  Medicaid If Medicaid - [place]:   Other  [employer]  Food Stamps   School / Grade:   Maternity Care Coordinator / Child Services Coordination / Early Interventions:  Cultural issues impacting care:    III  STRENGTHS Strengths  Home prepared for Child (including basic supplies)  Supportive family/friends  Adequate Resources   Strength comment:    IV  RISK FACTORS AND CURRENT PROBLEMS Current Problem:     Risk Factor & Current Problem Patient Issue Family Issue Risk Factor / Current Problem Comment  Substance Abuse Y N Mother has hx of marijuana use   N N     V  SOCIAL WORK ASSESSMENT Acknowledged Social Work consult to assess mother's history of marijuana.  Mother was receptive to social work intervention.  She is a single parent with no other dependents.  She and FOB cohabitate.  Both she and FOB are employed.  Mother reports hx of recreational use of marijuana.  She denies any other illicit drug use. Discussed possible negative consequences of continue use of marijuana.  She was receptive to the information.  She denies the  need for any treatment.      Mother informed of the hospitals drug screen policy.  UDS on newborn was negative.  Mother denies any hx of mental illness. Informed her of CSW availability.      VI SOCIAL WORK PLAN Social Work Plan  Barriers to Discharge   Type of pt/family education:   If child protective services report - county:   If child protective services report - date:   Information/referral to community resources comment:   [facility]   Other social work plan:   Will continue to monitor drug screen

## 2013-10-15 NOTE — Lactation Note (Signed)
This note was copied from the chart of Cheyenne Walton. Lactation Consultation Note: Follow up visit with mom before DC. Mom reports that baby is nursing much better with no pain now. Reports that baby last fed at 4 am, she tried to wake her at 7 but she was too sleepy. I undressed her and she woke rooting. Mom latched baby with minimal help from me.Swallows noted. Mom able to hand express transitional milk. Reports that breasts are feeling fuller this morning. Reviewed BFSG and OP appointments as resources for support after DC. No questions at present. To call prn  Patient Name: Cheyenne Glenice BowBrooke Carl ZOXWR'UToday's Date: 10/15/2013 Reason for consult: Follow-up assessment   Maternal Data Formula Feeding for Exclusion: No Reason for exclusion: Mother's choice to formula and breast feed on admission Infant to breast within first hour of birth: Yes Has patient been taught Hand Expression?: Yes Does the patient have breastfeeding experience prior to this delivery?: No  Feeding Feeding Type: Breast Fed Length of feed: 10 min  LATCH Score/Interventions Latch: Grasps breast easily, tongue down, lips flanged, rhythmical sucking.  Audible Swallowing: A few with stimulation  Type of Nipple: Everted at rest and after stimulation  Comfort (Breast/Nipple): Soft / non-tender     Hold (Positioning): Assistance needed to correctly position infant at breast and maintain latch. Intervention(s): Breastfeeding basics reviewed;Support Pillows;Position options  LATCH Score: 8  Lactation Tools Discussed/Used     Consult Status Consult Status: Complete    Pamelia HoitDonna D Fatih Stalvey 10/15/2013, 9:00 AM

## 2013-10-15 NOTE — Discharge Summary (Signed)
Obstetric Discharge Summary Reason for Admission: onset of labor Prenatal Procedures: none Intrapartum Procedures: vacuum Postpartum Procedures: none Complications-Operative and Postpartum: second degree perineal laceration Hemoglobin  Date Value Ref Range Status  10/13/2013 11.7* 12.0 - 15.0 g/dL Final     HCT  Date Value Ref Range Status  10/13/2013 35.1* 36.0 - 46.0 % Final    Physical Exam:  General: alert and no distress Lochia: appropriate Uterine Fundus: firm Incision: NA DVT Evaluation: No evidence of DVT seen on physical exam. Negative Homan's sign.  Discharge Diagnoses: Term Pregnancy-delivered  Hospital Course:  Cheyenne Walton is a 23 y.o. female 394P0030 who presented for SOL at 3941w5d   At 5:08 PM a viable female was delivered via Vaginal, Vacuum Investment banker, operational(Extractor). Presentation: vertex; Position: Left,, Occiput,, Anterior; Station: +5.  Verbal consent: unable to obtain verbal consent due to emergency. Risks and benefits discussed in detail. Risks include, but are not limited to the risks of anesthesia, bleeding, infection, damage to maternal tissues, fetal cephalhematoma. There is also the risk of inability to effect vaginal delivery of the head, or shoulder dystocia that cannot be resolved by established maneuvers, leading to the need for emergency cesarean section.  APGAR: , ; weight .  Placenta status: Intact, Spontaneous.  Cord: 3 vessels with the following complications: None. Cord pH:7.18  Anesthesia: Epidural  Instruments: Kiwi vacuum  Episiotomy: None  Lacerations: Periurethral  Suture Repair: 4.0 vicryl on SH  Est. Blood Loss (mL): 400  Mom to postpartum. Baby to Couplet care / Skin to Skin  Called to delivery. Infant with deep decels When crowning. Outlet vacuum placed and with one push/pull delivered over intact perineum c/w periclitorral tear. Infant delivered to maternal abdomen. Cord clamped and cut. Active management of 3rd stage with traction. Placenta  delivered intact with 3v cord. Tear repaired with 4.0 vicryl on SH in usual manner required a figure of 8. [id number]. Counts correct. Hemostatic.   Patient was stable during the postpartum period and was discharged home on PPD#2. Breastfeeding. Plans Mirena for contraception.   Discharge Information: Date: 10/15/2013 Activity: pelvic rest Diet: routine Medications: Ibuprofen Condition: stable Instructions: refer to practice specific booklet Discharge to: home   Newborn Data: Live born female  Birth Weight: 7 lb 7.9 oz (3399 g) APGAR: 8, 9  Home with mother.  Cheyenne Walton 10/15/2013, 8:41 AM  I have seen and examined this patient and I agree with the above. Arabella MerlesKimberly D Faren Florence CNM 1:30 AM 10/20/2013

## 2013-10-15 NOTE — Discharge Instructions (Signed)
Breastfeeding Deciding to breastfeed is one of the best choices you can make for you and your baby. A change in hormones during pregnancy causes your breast tissue to grow and increases the number and size of your milk ducts. These hormones also allow proteins, sugars, and fats from your blood supply to make breast milk in your milk-producing glands. Hormones prevent breast milk from being released before your baby is born as well as prompt milk flow after birth. Once breastfeeding has begun, thoughts of your baby, as well as his or her sucking or crying, can stimulate the release of milk from your milk-producing glands.  BENEFITS OF BREASTFEEDING For Your Baby  Your first milk (colostrum) helps your baby's digestive system function better.   There are antibodies in your milk that help your baby fight off infections.   Your baby has a lower incidence of asthma, allergies, and sudden infant death syndrome.   The nutrients in breast milk are better for your baby than infant formulas and are designed uniquely for your baby's needs.   Breast milk improves your baby's brain development.   Your baby is less likely to develop other conditions, such as childhood obesity, asthma, or type 2 diabetes mellitus.  For You   Breastfeeding helps to create a very special bond between you and your baby.   Breastfeeding is convenient. Breast milk is always available at the correct temperature and costs nothing.   Breastfeeding helps to burn calories and helps you lose the weight gained during pregnancy.   Breastfeeding makes your uterus contract to its prepregnancy size faster and slows bleeding (lochia) after you give birth.   Breastfeeding helps to lower your risk of developing type 2 diabetes mellitus, osteoporosis, and breast or ovarian cancer later in life. SIGNS THAT YOUR BABY IS HUNGRY Early Signs of Hunger  Increased alertness or activity.  Stretching.  Movement of the head from  side to side.  Movement of the head and opening of the mouth when the corner of the mouth or cheek is stroked (rooting).  Increased sucking sounds, smacking lips, cooing, sighing, or squeaking.  Hand-to-mouth movements.  Increased sucking of fingers or hands. Late Signs of Hunger  Fussing.  Intermittent crying. Extreme Signs of Hunger Signs of extreme hunger will require calming and consoling before your baby will be able to breastfeed successfully. Do not wait for the following signs of extreme hunger to occur before you initiate breastfeeding:   Restlessness.  A loud, strong cry.   Screaming. BREASTFEEDING BASICS Breastfeeding Initiation  Find a comfortable place to sit or lie down, with your neck and back well supported.  Place a pillow or rolled up blanket under your baby to bring him or her to the level of your breast (if you are seated). Nursing pillows are specially designed to help support your arms and your baby while you breastfeed.  Make sure that your baby's abdomen is facing your abdomen.   Gently massage your breast. With your fingertips, massage from your chest wall toward your nipple in a circular motion. This encourages milk flow. You may need to continue this action during the feeding if your milk flows slowly.  Support your breast with 4 fingers underneath and your thumb above your nipple. Make sure your fingers are well away from your nipple and your baby's mouth.   Stroke your baby's lips gently with your finger or nipple.   When your baby's mouth is open wide enough, quickly bring your baby to your   breast, placing your entire nipple and as much of the colored area around your nipple (areola) as possible into your baby's mouth.   More areola should be visible above your baby's upper lip than below the lower lip.   Your baby's tongue should be between his or her lower gum and your breast.   Ensure that your baby's mouth is correctly positioned  around your nipple (latched). Your baby's lips should create a seal on your breast and be turned out (everted).  It is common for your baby to suck about 2 3 minutes in order to start the flow of breast milk. Latching Teaching your baby how to latch on to your breast properly is very important. An improper latch can cause nipple pain and decreased milk supply for you and poor weight gain in your baby. Also, if your baby is not latched onto your nipple properly, he or she may swallow some air during feeding. This can make your baby fussy. Burping your baby when you switch breasts during the feeding can help to get rid of the air. However, teaching your baby to latch on properly is still the best way to prevent fussiness from swallowing air while breastfeeding. Signs that your baby has successfully latched on to your nipple:    Silent tugging or silent sucking, without causing you pain.   Swallowing heard between every 3 4 sucks.    Muscle movement above and in front of his or her ears while sucking.  Signs that your baby has not successfully latched on to nipple:   Sucking sounds or smacking sounds from your baby while breastfeeding.  Nipple pain. If you think your baby has not latched on correctly, slip your finger into the corner of your baby's mouth to break the suction and place it between your baby's gums. Attempt breastfeeding initiation again. Signs of Successful Breastfeeding Signs from your baby:   A gradual decrease in the number of sucks or complete cessation of sucking.   Falling asleep.   Relaxation of his or her body.   Retention of a small amount of milk in his or her mouth.   Letting go of your breast by himself or herself. Signs from you:  Breasts that have increased in firmness, weight, and size 1 3 hours after feeding.   Breasts that are softer immediately after breastfeeding.  Increased milk volume, as well as a change in milk consistency and color by  the 5th day of breastfeeding.   Nipples that are not sore, cracked, or bleeding. Signs That Your Baby is Getting Enough Milk  Wetting at least 3 diapers in a 24-hour period. The urine should be clear and pale yellow by age 5 days.  At least 3 stools in a 24-hour period by age 5 days. The stool should be soft and yellow.  At least 3 stools in a 24-hour period by age 7 days. The stool should be seedy and yellow.  No loss of weight greater than 10% of birth weight during the first 3 days of age.  Average weight gain of 4 7 ounces (120 210 mL) per week after age 4 days.  Consistent daily weight gain by age 5 days, without weight loss after the age of 2 weeks. After a feeding, your baby may spit up a small amount. This is common. BREASTFEEDING FREQUENCY AND DURATION Frequent feeding will help you make more milk and can prevent sore nipples and breast engorgement. Breastfeed when you feel the need to reduce   the fullness of your breasts or when your baby shows signs of hunger. This is called "breastfeeding on demand." Avoid introducing a pacifier to your baby while you are working to establish breastfeeding (the first 4 6 weeks after your baby is born). After this time you may choose to use a pacifier. Research has shown that pacifier use during the first year of a baby's life decreases the risk of sudden infant death syndrome (SIDS). Allow your baby to feed on each breast as long as he or she wants. Breastfeed until your baby is finished feeding. When your baby unlatches or falls asleep while feeding from the first breast, offer the second breast. Because newborns are often sleepy in the first few weeks of life, you may need to awaken your baby to get him or her to feed. Breastfeeding times will vary from baby to baby. However, the following rules can serve as a guide to help you ensure that your baby is properly fed:  Newborns (babies 4 weeks of age or younger) may breastfeed every 1 3  hours.  Newborns should not go longer than 3 hours during the day or 5 hours during the night without breastfeeding.  You should breastfeed your baby a minimum of 8 times in a 24-hour period until you begin to introduce solid foods to your baby at around 6 months of age. BREAST MILK PUMPING Pumping and storing breast milk allows you to ensure that your baby is exclusively fed your breast milk, even at times when you are unable to breastfeed. This is especially important if you are going back to work while you are still breastfeeding or when you are not able to be present during feedings. Your lactation consultant can give you guidelines on how long it is safe to store breast milk.  A breast pump is a machine that allows you to pump milk from your breast into a sterile bottle. The pumped breast milk can then be stored in a refrigerator or freezer. Some breast pumps are operated by hand, while others use electricity. Ask your lactation consultant which type will work best for you. Breast pumps can be purchased, but some hospitals and breastfeeding support groups lease breast pumps on a monthly basis. A lactation consultant can teach you how to hand express breast milk, if you prefer not to use a pump.  CARING FOR YOUR BREASTS WHILE YOU BREASTFEED Nipples can become dry, cracked, and sore while breastfeeding. The following recommendations can help keep your breasts moisturized and healthy:  Avoid using soap on your nipples.   Wear a supportive bra. Although not required, special nursing bras and tank tops are designed to allow access to your breasts for breastfeeding without taking off your entire bra or top. Avoid wearing underwire style bras or extremely tight bras.  Air dry your nipples for 3 4minutes after each feeding.   Use only cotton bra pads to absorb leaked breast milk. Leaking of breast milk between feedings is normal.   Use lanolin on your nipples after breastfeeding. Lanolin helps to  maintain your skin's normal moisture barrier. If you use pure lanolin you do not need to wash it off before feeding your baby again. Pure lanolin is not toxic to your baby. You may also hand express a few drops of breast milk and gently massage that milk into your nipples and allow the milk to air dry. In the first few weeks after giving birth, some women experience extremely full breasts (engorgement). Engorgement can make   your breasts feel heavy, warm, and tender to the touch. Engorgement peaks within 3 5 days after you give birth. The following recommendations can help ease engorgement:  Completely empty your breasts while breastfeeding or pumping. You may want to start by applying warm, moist heat (in the shower or with warm water-soaked hand towels) just before feeding or pumping. This increases circulation and helps the milk flow. If your baby does not completely empty your breasts while breastfeeding, pump any extra milk after he or she is finished.  Wear a snug bra (nursing or regular) or tank top for 1 2 days to signal your body to slightly decrease milk production.  Apply ice packs to your breasts, unless this is too uncomfortable for you.  Make sure that your baby is latched on and positioned properly while breastfeeding. If engorgement persists after 48 hours of following these recommendations, contact your health care provider or a lactation consultant. OVERALL HEALTH CARE RECOMMENDATIONS WHILE BREASTFEEDING  Eat healthy foods. Alternate between meals and snacks, eating 3 of each per day. Because what you eat affects your breast milk, some of the foods may make your baby more irritable than usual. Avoid eating these foods if you are sure that they are negatively affecting your baby.  Drink milk, fruit juice, and water to satisfy your thirst (about 10 glasses a day).   Rest often, relax, and continue to take your prenatal vitamins to prevent fatigue, stress, and anemia.  Continue  breast self-awareness checks.  Avoid chewing and smoking tobacco.  Avoid alcohol and drug use. Some medicines that may be harmful to your baby can pass through breast milk. It is important to ask your health care provider before taking any medicine, including all over-the-counter and prescription medicine as well as vitamin and herbal supplements. It is possible to become pregnant while breastfeeding. If birth control is desired, ask your health care provider about options that will be safe for your baby. SEEK MEDICAL CARE IF:   You feel like you want to stop breastfeeding or have become frustrated with breastfeeding.  You have painful breasts or nipples.  Your nipples are cracked or bleeding.  Your breasts are red, tender, or warm.  You have a swollen area on either breast.  You have a fever or chills.  You have nausea or vomiting.  You have drainage other than breast milk from your nipples.  Your breasts do not become full before feedings by the 5th day after you give birth.  You feel sad and depressed.  Your baby is too sleepy to eat well.  Your baby is having trouble sleeping.   Your baby is wetting less than 3 diapers in a 24-hour period.  Your baby has less than 3 stools in a 24-hour period.  Your baby's skin or the white part of his or her eyes becomes yellow.   Your baby is not gaining weight by 5 days of age. SEEK IMMEDIATE MEDICAL CARE IF:   Your baby is overly tired (lethargic) and does not want to wake up and feed.  Your baby develops an unexplained fever. Document Released: 05/26/2005 Document Revised: 01/26/2013 Document Reviewed: 11/17/2012 [employer] Patient Information 2014 [employer]. Vaginal Delivery During delivery, your health care provider will help you give birth to your baby. During a vaginal delivery, you will work to push the baby out of your vagina. However, before you can push your baby out, a few things need to happen. The opening of  your uterus (cervix) has   to soften, thin out, and open up (dilate) all the way to 10 cm. Also, your baby has to move down from the uterus into your vagina.  SIGNS OF LABOR  Your health care provider will first need to make sure you are in labor. Signs of labor include:   Passing what is called the mucous plug before labor begins. This is a small amount of blood-stained mucus.   Having regular, painful uterine contractions.   The time between contractions gets shorter.   The discomfort and pain gradually get more intense.  Contraction pains get worse when walking and do not go away when resting.   Your cervix becomes thinner (effacement) and dilates. BEFORE THE DELIVERY Once you are in labor and admitted into the hospital or care center, your health care provider may do the following:   Perform a complete physical exam.  Review any complications related to pregnancy or labor.  Check your blood pressure, pulse, temperature, and heart rate (vital signs).   Determine if, and when, the rupture of amniotic membranes occurred.  Do a vaginal exam (using a sterile glove and lubricant) to determine:   The position (presentation) of the baby. Is the baby's head presenting first (vertex) in the birth canal (vagina), or are the feet or buttocks first (breech)?   The level (station) of the baby's head within the birth canal.   The effacement and dilatation of the cervix.   An electronic fetal monitor is usually placed on your abdomen when you first arrive. This is used to monitor your contractions and the baby's heart rate.  When the monitor is on your abdomen (external fetal monitor), it can only pick up the frequency and length of your contractions. It cannot tell the strength of your contractions.  If it becomes necessary for your health care provider to know exactly how strong your contractions are or to see exactly what the baby's heart rate is doing, an internal monitor may be  inserted into your vagina and uterus. Your health care provider will discuss the benefits and risks of using an internal monitor and obtain your permission before inserting the device.  Continuous fetal monitoring may be needed if you have an epidural, are receiving certain medicines (such as oxytocin), or have pregnancy or labor complications.  An IV access tube may be placed into a vein in your arm to deliver fluids and medicines if necessary. THREE STAGES OF LABOR AND DELIVERY Normal labor and delivery is divided into three stages. First Stage This stage starts when you begin to contract regularly and your cervix begins to efface and dilate. It ends when your cervix is completely open (fully dilated). The first stage is the longest stage of labor and can last from 3 hours to 15 hours.  Several methods are available to help with labor pain. You and your health care provider will decide which option is best for you. Options include:   Opioid medicines. These are strong pain medicines that you can get through your IV tube or as a shot into your muscle. These medicines lessen pain but do not make it go away completely.  Epidural. A medicine is given through a thin tube that is inserted in your back. The medicine numbs the lower part of your body and prevents any pain in that area.  Paracervical pain medicine. This is an injection of an anesthetic on each side of your cervix.   You may request natural childbirth, which does not involve the use   of pain medicines or an epidural during labor and delivery. Instead, you will use other things, such as breathing exercises, to help cope with the pain. Second Stage The second stage of labor begins when your cervix is fully dilated at 10 cm. It continues until you push your baby down through the birth canal and the baby is born. This stage can take only minutes or several hours.  The location of your baby's head as it moves through the birth canal is  reported as a number called a station. If the baby's head has not started its descent, the station is described as being at minus 3 ( 3). When your baby's head is at the zero station, it is at the middle of the birth canal and is engaged in the pelvis. The station of your baby helps indicate the progress of the second stage of labor.  When your baby is born, your health care provider may hold the baby with his or her head lowered to prevent amniotic fluid, mucus, and blood from getting into the baby's lungs. The baby's mouth and nose may be suctioned with a small bulb syringe to remove any additional fluid.  Your health care provider may then place the baby on your stomach. It is important to keep the baby from getting cold. To do this, the health care provider will dry the baby off, place the baby directly on your skin (with no blankets between you and the baby), and cover the baby with warm, dry blankets.   The umbilical cord is cut. Third Stage During the third stage of labor, your health care provider will deliver the placenta (afterbirth) and make sure your bleeding is under control. The delivery of the placenta usually takes about 5 minutes but can take up to 30 minutes. After the placenta is delivered, a medicine may be given either by IV or injection to help contract the uterus and control bleeding. If you are planning to breastfeed, you can try to do so now. After you deliver the placenta, your uterus should contract and get very firm. If your uterus does not remain firm, your health care provider will massage it. This is important because the contraction of the uterus helps cut off bleeding at the site where the placenta was attached to your uterus. If your uterus does not contract properly and stay firm, you may continue to bleed heavily. If there is a lot of bleeding, medicines may be given to contract the uterus and stop the bleeding.  Document Released: 03/04/2008 Document Revised:  01/26/2013 Document Reviewed: 11/14/2012 [employer] Patient Information 2014 [employer].  

## 2013-10-17 NOTE — Progress Notes (Signed)
Post discharge ur review completed. 

## 2013-11-24 ENCOUNTER — Ambulatory Visit: Payer: Medicaid Other | Admitting: Obstetrics & Gynecology

## 2013-12-06 ENCOUNTER — Encounter (HOSPITAL_COMMUNITY): Payer: Self-pay | Admitting: Emergency Medicine

## 2013-12-06 ENCOUNTER — Emergency Department (HOSPITAL_COMMUNITY)
Admission: EM | Admit: 2013-12-06 | Discharge: 2013-12-06 | Disposition: A | Discharge status: Home / Self Care | Payer: Medicaid Other | Attending: Emergency Medicine | Admitting: Emergency Medicine

## 2013-12-06 DIAGNOSIS — Z91040 Latex allergy status: Secondary | ICD-10-CM | POA: Insufficient documentation

## 2013-12-06 DIAGNOSIS — Z79899 Other long term (current) drug therapy: Secondary | ICD-10-CM | POA: Insufficient documentation

## 2013-12-06 DIAGNOSIS — M545 Low back pain, unspecified: Secondary | ICD-10-CM | POA: Insufficient documentation

## 2013-12-06 DIAGNOSIS — J029 Acute pharyngitis, unspecified: Secondary | ICD-10-CM | POA: Insufficient documentation

## 2013-12-06 DIAGNOSIS — J45909 Unspecified asthma, uncomplicated: Secondary | ICD-10-CM | POA: Insufficient documentation

## 2013-12-06 DIAGNOSIS — Z87440 Personal history of urinary (tract) infections: Secondary | ICD-10-CM | POA: Insufficient documentation

## 2013-12-06 DIAGNOSIS — Z87891 Personal history of nicotine dependence: Secondary | ICD-10-CM | POA: Insufficient documentation

## 2013-12-06 MED ORDER — METHOCARBAMOL 500 MG PO TABS
1000.0000 mg | ORAL_TABLET | Freq: Once | ORAL | Status: AC
  Administered 2013-12-06: 1000 mg via ORAL
  Filled 2013-12-06: qty 2

## 2013-12-06 MED ORDER — METHOCARBAMOL 500 MG PO TABS: 1000.0000 mg | ORAL_TABLET | Freq: Four times a day (QID) | ORAL | Status: DC | PRN

## 2013-12-06 NOTE — ED Provider Notes (Signed)
CSN: [id number]     Arrival date & time 12/06/13  1910 History  This chart was scribed for Wynetta EmeryNicole Pisciotta, PA-C working with  Rolland PorterMark James, MD by Evon Slackerrance Branch, ED Scribe. This patient was seen in room TR07C/TR07C and the patient's care was started at 7:36 PM.     Chief Complaint  Patient presents with  . Back Pain  . Sore Throat   Patient is a 23 y.o. female presenting with back pain and pharyngitis. The history is provided by the patient. No language interpreter was used.  Back Pain Associated symptoms: no fever and no headaches   Sore Throat Pertinent negatives include no headaches.  Sore Throat Associated symptoms include a sore throat. Pertinent negatives include no fever or headaches.   HPI Comments: Cheyenne Walton is a 23 y.o. female who presents to the Emergency Department complaining of back pain onset 2 months prior. She states she gave birth 2 months ago and she had an epidural. She states they discovered she had scoliosis during child birth. She states the pain radiates up into her arms and shoulders. She states she has associated headache and sore throat. She states she took a muscle relaxer and Advil with no relief 1 hour prior to arrival. Denies fever, chills, change in bowel or bladder habits, h/o IDVU or cancer, numbness or weakness.    Past Medical History  Diagnosis Date  . UTI (urinary tract infection)   . Ectopic pregnancy   . Asthma     "haven't used an inhaler in years"   Past Surgical History  Procedure Laterality Date  . Laparoscopy  09/13/2011    Procedure: LAPAROSCOPY OPERATIVE;  Surgeon: Tereso NewcomerUgonna A Anyanwu, MD;  Location: [facility];  Service: Gynecology;  Laterality: Left;  operative laparoscopy left salpingectomy with removal of ectopic pregnancy.  . Ectopic pregnancy surgery    . Laparoscopic unilateral salpingectomy      LEFT SIDE with ectopic pregnancy   Family History  Problem Relation Age of Onset  . Anesthesia problems Neg Hx   . Hypotension Neg Hx    . Malignant hyperthermia Neg Hx   . Pseudochol deficiency Neg Hx   . Cancer Maternal Grandmother    History  Substance Use Topics  . Smoking status: Former Smoker -- 1.00 packs/day    Quit date: 04/08/2012  . Smokeless tobacco: Never Used  . Alcohol Use: No   OB History   Grav Para Term Preterm Abortions TAB SAB Ect Mult Living   4 1 1  0 3 0 2 1 0 1     Review of Systems  Constitutional: Negative for fever.  HENT: Positive for sore throat.   Musculoskeletal: Positive for back pain.  Neurological: Negative for headaches.   A complete 10 system review of systems was obtained and all systems are negative except as noted in the HPI and PMH.   Allergies  Contrast media; Other; and Latex  Home Medications   Prior to Admission medications   Medication Sig Start Date End Date Taking? Authorizing Conroy Goracke  folic acid (FOLVITE) 1 MG tablet Take 1 tablet (1 mg total) by mouth daily. 03/10/13   Lisa A Leftwich-Kirby, CNM  ibuprofen (ADVIL,MOTRIN) 600 MG tablet Take 1 tablet (600 mg total) by mouth every 6 (six) hours. 10/15/13   Myriam Jacobsonobyn H Restrepo, MD  Prenatal Vit-Fe Fumarate-FA (PRENATAL VITAMINS) 28-0.8 MG TABS Take 1 tablet by mouth daily. 03/10/13   Wilmer FloorLisa A Leftwich-Kirby, CNM   Triage Vitals: BP 114/72  Pulse 99  Temp(Src) 98.1 F (36.7 C) (Oral)  Resp 18  Ht 5\' 4"  (1.626 m)  Wt 238 lb (107.956 kg)  BMI 40.83 kg/m2  SpO2 95%  Physical Exam  Nursing note and vitals reviewed. Constitutional: She is oriented to person, place, and time. She appears well-developed and well-nourished. No distress.  HENT:  Head: Normocephalic and atraumatic.  Mouth/Throat: Oropharynx is clear and moist.  No drooling or stridor. Posterior pharynx mildly erythematous no significant tonsillar hypertrophy. No exudate. Soft palate rises symmetrically. No TTP or induration under tongue.   No tenderness to palpation of frontal or bilateral maxillary sinuses.  No mucosal edema in the nares.  Bilateral  tympanic membranes with normal architecture and good light reflex.    Eyes: Conjunctivae and EOM are normal.  Neck: Normal range of motion. Neck supple. No tracheal deviation present.  Cardiovascular: Normal rate.   Pulmonary/Chest: Effort normal and breath sounds normal. No respiratory distress. She has no wheezes. She has no rales. She exhibits no tenderness.  Abdominal: Soft. There is no tenderness.  Musculoskeletal: Normal range of motion. She exhibits tenderness. She exhibits no edema.       Back:  Diffusely exquisitely tender to palpation at all areas of the lumbar region. No point tenderness over lumbar spinal processes. No muscle spasm appreciated. Extensor hallux longus is 5 out of 5 bilaterally, deep tendon reflexes 2+ bilaterally. Distal sensation is grossly intact  Lymphadenopathy:    She has no cervical adenopathy.  Neurological: She is alert and oriented to person, place, and time.  Skin: Skin is warm and dry.  Psychiatric: She has a normal mood and affect. Her behavior is normal.    ED Course  Procedures (including critical care time) DIAGNOSTIC STUDIES: Oxygen Saturation is 95% on RA, adequate by my interpretation.    COORDINATION OF CARE:    Labs Review Labs Reviewed - No data to display  Imaging Review No results found.   EKG Interpretation None      MDM   Final diagnoses:  None   Filed Vitals:   12/06/13 1921  BP: 114/72  Pulse: 99  Temp: 98.1 F (36.7 C)  TempSrc: Oral  Resp: 18  Height: 5\' 4"  (1.626 m)  Weight: 238 lb (107.956 kg)  SpO2: 95%    Medications  methocarbamol (ROBAXIN) tablet 1,000 mg (1,000 mg Oral Given 12/06/13 1953)    Cheyenne Walton is a 23 y.o. female presenting with  back pain.  No neurological deficits and normal neuro exam.  Patient can walk but states is painful.  No loss of bowel or bladder control.  No concern for cauda equina.  No fever, night sweats, weight loss, h/o cancer, IVDU.  RICE protocol and pain  medicine indicated and discussed with patient.  Evaluation does not show pathology that would require ongoing emergent intervention or inpatient treatment. Pt is hemodynamically stable and mentating appropriately. Discussed findings and plan with patient/guardian, who agrees with care plan. All questions answered. Return precautions discussed and outpatient follow up given.   Discharge Medication List as of 12/06/2013  7:49 PM    START taking these medications   Details  methocarbamol (ROBAXIN) 500 MG tablet Take 2 tablets (1,000 mg total) by mouth 4 (four) times daily as needed (Pain)., Starting 12/06/2013, Until Discontinued, Print             I personally performed the services described in this documentation, which was scribed in my presence. The recorded information has been reviewed and is accurate.  Wynetta Emeryicole Pisciotta, PA-C 12/07/13 [phone]

## 2013-12-06 NOTE — Discharge Instructions (Signed)
For pain control you may take up to 800mg  of Motrin (also known as ibuprofen). That is usually 4 over the counter pills,  3 times a day. Take with food to minimize stomach irritation   You can also take  tylenol (acetaminophen) 975mg  (this is 3 over the counter pills) four times a day. Do not drink alcohol or combine with other medications that have acetaminophen as an ingredient (Read the labels!).    For breakthrough pain you may take Robaxin. Do not drink alcohol, drive or operate heavy machinery when taking Robaxin.  Please follow with your primary care doctor in the next 2 days for a check-up. They must obtain records for further management.   Do not hesitate to return to the Emergency Department for any new, worsening or concerning symptoms.    Back Pain, Adult Low back pain is very common. About 1 in 5 people have back pain.The cause of low back pain is rarely dangerous. The pain often gets better over time.About half of people with a sudden onset of back pain feel better in just 2 weeks. About 8 in 10 people feel better by 6 weeks.  CAUSES Some common causes of back pain include:  Strain of the muscles or ligaments supporting the spine.  Wear and tear (degeneration) of the spinal discs.  Arthritis.  Direct injury to the back. DIAGNOSIS Most of the time, the direct cause of low back pain is not known.However, back pain can be treated effectively even when the exact cause of the pain is unknown.Answering your caregiver's questions about your overall health and symptoms is one of the most accurate ways to make sure the cause of your pain is not dangerous. If your caregiver needs more information, he or she may order lab work or imaging tests (X-rays or MRIs).However, even if imaging tests show changes in your back, this usually does not require surgery. HOME CARE INSTRUCTIONS For many people, back pain returns.Since low back pain is rarely dangerous, it is often a condition that  people can learn to [facility] Cent[facility]ageon their own.   Remain active. It is stressful on the back to sit or stand in one place. Do not sit, drive, or stand in one place for more than 30 minutes at a time. Take short walks on level surfaces as soon as pain allows.Try to increase the length of time you walk each day.  Do not stay in bed.Resting more than 1 or 2 days can delay your recovery.  Do not avoid exercise or work.Your body is made to move.It is not dangerous to be active, even though your back may hurt.Your back will likely heal faster if you return to being active before your pain is gone.  Pay attention to your body when you bend and lift. Many people have less discomfortwhen lifting if they bend their knees, keep the load close to their bodies,and avoid twisting. Often, the most comfortable positions are those that put less stress on your recovering back.  Find a comfortable position to sleep. Use a firm mattress and lie on your side with your knees slightly bent. If you lie on your back, put a pillow under your knees.  Only take over-the-counter or prescription medicines as directed by your caregiver. Over-the-counter medicines to reduce pain and inflammation are often the most helpful.Your caregiver may prescribe muscle relaxant drugs.These medicines help dull your pain so you can more quickly return to your normal activities and healthy exercise.  Put ice on the injured area.  Put ice in a plastic bag.  Place a towel between your skin and the bag.  Leave the ice on for 15-20 minutes, 03-04 times a day for the first 2 to 3 days. After that, ice and heat may be alternated to reduce pain and spasms.  Ask your caregiver about trying back exercises and gentle massage. This may be of some benefit.  Avoid feeling anxious or stressed.Stress increases muscle tension and can worsen back pain.It is important to recognize when you are anxious or stressed and learn ways to manage it.Exercise  is a great option. SEEK MEDICAL CARE IF:  You have pain that is not relieved with rest or medicine.  You have pain that does not improve in 1 week.  You have new symptoms.  You are generally not feeling well. SEEK IMMEDIATE MEDICAL CARE IF:   You have pain that radiates from your back into your legs.  You develop new bowel or bladder control problems.  You have unusual weakness or numbness in your arms or legs.  You develop nausea or vomiting.  You develop abdominal pain.  You feel faint. Document Released: 05/26/2005 Document Revised: 11/25/2011 Document Reviewed: 10/14/2010 [facility] Patient Information 2015 [employer] [place]. This information is not intended to replace advice given to you by your health care provider. Make sure you discuss any questions you have with your health care provider.

## 2013-12-06 NOTE — ED Notes (Addendum)
Presents with lower back pain that shoots upward and downward began after epidural on May 7th from delivering baby.  Pain is described as shooting and shoots into left leg. Pt also c/o headache and sore throat. dneis fevers and chills. Throat pain is made worse when swallowing.  Alert and oriented. No redness or exudate to throat.  Back pain is intermittient.

## 2013-12-12 NOTE — ED Provider Notes (Signed)
Medical screening examination/treatment/procedure(s) were performed by non-physician practitioner and as supervising physician I was immediately available for consultation/collaboration.   EKG Interpretation None        Mark James, MD 12/12/13 2350 

## 2013-12-22 ENCOUNTER — Ambulatory Visit (INDEPENDENT_AMBULATORY_CARE_PROVIDER_SITE_OTHER): Payer: Medicaid Other | Admitting: Obstetrics & Gynecology

## 2013-12-22 ENCOUNTER — Encounter: Payer: Self-pay | Admitting: Obstetrics & Gynecology

## 2013-12-22 LAB — POCT PREGNANCY, URINE: Preg Test, Ur: NEGATIVE

## 2013-12-22 NOTE — Progress Notes (Signed)
Patient ID: Cheyenne Walton, female   DOB: Nov 16, 1990, 23 y.o.   MRN: [id number] Subjective:     Cheyenne Walton is a 23 y.o. female who presents for a postpartum visit. She is >10 weeks   postpartum following a vacuum assisted vaginal delivery. I have fully reviewed the prenatal and intrapartum course. The delivery was at term. Outcome: vacuum, outlet. Anesthesia: epidural. Postpartum course has been uncomlicated. Baby's course has been uncomplicated. Baby is feeding by bottle. Bleeding no bleeding. Bowel function is normal. Bladder function is normal. Patient is sexually active. Contraception method is none. Postpartum depression screening: negative.  The following portions of the patient's history were reviewed and updated as appropriate: allergies, current medications, past family history, past medical history, past social history, past surgical history and problem list.  Review of Systems A comprehensive review of systems was negative.   Objective:    BP 104/71  Pulse 69  Temp(Src) 98.3 F (36.8 C) (Oral)  Ht 5\' 4"  (1.626 m)  Wt 219 lb 9.6 oz (99.61 kg)  BMI 37.68 kg/m2  Breastfeeding? No  General:  alert and no distress           Abdomen: soft, non-tender; bowel sounds normal; no masses,  no organomegaly   Vulva:  normal  Vagina: normal vagina, no discharge, exudate, lesion, or erythema                    Assessment:    10 weeks postpartum exam. Pap smear not done at today's visit.  Contraception management- pt with multiple episodes or unprotected intercourse  Plan:    1. Contraception: none 2. Desires Mirena but had unprotected intercourse 3. Follow up in: 2 weeks or as needed.

## 2013-12-22 NOTE — Patient Instructions (Signed)
Levonorgestrel intrauterine device (IUD) What is this medicine? LEVONORGESTREL IUD (LEE voe nor jes trel) is a contraceptive (birth control) device. The device is placed inside the uterus by a healthcare professional. It is used to prevent pregnancy and can also be used to treat heavy bleeding that occurs during your period. Depending on the device, it can be used for 3 to 5 years. This medicine may be used for other purposes; ask your health care provider or pharmacist if you have questions. COMMON BRAND NAME(S): Mirena, Skyla What should I tell my health care provider before I take this medicine? They need to know if you have any of these conditions: -abnormal Pap smear -cancer of the breast, uterus, or cervix -diabetes -endometritis -genital or pelvic infection now or in the past -have more than one sexual partner or your partner has more than one partner -heart disease -history of an ectopic or tubal pregnancy -immune system problems -IUD in place -liver disease or tumor -problems with blood clots or take blood-thinners -use intravenous drugs -uterus of unusual shape -vaginal bleeding that has not been explained -an unusual or allergic reaction to levonorgestrel, other hormones, silicone, or polyethylene, medicines, foods, dyes, or preservatives -pregnant or trying to get pregnant -breast-feeding How should I use this medicine? This device is placed inside the uterus by a health care professional. Talk to your pediatrician regarding the use of this medicine in children. Special care may be needed. Overdosage: If you think you have taken too much of this medicine contact a poison control center or emergency room at once. NOTE: This medicine is only for you. Do not share this medicine with others. What if I miss a dose? This does not apply. What may interact with this medicine? Do not take this medicine with any of the following  medications: -amprenavir -bosentan -fosamprenavir This medicine may also interact with the following medications: -aprepitant -barbiturate medicines for inducing sleep or treating seizures -bexarotene -griseofulvin -medicines to treat seizures like carbamazepine, ethotoin, felbamate, oxcarbazepine, phenytoin, topiramate -modafinil -pioglitazone -rifabutin -rifampin -rifapentine -some medicines to treat HIV infection like atazanavir, indinavir, lopinavir, nelfinavir, tipranavir, ritonavir -St. John's wort -warfarin This list may not describe all possible interactions. Give your health care provider a list of all the medicines, herbs, non-prescription drugs, or dietary supplements you use. Also tell them if you smoke, drink alcohol, or use illegal drugs. Some items may interact with your medicine. What should I watch for while using this medicine? Visit your doctor or health care professional for regular check ups. See your doctor if you or your partner has sexual contact with others, becomes HIV positive, or gets a sexual transmitted disease. This product does not protect you against HIV infection (AIDS) or other sexually transmitted diseases. You can check the placement of the IUD yourself by reaching up to the top of your vagina with clean fingers to feel the threads. Do not pull on the threads. It is a good habit to check placement after each menstrual period. Call your doctor right away if you feel more of the IUD than just the threads or if you cannot feel the threads at all. The IUD may come out by itself. You may become pregnant if the device comes out. If you notice that the IUD has come out use a backup birth control method like condoms and call your health care provider. Using tampons will not change the position of the IUD and are okay to use during your period. What side effects may I   notice from receiving this medicine? Side effects that you should report to your doctor or  health care professional as soon as possible: -allergic reactions like skin rash, itching or hives, swelling of the face, lips, or tongue -fever, flu-like symptoms -genital sores -high blood pressure -no menstrual period for 6 weeks during use -pain, swelling, warmth in the leg -pelvic pain or tenderness -severe or sudden headache -signs of pregnancy -stomach cramping -sudden shortness of breath -trouble with balance, talking, or walking -unusual vaginal bleeding, discharge -yellowing of the eyes or skin Side effects that usually do not require medical attention (report to your doctor or health care professional if they continue or are bothersome): -acne -breast pain -change in sex drive or performance -changes in weight -cramping, dizziness, or faintness while the device is being inserted -headache -irregular menstrual bleeding within first 3 to 6 months of use -nausea This list may not describe all possible side effects. Call your doctor for medical advice about side effects. You may report side effects to FDA at 1-800-FDA-1088. Where should I keep my medicine? This does not apply. NOTE: This sheet is a summary. It may not cover all possible information. If you have questions about this medicine, talk to your doctor, pharmacist, or health care provider.  2015, [employer]. (2011-06-26 13:54:04)  

## 2013-12-22 NOTE — Progress Notes (Signed)
Patient here today for PP. Reports having unprotected sex last Thursday-- advised to have protected sex or abstain from sex for the next two weeks and then she can return for birth control.

## 2014-01-13 ENCOUNTER — Telehealth: Payer: Self-pay | Admitting: General Practice

## 2014-01-13 ENCOUNTER — Encounter: Payer: Self-pay | Admitting: General Practice

## 2014-01-13 ENCOUNTER — Ambulatory Visit: Payer: Medicaid Other | Admitting: Obstetrics & Gynecology

## 2014-01-13 NOTE — Telephone Encounter (Signed)
Patient no showed for appt today. Called patient no answer- left message stating we are trying to reach you because it appears you missed an appt with us today. Please contact our front office staff to get this appt rescheduled. Will send letter

## 2014-02-28 ENCOUNTER — Emergency Department (HOSPITAL_COMMUNITY)
Admission: EM | Admit: 2014-02-28 | Discharge: 2014-02-28 | Disposition: A | Discharge status: Home / Self Care | Payer: Medicaid Other | Attending: Emergency Medicine | Admitting: Emergency Medicine

## 2014-02-28 ENCOUNTER — Encounter (HOSPITAL_COMMUNITY): Payer: Self-pay | Admitting: Emergency Medicine

## 2014-02-28 ENCOUNTER — Emergency Department (HOSPITAL_COMMUNITY): Payer: Medicaid Other

## 2014-02-28 DIAGNOSIS — J45901 Unspecified asthma with (acute) exacerbation: Secondary | ICD-10-CM | POA: Diagnosis not present

## 2014-02-28 DIAGNOSIS — R059 Cough, unspecified: Secondary | ICD-10-CM | POA: Insufficient documentation

## 2014-02-28 DIAGNOSIS — Z87891 Personal history of nicotine dependence: Secondary | ICD-10-CM | POA: Diagnosis not present

## 2014-02-28 DIAGNOSIS — R05 Cough: Secondary | ICD-10-CM | POA: Insufficient documentation

## 2014-02-28 DIAGNOSIS — Z79899 Other long term (current) drug therapy: Secondary | ICD-10-CM | POA: Diagnosis not present

## 2014-02-28 DIAGNOSIS — J40 Bronchitis, not specified as acute or chronic: Secondary | ICD-10-CM

## 2014-02-28 DIAGNOSIS — Z87440 Personal history of urinary (tract) infections: Secondary | ICD-10-CM | POA: Insufficient documentation

## 2014-02-28 MED ORDER — AZITHROMYCIN 250 MG PO TABS
500.0000 mg | ORAL_TABLET | Freq: Once | ORAL | Status: AC
  Administered 2014-02-28: 500 mg via ORAL
  Filled 2014-02-28: qty 2

## 2014-02-28 MED ORDER — ALBUTEROL SULFATE HFA 108 (90 BASE) MCG/ACT IN AERS
2.0000 | INHALATION_SPRAY | RESPIRATORY_TRACT | Status: DC | PRN
Start: 2014-02-28 — End: 2014-03-01
  Administered 2014-02-28: 2 via RESPIRATORY_TRACT
  Filled 2014-02-28: qty 6.7

## 2014-02-28 MED ORDER — GUAIFENESIN-CODEINE 100-10 MG/5ML PO SOLN: 5.0000 mL | Freq: Three times a day (TID) | ORAL | Status: DC | PRN

## 2014-02-28 MED ORDER — AZITHROMYCIN 250 MG PO TABS: 250.0000 mg | ORAL_TABLET | Freq: Every day | ORAL | Status: DC

## 2014-02-28 MED ORDER — ALBUTEROL SULFATE (2.5 MG/3ML) 0.083% IN NEBU
5.0000 mg | INHALATION_SOLUTION | Freq: Once | RESPIRATORY_TRACT | Status: AC
  Administered 2014-02-28: 5 mg via RESPIRATORY_TRACT
  Filled 2014-02-28: qty 6

## 2014-02-28 NOTE — Discharge Instructions (Signed)
Upper Respiratory Infection, Adult An upper respiratory infection (URI) is also sometimes known as the common cold. The upper respiratory tract includes the nose, sinuses, throat, trachea, and bronchi. Bronchi are the airways leading to the lungs. Most people improve within 1 week, but symptoms can last up to 2 weeks. A residual cough may last even longer.  CAUSES Many different viruses can infect the tissues lining the upper respiratory tract. The tissues become irritated and inflamed and often become very moist. Mucus production is also common. A cold is contagious. You can easily spread the virus to others by oral contact. This includes kissing, sharing a glass, coughing, or sneezing. Touching your mouth or nose and then touching a surface, which is then touched by another person, can also spread the virus. SYMPTOMS  Symptoms typically develop 1 to 3 days after you come in contact with a cold virus. Symptoms vary from person to person. They may include:  Runny nose.  Sneezing.  Nasal congestion.  Sinus irritation.  Sore throat.  Loss of voice (laryngitis).  Cough.  Fatigue.  Muscle aches.  Loss of appetite.  Headache.  Low-grade fever. DIAGNOSIS  You might diagnose your own cold based on familiar symptoms, since most people get a cold 2 to 3 times a year. Your caregiver can confirm this based on your exam. Most importantly, your caregiver can check that your symptoms are not due to another disease such as strep throat, sinusitis, pneumonia, asthma, or epiglottitis. Blood tests, throat tests, and X-rays are not necessary to diagnose a common cold, but they may sometimes be helpful in excluding other more serious diseases. Your caregiver will decide if any further tests are required. RISKS AND COMPLICATIONS  You may be at risk for a more severe case of the common cold if you smoke cigarettes, have chronic heart disease (such as heart failure) or lung disease (such as asthma), or if  you have a weakened immune system. The very young and very old are also at risk for more serious infections. Bacterial sinusitis, middle ear infections, and bacterial pneumonia can complicate the common cold. The common cold can worsen asthma and chronic obstructive pulmonary disease (COPD). Sometimes, these complications can require emergency medical care and may be life-threatening. PREVENTION  The best way to protect against getting a cold is to practice good hygiene. Avoid oral or hand contact with people with cold symptoms. Wash your hands often if contact occurs. There is no clear evidence that vitamin C, vitamin E, echinacea, or exercise reduces the chance of developing a cold. However, it is always recommended to get plenty of rest and practice good nutrition. TREATMENT  Treatment is directed at relieving symptoms. There is no cure. Antibiotics are not effective, because the infection is caused by a virus, not by bacteria. Treatment may include:  Increased fluid intake. Sports drinks offer valuable electrolytes, sugars, and fluids.  Breathing heated mist or steam (vaporizer or shower).  Eating chicken soup or other clear broths, and maintaining good nutrition.  Getting plenty of rest.  Using gargles or lozenges for comfort.  Controlling fevers with ibuprofen or acetaminophen as directed by your caregiver.  Increasing usage of your inhaler if you have asthma. Zinc gel and zinc lozenges, taken in the first 24 hours of the common cold, can shorten the duration and lessen the severity of symptoms. Pain medicines may help with fever, muscle aches, and throat pain. A variety of non-prescription medicines are available to treat congestion and runny nose. Your caregiver   can make recommendations and may suggest nasal or lung inhalers for other symptoms.  HOME CARE INSTRUCTIONS   Only take over-the-counter or prescription medicines for pain, discomfort, or fever as directed by your  caregiver.  Use a warm mist humidifier or inhale steam from a shower to increase air moisture. This may keep secretions moist and make it easier to breathe.  Drink enough water and fluids to keep your urine clear or pale yellow.  Rest as needed.  Return to work when your temperature has returned to normal or as your caregiver advises. You may need to stay home longer to avoid infecting others. You can also use a face mask and careful hand washing to prevent spread of the virus. SEEK MEDICAL CARE IF:   After the first few days, you feel you are getting worse rather than better.  You need your caregiver's advice about medicines to control symptoms.  You develop chills, worsening shortness of breath, or brown or red sputum. These may be signs of pneumonia.  You develop yellow or brown nasal discharge or pain in the face, especially when you bend forward. These may be signs of sinusitis.  You develop a fever, swollen neck glands, pain with swallowing, or white areas in the back of your throat. These may be signs of strep throat. SEEK IMMEDIATE MEDICAL CARE IF:   You have a fever.  You develop severe or persistent headache, ear pain, sinus pain, or chest pain.  You develop wheezing, a prolonged cough, cough up blood, or have a change in your usual mucus (if you have chronic lung disease).  You develop sore muscles or a stiff neck. Document Released: 11/19/2000 Document Revised: 08/18/2011 Document Reviewed: 08/31/2013 [employer] Patient Information 2015 [employer]. This information is not intended to replace advice given to you by your health care provider. Make sure you discuss any questions you have with your health care provider.  

## 2014-02-28 NOTE — ED Notes (Addendum)
Cough x 4 days; nasal congestion; unable to describe what mucous looks like because she says she swallows it. Denies fevers. Chest pain when severely coughing under her ribs. Reports wheezing at home. Lungs clear today.

## 2014-02-28 NOTE — ED Provider Notes (Signed)
CSN: [id number]     Arrival date & time 02/28/14  2051 History  This chart was scribed for non-physician practitioner, Marlon Pel, PA-C working with Geoffery Lyons, MD by Luisa Dago, ED scribe. This patient was seen in room TR10C/TR10C and the patient's care was started at 11:36 PM.     Chief Complaint  Patient presents with  . Cough   The history is provided by the patient. No language interpreter was used.  HPI Comments: Cheyenne Walton is a 23 y.o. female who presents to the Emergency Department complaining of a worsening dry cough that started 4 days ago. Pt states that for the past couple of days she has been experiencing chest tightness, chills, congestion, headaches, and dry throat. She reports taking OTC pain medication, Theraflu, and Mucinex.Has a 4 mo baby that she has staying with her mother. Denies any chest pain, nausea, emesis, fever, chills, SOB, or sore throat. Pt is a daily smoker.   Past Medical History  Diagnosis Date  . UTI (urinary tract infection)   . Ectopic pregnancy   . Asthma     "haven't used an inhaler in years"   Past Surgical History  Procedure Laterality Date  . Laparoscopy  09/13/2011    Procedure: LAPAROSCOPY OPERATIVE;  Surgeon: Tereso Newcomer, MD;  Location: [facility];  Service: Gynecology;  Laterality: Left;  operative laparoscopy left salpingectomy with removal of ectopic pregnancy.  . Ectopic pregnancy surgery    . Laparoscopic unilateral salpingectomy      LEFT SIDE with ectopic pregnancy   Family History  Problem Relation Age of Onset  . Anesthesia problems Neg Hx   . Hypotension Neg Hx   . Malignant hyperthermia Neg Hx   . Pseudochol deficiency Neg Hx   . Cancer Maternal Grandmother    History  Substance Use Topics  . Smoking status: Former Smoker -- 1.00 packs/day    Quit date: 04/08/2012  . Smokeless tobacco: Never Used  . Alcohol Use: No   OB History   Grav Para Term Preterm Abortions TAB SAB Ect Mult Living   0 3 0 2 1 0  1     Review of Systems  Constitutional: Negative for fever and chills.  HENT: Positive for congestion. Negative for ear pain, rhinorrhea and sore throat.   Eyes: Negative for visual disturbance.  Respiratory: Positive for cough, chest tightness and wheezing.   Cardiovascular: Negative for chest pain.  Gastrointestinal: Negative for nausea, vomiting and abdominal pain.  Genitourinary: Negative for dysuria.  Neurological: Positive for headaches.  All other systems reviewed and are negative.  Allergies  Contrast media; Other; and Latex  Home Medications   Prior to Admission medications   Medication Sig Start Date End Date Taking? Authorizing Provider  azithromycin (ZITHROMAX) 250 MG tablet Take 1 tablet (250 mg total) by mouth daily. 1 tab every day until finished. 03/01/14   Shelby Anderle Irine Seal, PA-C  folic acid (FOLVITE) 1 MG tablet Take 1 tablet (1 mg total) by mouth daily. 03/10/13   Lisa A Leftwich-Kirby, CNM  guaiFENesin-codeine 100-10 MG/5ML syrup Take 5-10 mLs by mouth 3 (three) times daily as needed for cough. 02/28/14   Blanca Carreon Irine Seal, PA-C  ibuprofen (ADVIL,MOTRIN) 600 MG tablet Take 1 tablet (600 mg total) by mouth every 6 (six) hours. 10/15/13   Myriam Jacobson, MD  methocarbamol (ROBAXIN) 500 MG tablet Take 2 tablets (1,000 mg total) by mouth 4 (four) times daily as needed (Pain). 12/06/13   Joni Reining  Pisciotta, PA-C  Prenatal Vit-Fe Fumarate-FA (PRENATAL VITAMINS) 28-0.8 MG TABS Take 1 tablet by mouth daily. 03/10/13   Wilmer Floor Leftwich-Kirby, CNM   Triage vitals:BP 106/71  Pulse 88  Temp(Src) 99.3 F (37.4 C) (Oral)  Resp 18  Ht  (1.626 m)  Wt 226 lb (102.513 kg)  BMI 38.77 kg/m2  SpO2 97%  Physical Exam  Nursing note and vitals reviewed. Constitutional: She is oriented to person, place, and time. She appears well-developed and well-nourished. No distress.  HENT:  Head: Normocephalic and atraumatic.  Mouth/Throat: Oropharyngeal exudate present. No posterior  oropharyngeal erythema.  Exudates to bilateral tonsils.   Eyes: Conjunctivae and EOM are normal. Pupils are equal, round, and reactive to light.  Neck: Normal range of motion. Neck supple.  Cardiovascular: Normal rate.   Pulmonary/Chest: Effort normal. No respiratory distress.  Musculoskeletal: Normal range of motion.  Neurological: She is alert and oriented to person, place, and time.  Skin: Skin is warm and dry.  Psychiatric: She has a normal mood and affect. Her behavior is normal.    ED Course  Procedures (including critical care time)  DIAGNOSTIC STUDIES: Oxygen Saturation is 97% on RA, adequate by my interpretation.    COORDINATION OF CARE: 11:39 PM- pt was given a breathing treatment which she states helped open her up allowing her to breathe better. Will prescribe antibiotics and an albuterol inhaler. Will also give pt a 2 day work note per pt's request. Pt advised of plan for treatment and pt agrees.  Imaging Review Dg Chest 2 View  02/28/2014   CLINICAL DATA:  Cough congestion for 4 days, history of asthma  EXAM: CHEST  2 VIEW  COMPARISON:  None.  FINDINGS: The heart size and mediastinal contours are within normal limits. Both lungs are clear. The visualized skeletal structures are unremarkable.  IMPRESSION: No active cardiopulmonary disease.   Electronically Signed   By: Esperanza Heir M.D.   On: 02/28/2014 22:44    MDM   Final diagnoses:  Bronchitis    Medications  albuterol (PROVENTIL HFA;VENTOLIN HFA) 108 (90 BASE) MCG/ACT inhaler 2 puff (not administered)  azithromycin (ZITHROMAX) tablet 500 mg (not administered)  albuterol (PROVENTIL) (2.5 MG/3ML) 0.083% nebulizer solution 5 mg (5 mg Nebulization Given 02/28/14 2106)   guaiFENesin-codeine 100-10 MG/5ML syrup Take 5-10 mLs by mouth 3 (three) times daily as needed for cough. 100 mL Dorthula Matas, PA-C  azithromycin (ZITHROMAX) 250 MG tablet Take 1 tablet (250 mg total) by mouth daily. 1 tab every day until  finished. 4 tablet Dorthula Matas, PA-C   23 y.o.Cheyenne Walton's evaluation in the Emergency Department is complete. It has been determined that no acute conditions requiring further emergency intervention are present at this time. The patient/guardian have been advised of the diagnosis and plan. We have discussed signs and symptoms that warrant return to the ED, such as changes or worsening in symptoms.  Vital signs are stable at discharge. Filed Vitals:   02/28/14 2059  BP: 106/71  Pulse: 88  Temp: 99.3 F (37.4 C)  Resp: 18    Patient/guardian has voiced understanding and agreed to follow-up with the PCP or specialist.  I personally performed the services described in this documentation, which was scribed in my presence. The recorded information has been reviewed and is accurate.     Dorthula Matas, PA-C 02/28/14 2346

## 2014-03-02 NOTE — ED Provider Notes (Signed)
Medical screening examination/treatment/procedure(s) were performed by non-physician practitioner and as supervising physician I was immediately available for consultation/collaboration.     Geoffery Lyons, MD 03/02/14 [phone]

## 2014-04-10 ENCOUNTER — Encounter (HOSPITAL_COMMUNITY): Payer: Self-pay | Admitting: Emergency Medicine

## 2014-05-18 ENCOUNTER — Emergency Department (HOSPITAL_COMMUNITY)
Admission: EM | Admit: 2014-05-18 | Discharge: 2014-05-18 | Disposition: A | Discharge status: Home / Self Care | Payer: Medicaid Other | Attending: Emergency Medicine | Admitting: Emergency Medicine

## 2014-05-18 ENCOUNTER — Emergency Department (INDEPENDENT_AMBULATORY_CARE_PROVIDER_SITE_OTHER)
Admission: EM | Admit: 2014-05-18 | Discharge: 2014-05-18 | Disposition: A | Discharge status: Other Acute Inpatient Hospital | Payer: Medicaid Other | Source: Home / Self Care | Attending: Family Medicine | Admitting: Family Medicine

## 2014-05-18 ENCOUNTER — Encounter (HOSPITAL_COMMUNITY): Payer: Self-pay | Admitting: *Deleted

## 2014-05-18 ENCOUNTER — Encounter (HOSPITAL_COMMUNITY): Payer: Self-pay

## 2014-05-18 ENCOUNTER — Emergency Department (HOSPITAL_COMMUNITY): Payer: Medicaid Other

## 2014-05-18 DIAGNOSIS — J45909 Unspecified asthma, uncomplicated: Secondary | ICD-10-CM | POA: Insufficient documentation

## 2014-05-18 DIAGNOSIS — Z79899 Other long term (current) drug therapy: Secondary | ICD-10-CM | POA: Insufficient documentation

## 2014-05-18 DIAGNOSIS — Z91040 Latex allergy status: Secondary | ICD-10-CM | POA: Insufficient documentation

## 2014-05-18 DIAGNOSIS — Z87891 Personal history of nicotine dependence: Secondary | ICD-10-CM | POA: Insufficient documentation

## 2014-05-18 DIAGNOSIS — R1031 Right lower quadrant pain: Secondary | ICD-10-CM | POA: Diagnosis not present

## 2014-05-18 DIAGNOSIS — N838 Other noninflammatory disorders of ovary, fallopian tube and broad ligament: Secondary | ICD-10-CM | POA: Insufficient documentation

## 2014-05-18 DIAGNOSIS — R109 Unspecified abdominal pain: Secondary | ICD-10-CM | POA: Diagnosis present

## 2014-05-18 DIAGNOSIS — Z3202 Encounter for pregnancy test, result negative: Secondary | ICD-10-CM | POA: Diagnosis not present

## 2014-05-18 DIAGNOSIS — R102 Pelvic and perineal pain: Secondary | ICD-10-CM | POA: Diagnosis not present

## 2014-05-18 DIAGNOSIS — Z87440 Personal history of urinary (tract) infections: Secondary | ICD-10-CM | POA: Diagnosis not present

## 2014-05-18 DIAGNOSIS — Z792 Long term (current) use of antibiotics: Secondary | ICD-10-CM | POA: Diagnosis not present

## 2014-05-18 DIAGNOSIS — Z791 Long term (current) use of non-steroidal anti-inflammatories (NSAID): Secondary | ICD-10-CM | POA: Diagnosis not present

## 2014-05-18 DIAGNOSIS — N83209 Unspecified ovarian cyst, unspecified side: Secondary | ICD-10-CM

## 2014-05-18 LAB — CBC WITH DIFFERENTIAL/PLATELET
Basophils Absolute: 0 10*3/uL (ref 0.0–0.1)
Basophils Relative: 0 % (ref 0–1)
EOS PCT: 3 % (ref 0–5)
Eosinophils Absolute: 0.3 10*3/uL (ref 0.0–0.7)
HCT: 40.5 % (ref 36.0–46.0)
Hemoglobin: 13.2 g/dL (ref 12.0–15.0)
LYMPHS PCT: 22 % (ref 12–46)
Lymphs Abs: 2.1 10*3/uL (ref 0.7–4.0)
MCH: 30.2 pg (ref 26.0–34.0)
MCHC: 32.6 g/dL (ref 30.0–36.0)
MCV: 92.7 fL (ref 78.0–100.0)
Monocytes Absolute: 0.6 10*3/uL (ref 0.1–1.0)
Monocytes Relative: 6 % (ref 3–12)
NEUTROS PCT: 69 % (ref 43–77)
Neutro Abs: 6.6 10*3/uL (ref 1.7–7.7)
Platelets: 293 10*3/uL (ref 150–400)
RBC: 4.37 MIL/uL (ref 3.87–5.11)
RDW: 13.6 % (ref 11.5–15.5)
WBC: 9.6 10*3/uL (ref 4.0–10.5)

## 2014-05-18 LAB — POCT URINALYSIS DIP (DEVICE)
BILIRUBIN URINE: NEGATIVE
GLUCOSE, UA: NEGATIVE mg/dL
Hgb urine dipstick: NEGATIVE
KETONES UR: NEGATIVE mg/dL
LEUKOCYTES UA: NEGATIVE
NITRITE: NEGATIVE
Protein, ur: NEGATIVE mg/dL
Specific Gravity, Urine: 1.025 (ref 1.005–1.030)
Urobilinogen, UA: 0.2 mg/dL (ref 0.0–1.0)
pH: 6 (ref 5.0–8.0)

## 2014-05-18 LAB — WET PREP, GENITAL
Clue Cells Wet Prep HPF POC: NONE SEEN
Trich, Wet Prep: NONE SEEN
Yeast Wet Prep HPF POC: NONE SEEN

## 2014-05-18 LAB — URINALYSIS, ROUTINE W REFLEX MICROSCOPIC
Bilirubin Urine: NEGATIVE
Glucose, UA: NEGATIVE mg/dL
Hgb urine dipstick: NEGATIVE
Ketones, ur: NEGATIVE mg/dL
NITRITE: NEGATIVE
PH: 7 (ref 5.0–8.0)
Protein, ur: NEGATIVE mg/dL
SPECIFIC GRAVITY, URINE: 1.01 (ref 1.005–1.030)
Urobilinogen, UA: 0.2 mg/dL (ref 0.0–1.0)

## 2014-05-18 LAB — COMPREHENSIVE METABOLIC PANEL
ALT: 14 U/L (ref 0–35)
AST: 15 U/L (ref 0–37)
Albumin: 3.6 g/dL (ref 3.5–5.2)
Alkaline Phosphatase: 107 U/L (ref 39–117)
Anion gap: 11 (ref 5–15)
BUN: 7 mg/dL (ref 6–23)
CO2: 24 meq/L (ref 19–32)
Calcium: 9.7 mg/dL (ref 8.4–10.5)
Chloride: 103 mEq/L (ref 96–112)
Creatinine, Ser: 0.72 mg/dL (ref 0.50–1.10)
GFR calc Af Amer: >90 mL/min (ref >90)
Glucose, Bld: 83 mg/dL (ref 70–99)
Potassium: 4.4 mEq/L (ref 3.7–5.3)
SODIUM: 138 meq/L (ref 137–147)
TOTAL PROTEIN: 7.3 g/dL (ref 6.0–8.3)
Total Bilirubin: <0.2 mg/dL — ABNORMAL LOW (ref 0.3–1.2)

## 2014-05-18 LAB — HIV ANTIBODY (ROUTINE TESTING W REFLEX): HIV: NONREACTIVE

## 2014-05-18 LAB — LIPASE, BLOOD: Lipase: 18 U/L (ref 11–59)

## 2014-05-18 LAB — URINE MICROSCOPIC-ADD ON

## 2014-05-18 LAB — POC URINE PREG, ED: PREG TEST UR: NEGATIVE

## 2014-05-18 LAB — RPR

## 2014-05-18 LAB — POCT PREGNANCY, URINE: PREG TEST UR: NEGATIVE

## 2014-05-18 MED ORDER — MORPHINE SULFATE 4 MG/ML IJ SOLN
4.0000 mg | Freq: Once | INTRAMUSCULAR | Status: AC
  Administered 2014-05-18: 4 mg via INTRAVENOUS
  Filled 2014-05-18: qty 1

## 2014-05-18 MED ORDER — ONDANSETRON HCL 4 MG/2ML IJ SOLN
4.0000 mg | Freq: Once | INTRAMUSCULAR | Status: AC
  Administered 2014-05-18: 4 mg via INTRAVENOUS
  Filled 2014-05-18: qty 2

## 2014-05-18 MED ORDER — HYDROCODONE-ACETAMINOPHEN 5-325 MG PO TABS: 1.0000 | ORAL_TABLET | ORAL | Status: DC | PRN

## 2014-05-18 NOTE — ED Notes (Signed)
Pt from home for eval of right side abd pain that started 1 week ago, pt denies any n/v/d or fevers. Pt also denies any urinary symptoms, discharge or vaginal odor. nad noted, not currently sexually active, LMP 05/03/14

## 2014-05-18 NOTE — ED Provider Notes (Signed)
Cheyenne Walton is a 23 y.o. female who presents to Urgent Care today for right lower quadrant abdominal pain. Patient has had a week and a half history of worsening right lower quadrant abdominal pain. The pain has become more severe recently. She denies any fevers or chills vomiting or diarrhea. Pain is worse with motion. She denies any worsening vaginal discharge. She denies any urinary symptoms. She has a history of left ectopic pregnancy with removal of fallopian tube.   Past Medical History  Diagnosis Date  . UTI (urinary tract infection)   . Ectopic pregnancy   . Asthma     "haven't used an inhaler in years"   Past Surgical History  Procedure Laterality Date  . Laparoscopy  09/13/2011    Procedure: LAPAROSCOPY OPERATIVE;  Surgeon: Tereso NewcomerUgonna A Anyanwu, MD;  Location: [facility];  Service: Gynecology;  Laterality: Left;  operative laparoscopy left salpingectomy with removal of ectopic pregnancy.  . Ectopic pregnancy surgery    . Laparoscopic unilateral salpingectomy      LEFT SIDE with ectopic pregnancy   History  Substance Use Topics  . Smoking status: Former Smoker -- 1.00 packs/day    Quit date: 04/08/2012  . Smokeless tobacco: Never Used  . Alcohol Use: No   ROS as above Medications: No current facility-administered medications for this encounter.   Current Outpatient Prescriptions  Medication Sig Dispense Refill  . azithromycin (ZITHROMAX) 250 MG tablet Take 1 tablet (250 mg total) by mouth daily. 1 tab every day until finished. 4 tablet 0  . folic acid (FOLVITE) 1 MG tablet Take 1 tablet (1 mg total) by mouth daily. 30 tablet 5  . guaiFENesin-codeine 100-10 MG/5ML syrup Take 5-10 mLs by mouth 3 (three) times daily as needed for cough. 100 mL 0  . ibuprofen (ADVIL,MOTRIN) 600 MG tablet Take 1 tablet (600 mg total) by mouth every 6 (six) hours. 30 tablet 0  . methocarbamol (ROBAXIN) 500 MG tablet Take 2 tablets (1,000 mg total) by mouth 4 (four) times daily as needed (Pain). 20  tablet 0  . Prenatal Vit-Fe Fumarate-FA (PRENATAL VITAMINS) 28-0.8 MG TABS Take 1 tablet by mouth daily. 30 tablet 2   Facility-Administered Medications Ordered in Other Encounters  Medication Dose Route Frequency Provider Last Rate Last Dose  . rho(d) immune globulin (RHIG/Rhophylac) injection 300 mcg  300 mcg Intramuscular Once Antionette CharLisa Jackson-Moore, MD       Allergies  Allergen Reactions  . Contrast Media [Iodinated Diagnostic Agents] Anaphylaxis  . Other Anaphylaxis, Rash and Other (See Comments)    Seafood - throat swelling  . Latex Hives     Exam:  BP 115/76 mmHg  Pulse 76  Temp(Src) 98.4 F (36.9 C) (Oral)  Resp 12  SpO2 97%  LMP 05/11/2014 Gen: Well NAD nontoxic appearing HEENT: EOMI,  MMM Lungs: Normal work of breathing. CTABL Heart: RRR no MRG Abd: Hypoactive bowel sounds. Tender palpation right lower quadrant with guarding present. Exts: Brisk capillary refill, warm and well perfused.   Results for orders placed or performed during the hospital encounter of 05/18/14 (from the past 24 hour(s))  POCT urinalysis dip (device)     Status: None   Collection Time: 05/18/14 10:30 AM  Result Value Ref Range   Glucose, UA NEGATIVE NEGATIVE mg/dL   Bilirubin Urine NEGATIVE NEGATIVE   Ketones, ur NEGATIVE NEGATIVE mg/dL   Specific Gravity, Urine 1.025 1.005 - 1.030   Hgb urine dipstick NEGATIVE NEGATIVE   pH 6.0 5.0 - 8.0   Protein,  ur NEGATIVE NEGATIVE mg/dL   Urobilinogen, UA 0.2 0.0 - 1.0 mg/dL   Nitrite NEGATIVE NEGATIVE   Leukocytes, UA NEGATIVE NEGATIVE  Pregnancy, urine POC     Status: None   Collection Time: 05/18/14 10:31 AM  Result Value Ref Range   Preg Test, Ur NEGATIVE NEGATIVE   No results found.  Assessment and Plan: 23 y.o. female with right lower quadrant abdominal pain. Concerning for appendicitis. Transfer to the emergency department for further evaluation and management.  Discussed warning signs or symptoms. Please see discharge instructions.  Patient expresses understanding.     Rodolph BongEvan S Wynn Kernes, MD 05/18/14 [phone]

## 2014-05-18 NOTE — ED Notes (Signed)
Pt complains of right lower abd pain, sent here from [facility]. [facility] concerned for appendix.

## 2014-05-18 NOTE — ED Provider Notes (Signed)
CSN: [id number]     Arrival date & time 05/18/14  1056 History   First MD Initiated Contact with Patient 05/18/14 1117     Chief Complaint  Patient presents with  . Abdominal Pain     (Consider location/radiation/quality/duration/timing/severity/associated sxs/prior Treatment) The history is provided by the patient and medical records.     Pt sent from Urgent Care, concern for appendicitis.  Pt reports sharp, crampy, pulling RLQ abdominal pain that has been constant and gradually worsening for 1.5 weeks.  Pain is 8/10 intensity.  It was initially low in her RLQ and has started to "creep up."  Pain is exacerbated by movement and certain positions.  Had one episode of nausea.  Denies fevers, chills/sweats, vomiting, change in bowel habits, bloody stools, urinary symptoms, abnormal vaginal discharge.  LMP Nov 25.  Pt had a baby at term via spontaneous vaginal delivery without complications 7 months ago.  She is not currently breastfeeding.   Is not concerned for STDs.    Past Medical History  Diagnosis Date  . UTI (urinary tract infection)   . Ectopic pregnancy   . Asthma     "haven't used an inhaler in years"   Past Surgical History  Procedure Laterality Date  . Laparoscopy  09/13/2011    Procedure: LAPAROSCOPY OPERATIVE;  Surgeon: Tereso NewcomerUgonna A Anyanwu, MD;  Location: [facility];  Service: Gynecology;  Laterality: Left;  operative laparoscopy left salpingectomy with removal of ectopic pregnancy.  . Ectopic pregnancy surgery    . Laparoscopic unilateral salpingectomy      LEFT SIDE with ectopic pregnancy   Family History  Problem Relation Age of Onset  . Anesthesia problems Neg Hx   . Hypotension Neg Hx   . Malignant hyperthermia Neg Hx   . Pseudochol deficiency Neg Hx   . Cancer Maternal Grandmother    History  Substance Use Topics  . Smoking status: Former Smoker -- 1.00 packs/day    Quit date: 04/08/2012  . Smokeless tobacco: Never Used  . Alcohol Use: No   OB History    Gravida  Para Term Preterm AB TAB SAB Ectopic Multiple Living   4 1 1  0 3 0 2 1 0 1     Review of Systems  All other systems reviewed and are negative.     Allergies  Contrast media; Other; and Latex  Home Medications   Prior to Admission medications   Medication Sig Start Date End Date Taking? Authorizing Provider  ibuprofen (ADVIL,MOTRIN) 600 MG tablet Take 1 tablet (600 mg total) by mouth every 6 (six) hours. 10/15/13  Yes Myriam Jacobsonobyn H Restrepo, MD  azithromycin (ZITHROMAX) 250 MG tablet Take 1 tablet (250 mg total) by mouth daily. 1 tab every day until finished. Patient not taking: Reported on 05/18/2014 03/01/14   Dorthula Matasiffany G Greene, PA-C  folic acid (FOLVITE) 1 MG tablet Take 1 tablet (1 mg total) by mouth daily. Patient not taking: Reported on 05/18/2014 03/10/13   Wilmer FloorLisa A Leftwich-Kirby, CNM  guaiFENesin-codeine 100-10 MG/5ML syrup Take 5-10 mLs by mouth 3 (three) times daily as needed for cough. Patient not taking: Reported on 05/18/2014 02/28/14   Dorthula Matasiffany G Greene, PA-C  methocarbamol (ROBAXIN) 500 MG tablet Take 2 tablets (1,000 mg total) by mouth 4 (four) times daily as needed (Pain). Patient not taking: Reported on 05/18/2014 12/06/13   Joni ReiningNicole Pisciotta, PA-C  Prenatal Vit-Fe Fumarate-FA (PRENATAL VITAMINS) 28-0.8 MG TABS Take 1 tablet by mouth daily. Patient not taking: Reported on 05/18/2014 03/10/13  Wilmer FloorLisa A Leftwich-Kirby, CNM   BP 116/64 mmHg  Pulse 72  Temp(Src) 98.6 F (37 C) (Oral)  Resp 20  Ht 5\' 4"  (1.626 m)  Wt 226 lb (102.513 kg)  BMI 38.77 kg/m2  SpO2 98%  LMP 05/11/2014 Physical Exam  Constitutional: She appears well-developed and well-nourished. No distress.  HENT:  Head: Normocephalic and atraumatic.  Neck: Neck supple.  Cardiovascular: Normal rate and regular rhythm.   Pulmonary/Chest: Effort normal and breath sounds normal. No respiratory distress. She has no wheezes. She has no rales.  Abdominal: Soft. She exhibits no distension and no mass. There is tenderness  in the right lower quadrant and suprapubic area. There is no rebound and no guarding.  Genitourinary: Uterus is tender. Cervix exhibits no motion tenderness. Right adnexum displays tenderness. Left adnexum displays no tenderness. No erythema, tenderness or bleeding in the vagina. No foreign body around the vagina. No signs of injury around the vagina. Vaginal discharge found.  Thick white malodorous vaginal discharge.    Neurological: She is alert.  Skin: She is not diaphoretic.  Nursing note and vitals reviewed.   ED Course  Procedures (including critical care time) Labs Review Labs Reviewed  WET PREP, GENITAL - Abnormal; Notable for the following:    WBC, Wet Prep HPF POC TOO NUMEROUS TO COUNT (*)    All other components within normal limits  COMPREHENSIVE METABOLIC PANEL - Abnormal; Notable for the following:    Total Bilirubin <0.2 (*)    All other components within normal limits  URINALYSIS, ROUTINE W REFLEX MICROSCOPIC - Abnormal; Notable for the following:    Leukocytes, UA SMALL (*)    All other components within normal limits  URINE MICROSCOPIC-ADD ON - Abnormal; Notable for the following:    Squamous Epithelial / LPF FEW (*)    Bacteria, UA FEW (*)    All other components within normal limits  GC/CHLAMYDIA PROBE AMP  CBC WITH DIFFERENTIAL  LIPASE, BLOOD  HIV ANTIBODY (ROUTINE TESTING)  RPR  POC URINE PREG, ED    Imaging Review [place] Transvaginal Non-ob  05/18/2014   CLINICAL DATA:  23 year old female with pelvic pain in female. Right lower quadrant abdominal pain. Right adnexal tenderness. Initial encounter.  EXAM: TRANSABDOMINAL AND TRANSVAGINAL ULTRASOUND OF PELVIS  DOPPLER ULTRASOUND OF OVARIES  TECHNIQUE: Both transabdominal and transvaginal ultrasound examinations of the pelvis were performed. Transabdominal technique was performed for global imaging of the pelvis including uterus, ovaries, adnexal regions, and pelvic cul-de-sac.  It was necessary to proceed with  endovaginal exam following the transabdominal exam to visualize the ovaries. Color and duplex Doppler ultrasound was utilized to evaluate blood flow to the ovaries.  COMPARISON:  Ob ultrasounds 05/13/2013 and earlier  FINDINGS: Uterus  Measurements: 8.2 x 3.4 x 4.1 cm. No fibroids or other mass visualized.  Endometrium  Thickness: 6 mm.  No focal abnormality visualized.  Right ovary  Measurements: 4.9 x 3.8 x 3.3 cm. Small follicles. Small complex area measuring up to 2.8 cm (image 70), with Doppler interrogation some solid elements are suggested (image 79), but this may simply relate to normal ovarian parenchyma. Otherwise normal appearance  Left ovary  Measurements: 3.2 x 2.3 x 2.1 cm. Normal appearance/no adnexal mass.  Pulsed Doppler evaluation of both ovaries demonstrates normal low-resistance arterial and venous waveforms.  Other findings  No free fluid.  IMPRESSION: 1. No evidence of ovarian torsion. Normal uterus. No pelvic free fluid. 2. Small complex area in the right ovary measuring 2.8 cm, with indeterminate  but probably benign characteristics (suggestive of but not classic for hemorrhagic cyst). Recommend 6-12 week follow-up ultrasound to ensure resolution. If the lesion is unchanged, then hemorrhagic cyst is unlikely and continued followup with either ultrasound or MRI should be considered. This recommendation follows the consensus statement: Management of Asymptomatic Ovarian and Other Adnexal Cysts Imaged at [place]: Society of Radiologists in Ultrasound Consensus Conference Statement. Radiology 2010; [phone].   Electronically Signed   By: Augusto Gamble M.D.   On: 05/18/2014 14:07   US Pelvis Complete  05/18/2014   CLINICAL DATA:  23 year old female with pelvic pain in female. Right lower quadrant abdominal pain. Right adnexal tenderness. Initial encounter.  EXAM: TRANSABDOMINAL AND TRANSVAGINAL ULTRASOUND OF PELVIS  DOPPLER ULTRASOUND OF OVARIES  TECHNIQUE: Both transabdominal and transvaginal  ultrasound examinations of the pelvis were performed. Transabdominal technique was performed for global imaging of the pelvis including uterus, ovaries, adnexal regions, and pelvic cul-de-sac.  It was necessary to proceed with endovaginal exam following the transabdominal exam to visualize the ovaries. Color and duplex Doppler ultrasound was utilized to evaluate blood flow to the ovaries.  COMPARISON:  Ob ultrasounds 05/13/2013 and earlier  FINDINGS: Uterus  Measurements: 8.2 x 3.4 x 4.1 cm. No fibroids or other mass visualized.  Endometrium  Thickness: 6 mm.  No focal abnormality visualized.  Right ovary  Measurements: 4.9 x 3.8 x 3.3 cm. Small follicles. Small complex area measuring up to 2.8 cm (image 70), with Doppler interrogation some solid elements are suggested (image 79), but this may simply relate to normal ovarian parenchyma. Otherwise normal appearance  Left ovary  Measurements: 3.2 x 2.3 x 2.1 cm. Normal appearance/no adnexal mass.  Pulsed Doppler evaluation of both ovaries demonstrates normal low-resistance arterial and venous waveforms.  Other findings  No free fluid.  IMPRESSION: 1. No evidence of ovarian torsion. Normal uterus. No pelvic free fluid. 2. Small complex area in the right ovary measuring 2.8 cm, with indeterminate but probably benign characteristics (suggestive of but not classic for hemorrhagic cyst). Recommend 6-12 week follow-up ultrasound to ensure resolution. If the lesion is unchanged, then hemorrhagic cyst is unlikely and continued followup with either ultrasound or MRI should be considered. This recommendation follows the consensus statement: Management of Asymptomatic Ovarian and Other Adnexal Cysts Imaged at [place]: Society of Radiologists in Ultrasound Consensus Conference Statement. Radiology 2010; [phone].   Electronically Signed   By: Augusto Gamble M.D.   On: 05/18/2014 14:07   [place] Art/ven Flow Abd Pelv Doppler  05/18/2014   CLINICAL DATA:  23 year old female with pelvic  pain in female. Right lower quadrant abdominal pain. Right adnexal tenderness. Initial encounter.  EXAM: TRANSABDOMINAL AND TRANSVAGINAL ULTRASOUND OF PELVIS  DOPPLER ULTRASOUND OF OVARIES  TECHNIQUE: Both transabdominal and transvaginal ultrasound examinations of the pelvis were performed. Transabdominal technique was performed for global imaging of the pelvis including uterus, ovaries, adnexal regions, and pelvic cul-de-sac.  It was necessary to proceed with endovaginal exam following the transabdominal exam to visualize the ovaries. Color and duplex Doppler ultrasound was utilized to evaluate blood flow to the ovaries.  COMPARISON:  Ob ultrasounds 05/13/2013 and earlier  FINDINGS: Uterus  Measurements: 8.2 x 3.4 x 4.1 cm. No fibroids or other mass visualized.  Endometrium  Thickness: 6 mm.  No focal abnormality visualized.  Right ovary  Measurements: 4.9 x 3.8 x 3.3 cm. Small follicles. Small complex area measuring up to 2.8 cm (image 70), with Doppler interrogation some solid elements are suggested (image 79), but this may simply  relate to normal ovarian parenchyma. Otherwise normal appearance  Left ovary  Measurements: 3.2 x 2.3 x 2.1 cm. Normal appearance/no adnexal mass.  Pulsed Doppler evaluation of both ovaries demonstrates normal low-resistance arterial and venous waveforms.  Other findings  No free fluid.  IMPRESSION: 1. No evidence of ovarian torsion. Normal uterus. No pelvic free fluid. 2. Small complex area in the right ovary measuring 2.8 cm, with indeterminate but probably benign characteristics (suggestive of but not classic for hemorrhagic cyst). Recommend 6-12 week follow-up ultrasound to ensure resolution. If the lesion is unchanged, then hemorrhagic cyst is unlikely and continued followup with either ultrasound or MRI should be considered. This recommendation follows the consensus statement: Management of Asymptomatic Ovarian and Other Adnexal Cysts Imaged at [place]: Society of Radiologists in  Ultrasound Consensus Conference Statement. Radiology 2010; [phone].   Electronically Signed   By: Augusto Gamble M.D.   On: 05/18/2014 14:07     EKG Interpretation None       Discussed pt, results, and plan with Dr Fredderick Phenix.   3:08 PM Discussed all results with patient.  I strongly doubt appendicitis in this patient and have discussed all results and my thoughts with patient.  Pt is aware that we have not 100% ruled out appendicitis without a CT scan and we have discussed strict return precautions.  She is comfortable with this plan.  Pt does have a reason for her pain in the hemorrhagic cyst of the right ovary and she does not have fever, leukocytosis, N/V, or any factors that make me concerned for appendicitis.   MDM   Final diagnoses:  RLQ abdominal pain  Pelvic pain in female  Hemorrhagic ovarian cyst   Afebrile, nontoxic patient with 1.5 weeks of RLQ pain without associated symptoms.  Found to have hemorrhagic cyst on pelvic US.  Does have heavy vaginal discharge but pt without concern for STD.  Cultures pending.  Please see discussion above regarding joint decision making with the patient about appendicitis rule out.   D/C home with norco, gyn follow up.  Discussed result, findings, treatment, and follow up  with patient.  Pt given return precautions.  Pt verbalizes understanding and agrees with plan.         [place], PA-C 05/18/14 1651  Rolan Bucco, MD 05/18/14 [phone]

## 2014-05-18 NOTE — Discharge Instructions (Signed)
Read the information below.  Use the prescribed medication as directed.  Please discuss all new medications with your pharmacist.  Do not take additional tylenol while taking the prescribed pain medication to avoid overdose.  You may return to the Emergency Department at any time for worsening condition or any new symptoms that concern you.    If you develop high fevers, worsening abdominal pain, uncontrolled vomiting, or are unable to tolerate fluids by mouth, return to the ER for a recheck.   °

## 2014-05-18 NOTE — ED Notes (Addendum)
PAIN  R   SIDE   WITH     NAUSEA    /   DISCHARGE          SYMPTOMS  X  1  WEEK       PT  AMBULATED  TO  ROOM  WITH A  STEADY  FLUID  GAIT    SYMPTOMS  X  1  WEEK

## 2014-05-19 LAB — GC/CHLAMYDIA PROBE AMP
CT PROBE, AMP APTIMA: POSITIVE — AB
GC Probe RNA: NEGATIVE

## 2014-05-20 ENCOUNTER — Telehealth: Payer: Self-pay | Admitting: *Deleted

## 2014-05-20 NOTE — ED Provider Notes (Signed)
Culture results came back positive for chlamydia. Review the note showed uterine and adnexal tenderness, might need reevaluation for possible PID. Left message on the voicemail with the patient instructed her to come back to the ER for her test results.  Elwin MochaBlair Vena Bassinger, MD 05/20/14 [phone]

## 2014-05-23 ENCOUNTER — Telehealth (HOSPITAL_BASED_OUTPATIENT_CLINIC_OR_DEPARTMENT_OTHER): Payer: Self-pay | Admitting: Emergency Medicine

## 2014-05-23 NOTE — Telephone Encounter (Signed)
Post ED Visit - Positive Culture Follow-up: Chart Hand-off to ED Flow Manager  Culture assessed and recommendations reviewed by: []  Wes Dulaney, Pharm.D., BCPS []  Celedonio MiyamotoJeremy Frens, Pharm.D., BCPS []  Georgina PillionElizabeth Martin, Pharm.D., BCPS []  SherwoodMinh Pham, [place]Pharm[place]D., BCPS, AAHIVP []  Estella HuskMichelle Turner, Pharm .D., BCPS, AAHIVP []  Babs BertinHaley Baird, [address]Pharm[address]  Positive chlamydia culture  [x]  Patient discharged without antimicrobial prescription and treatment is now indicated []  Organism is resistant to prescribed ED discharge antimicrobial []  Patient with positive blood cultures  Changes discussed with ED provider: Gwendolyn GrantWalden Pt instructed to return to ED for treatment of chlamydia and reeval for [possible PID   Berle MullMiller, Yoandri Congrove 05/23/2014, 12:47 PM

## 2014-05-24 ENCOUNTER — Emergency Department (HOSPITAL_COMMUNITY)
Admission: EM | Admit: 2014-05-24 | Discharge: 2014-05-24 | Disposition: A | Discharge status: Home / Self Care | Payer: Medicaid Other | Attending: Emergency Medicine | Admitting: Emergency Medicine

## 2014-05-24 ENCOUNTER — Encounter (HOSPITAL_COMMUNITY): Payer: Self-pay | Admitting: Emergency Medicine

## 2014-05-24 DIAGNOSIS — Z79899 Other long term (current) drug therapy: Secondary | ICD-10-CM | POA: Diagnosis not present

## 2014-05-24 DIAGNOSIS — Z3202 Encounter for pregnancy test, result negative: Secondary | ICD-10-CM | POA: Insufficient documentation

## 2014-05-24 DIAGNOSIS — R109 Unspecified abdominal pain: Secondary | ICD-10-CM | POA: Diagnosis present

## 2014-05-24 DIAGNOSIS — A5609 Other chlamydial infection of lower genitourinary tract: Secondary | ICD-10-CM | POA: Diagnosis not present

## 2014-05-24 DIAGNOSIS — A749 Chlamydial infection, unspecified: Secondary | ICD-10-CM

## 2014-05-24 DIAGNOSIS — Z91040 Latex allergy status: Secondary | ICD-10-CM | POA: Diagnosis not present

## 2014-05-24 DIAGNOSIS — N390 Urinary tract infection, site not specified: Secondary | ICD-10-CM

## 2014-05-24 DIAGNOSIS — Z87891 Personal history of nicotine dependence: Secondary | ICD-10-CM | POA: Insufficient documentation

## 2014-05-24 DIAGNOSIS — J45909 Unspecified asthma, uncomplicated: Secondary | ICD-10-CM | POA: Insufficient documentation

## 2014-05-24 LAB — URINALYSIS, ROUTINE W REFLEX MICROSCOPIC
BILIRUBIN URINE: NEGATIVE
Glucose, UA: NEGATIVE mg/dL
Ketones, ur: NEGATIVE mg/dL
Nitrite: NEGATIVE
Protein, ur: NEGATIVE mg/dL
SPECIFIC GRAVITY, URINE: 1.03 (ref 1.005–1.030)
UROBILINOGEN UA: 0.2 mg/dL (ref 0.0–1.0)
pH: 5 (ref 5.0–8.0)

## 2014-05-24 LAB — URINE MICROSCOPIC-ADD ON

## 2014-05-24 LAB — POC URINE PREG, ED: PREG TEST UR: NEGATIVE

## 2014-05-24 MED ORDER — DOXYCYCLINE HYCLATE 100 MG PO CAPS: 100.0000 mg | ORAL_CAPSULE | Freq: Two times a day (BID) | ORAL | Status: DC

## 2014-05-24 MED ORDER — CEPHALEXIN 500 MG PO CAPS: 500.0000 mg | ORAL_CAPSULE | Freq: Four times a day (QID) | ORAL | Status: DC

## 2014-05-24 MED ORDER — AZITHROMYCIN 250 MG PO TABS
1000.0000 mg | ORAL_TABLET | Freq: Once | ORAL | Status: AC
  Administered 2014-05-24: 1000 mg via ORAL
  Filled 2014-05-24: qty 4

## 2014-05-24 NOTE — ED Provider Notes (Signed)
CSN: [id number]     Arrival date & time 05/24/14  1141 History   First MD Initiated Contact with Patient 05/24/14 1435     Chief Complaint  Patient presents with  . Abdominal Pain     (Consider location/radiation/quality/duration/timing/severity/associated sxs/prior Treatment) HPI Comments: The patient is a 23 year old female, she has a history of having chlamydia multiple times in the past and according to the medical record she has tested +8 months ago, 7 months ago and then again 6 days ago. She reports that over the last week she has had some right-sided abdominal pain which is located in her right midabdomen up onto her right lower lateral chest wall.  She denies any dysuria or diarrhea, she has had a small amount of vaginal discharge but this is a chronic finding for her. She reports that she has not had any sexual activity since 7 months ago when she had her daughter. This was a vaginal birth, she reports receiving IV antibiotics during childbirth for 4 hours, she is unaware what they were treating. The symptoms are persistent, she did have an ultrasound showing that she had a small right ovarian cyst, no fevers chills nausea vomiting or any other complaints.  Patient is a 23 y.o. female presenting with abdominal pain. The history is provided by the patient and medical records.  Abdominal Pain   Past Medical History  Diagnosis Date  . UTI (urinary tract infection)   . Ectopic pregnancy   . Asthma     "haven't used an inhaler in years"   Past Surgical History  Procedure Laterality Date  . Laparoscopy  09/13/2011    Procedure: LAPAROSCOPY OPERATIVE;  Surgeon: Tereso NewcomerUgonna A Anyanwu, MD;  Location: [facility];  Service: Gynecology;  Laterality: Left;  operative laparoscopy left salpingectomy with removal of ectopic pregnancy.  . Ectopic pregnancy surgery    . Laparoscopic unilateral salpingectomy      LEFT SIDE with ectopic pregnancy   Family History  Problem Relation Age of Onset  .  Anesthesia problems Neg Hx   . Hypotension Neg Hx   . Malignant hyperthermia Neg Hx   . Pseudochol deficiency Neg Hx   . Cancer Maternal Grandmother    History  Substance Use Topics  . Smoking status: Former Smoker -- 1.00 packs/day    Quit date: 04/08/2012  . Smokeless tobacco: Never Used  . Alcohol Use: No   OB History    Gravida Para Term Preterm AB TAB SAB Ectopic Multiple Living   4 1 1  0 3 0 2 1 0 1     Review of Systems  Gastrointestinal: Positive for abdominal pain.  All other systems reviewed and are negative.     Allergies  Contrast media; Other; and Latex  Home Medications   Prior to Admission medications   Medication Sig Start Date End Date Taking? Authorizing Provider  azithromycin (ZITHROMAX) 250 MG tablet Take 1 tablet (250 mg total) by mouth daily. 1 tab every day until finished. Patient not taking: Reported on 05/18/2014 03/01/14   Dorthula Matasiffany G Greene, PA-C  cephALEXin (KEFLEX) 500 MG capsule Take 1 capsule (500 mg total) by mouth 4 (four) times daily. 05/24/14   Vida RollerBrian D Plumer Mittelstaedt, MD  doxycycline (VIBRAMYCIN) 100 MG capsule Take 1 capsule (100 mg total) by mouth 2 (two) times daily. 05/24/14   Vida RollerBrian D Roy Tokarz, MD  folic acid (FOLVITE) 1 MG tablet Take 1 tablet (1 mg total) by mouth daily. Patient not taking: Reported on 05/18/2014 03/10/13  Lisa A Leftwich-Kirby, CNM  guaiFENesin-codeine 100-10 MG/5ML syrup Take 5-10 mLs by mouth 3 (three) times daily as needed for cough. Patient not taking: Reported on 05/18/2014 02/28/14   Dorthula Matasiffany G Greene, PA-C  HYDROcodone-acetaminophen (NORCO/VICODIN) 5-325 MG per tablet Take 1-2 tablets by mouth every 4 (four) hours as needed for moderate pain or severe pain. 05/18/14   Trixie DredgeEmily West, PA-C  ibuprofen (ADVIL,MOTRIN) 600 MG tablet Take 1 tablet (600 mg total) by mouth every 6 (six) hours. 10/15/13   Myriam Jacobsonobyn H Restrepo, MD  methocarbamol (ROBAXIN) 500 MG tablet Take 2 tablets (1,000 mg total) by mouth 4 (four) times daily as needed  (Pain). Patient not taking: Reported on 05/18/2014 12/06/13   Joni ReiningNicole Pisciotta, PA-C  Prenatal Vit-Fe Fumarate-FA (PRENATAL VITAMINS) 28-0.8 MG TABS Take 1 tablet by mouth daily. Patient not taking: Reported on 05/18/2014 03/10/13   Wilmer FloorLisa A Leftwich-Kirby, CNM   BP 145/82 mmHg  Pulse 60  Temp(Src) 98 F (36.7 C) (Oral)  Resp 18  Ht 5\' 4"  (1.626 m)  Wt 220 lb (99.791 kg)  BMI 37.74 kg/m2  SpO2 100%  LMP 05/11/2014 Physical Exam  Constitutional: She appears well-developed and well-nourished. No distress.  HENT:  Head: Normocephalic and atraumatic.  Mouth/Throat: Oropharynx is clear and moist. No oropharyngeal exudate.  Eyes: Conjunctivae and EOM are normal. Pupils are equal, round, and reactive to light. Right eye exhibits no discharge. Left eye exhibits no discharge. No scleral icterus.  Neck: Normal range of motion. Neck supple. No JVD present. No thyromegaly present.  Cardiovascular: Normal rate, regular rhythm, normal heart sounds and intact distal pulses.  Exam reveals no gallop and no friction rub.   No murmur heard. Pulmonary/Chest: Effort normal and breath sounds normal. No respiratory distress. She has no wheezes. She has no rales.  Abdominal: Soft. Bowel sounds are normal. She exhibits no distension and no mass. There is no tenderness.  No reproducible ttp - she does have tenderness over her right lower chest wall on the lower ribs, there is no rash in this area, absolutely no abdominal tenderness or discomfort to palpation  Musculoskeletal: Normal range of motion. She exhibits no edema or tenderness.  Lymphadenopathy:    She has no cervical adenopathy.  Neurological: She is alert. Coordination normal.  Skin: Skin is warm and dry. No rash noted. No erythema.  Psychiatric: She has a normal mood and affect. Her behavior is normal.  Nursing note and vitals reviewed.   ED Course  Procedures (including critical care time) Labs Review Labs Reviewed  URINALYSIS, ROUTINE W REFLEX  MICROSCOPIC - Abnormal; Notable for the following:    APPearance TURBID (*)    Hgb urine dipstick TRACE (*)    Leukocytes, UA MODERATE (*)    All other components within normal limits  URINE MICROSCOPIC-ADD ON - Abnormal; Notable for the following:    Squamous Epithelial / LPF MANY (*)    Bacteria, UA MANY (*)    All other components within normal limits  URINE CULTURE  POC URINE PREG, ED    Imaging Review No results found.    MDM   Final diagnoses:  Chlamydia  UTI (lower urinary tract infection)    The patient has recently had her pelvic exam, she tested positive for chlamydia again, her urinalysis does show infection as well, she will receive antibiotics for this and I have asked her to return to her doctor or the ER for a treatment of care test for chlamydia. I have encouraged her not to  engage in sexual activity until she test negative. the patient is aware, she has normal vital signs.  These findings were discussed with the patient, she will be treated with Zithromax, doxycycline as an outpatient, Keflex as an outpatient to treat for the urinary tract infection which is concurrent with chlamydial infection. Urine culture ordered. They will  Meds given in ED:  Medications  azithromycin (ZITHROMAX) tablet 1,000 mg (not administered)    New Prescriptions   CEPHALEXIN (KEFLEX) 500 MG CAPSULE    Take 1 capsule (500 mg total) by mouth 4 (four) times daily.   DOXYCYCLINE (VIBRAMYCIN) 100 MG CAPSULE    Take 1 capsule (100 mg total) by mouth 2 (two) times daily.      Vida Roller, MD 05/24/14 [phone]

## 2014-05-24 NOTE — Discharge Instructions (Signed)
Please call your doctor for a followup appointment within 24-48 hours. When you talk to your doctor please let them know that you were seen in the emergency department and have them acquire all of your records so that they can discuss the findings with you and formulate a treatment plan to fully care for your new and ongoing problems. ° °

## 2014-05-24 NOTE — ED Notes (Signed)
Pt c/o right lower abd pain sts seen here recently for same and told had ovarian cyst; pt sts some discharge but is normal for her

## 2014-05-24 NOTE — ED Notes (Signed)
Pt from home with c/o right sided abd pain, seen here last week for same and discharged, reports nothing has changed except for pain has not gotten better. Denies any n/v/d or fevers, denies any vaginal bleeding or odor. nad noted. Bowel sounds present

## 2014-05-25 LAB — URINE CULTURE

## 2014-06-19 ENCOUNTER — Encounter (HOSPITAL_COMMUNITY): Payer: Self-pay | Admitting: *Deleted

## 2014-06-19 ENCOUNTER — Emergency Department (INDEPENDENT_AMBULATORY_CARE_PROVIDER_SITE_OTHER)
Admission: EM | Admit: 2014-06-19 | Discharge: 2014-06-19 | Disposition: A | Discharge status: Home / Self Care | Payer: Medicaid Other | Source: Home / Self Care | Attending: Family Medicine | Admitting: Family Medicine

## 2014-06-19 ENCOUNTER — Other Ambulatory Visit (HOSPITAL_COMMUNITY)
Admission: RE | Admit: 2014-06-19 | Discharge: 2014-06-19 | Disposition: A | Discharge status: Home / Self Care | Payer: Medicaid Other | Source: Ambulatory Visit | Attending: Family Medicine | Admitting: Family Medicine

## 2014-06-19 DIAGNOSIS — N898 Other specified noninflammatory disorders of vagina: Secondary | ICD-10-CM

## 2014-06-19 DIAGNOSIS — Z113 Encounter for screening for infections with a predominantly sexual mode of transmission: Secondary | ICD-10-CM | POA: Diagnosis not present

## 2014-06-19 DIAGNOSIS — N760 Acute vaginitis: Secondary | ICD-10-CM

## 2014-06-19 LAB — POCT URINALYSIS DIP (DEVICE)
Bilirubin Urine: NEGATIVE
Glucose, UA: NEGATIVE mg/dL
HGB URINE DIPSTICK: NEGATIVE
Ketones, ur: NEGATIVE mg/dL
Leukocytes, UA: NEGATIVE
Nitrite: NEGATIVE
PH: 6.5 (ref 5.0–8.0)
PROTEIN: NEGATIVE mg/dL
Specific Gravity, Urine: 1.025 (ref 1.005–1.030)
Urobilinogen, UA: 0.2 mg/dL (ref 0.0–1.0)

## 2014-06-19 LAB — POCT PREGNANCY, URINE: Preg Test, Ur: NEGATIVE

## 2014-06-19 MED ORDER — FLUCONAZOLE 150 MG PO TABS: ORAL_TABLET | ORAL | Status: DC

## 2014-06-19 MED ORDER — METRONIDAZOLE 500 MG PO TABS: 500.0000 mg | ORAL_TABLET | Freq: Two times a day (BID) | ORAL | Status: DC

## 2014-06-19 NOTE — ED Provider Notes (Addendum)
CSN: [id number]     Arrival date & time 06/19/14  1311 History   First MD Initiated Contact with Patient 06/19/14 1500     Chief Complaint  Patient presents with  . Vaginal Discharge   (Consider location/radiation/quality/duration/timing/severity/associated sxs/prior Treatment) HPI Comments: 24 year old female complaining of vaginal discharge for 2 weeks. She also complaining of vulvovaginal itching and low pelvic discomfort. Denies fever, vomiting and diarrhea. She occasionally has mild transient nausea approximate area of the day. LMP 06/06/2014. Denies abdominal pain.   Past Medical History  Diagnosis Date  . UTI (urinary tract infection)   . Ectopic pregnancy   . Asthma     "haven't used an inhaler in years"   Past Surgical History  Procedure Laterality Date  . Laparoscopy  09/13/2011    Procedure: LAPAROSCOPY OPERATIVE;  Surgeon: Tereso NewcomerUgonna A Anyanwu, MD;  Location: [facility];  Service: Gynecology;  Laterality: Left;  operative laparoscopy left salpingectomy with removal of ectopic pregnancy.  . Ectopic pregnancy surgery    . Laparoscopic unilateral salpingectomy      LEFT SIDE with ectopic pregnancy   Family History  Problem Relation Age of Onset  . Anesthesia problems Neg Hx   . Hypotension Neg Hx   . Malignant hyperthermia Neg Hx   . Pseudochol deficiency Neg Hx   . Cancer Maternal Grandmother    History  Substance Use Topics  . Smoking status: Former Smoker -- 1.00 packs/day    Quit date: 04/08/2012  . Smokeless tobacco: Never Used  . Alcohol Use: No   OB History    Gravida Para Term Preterm AB TAB SAB Ectopic Multiple Living   4 1 1  0 3 0 2 1 0 1     Review of Systems  Constitutional: Negative.   HENT: Negative.   Respiratory: Negative for cough and shortness of breath.   Cardiovascular: Negative for chest pain.  Gastrointestinal: Negative for vomiting, abdominal pain, diarrhea, constipation, abdominal distention and rectal pain.  Genitourinary: Positive for  vaginal discharge, vaginal pain and pelvic pain. Negative for dysuria, frequency, flank pain, decreased urine volume, vaginal bleeding, menstrual problem and dyspareunia.  Neurological: Negative.     Allergies  Contrast media; Other; and Latex  Home Medications   Prior to Admission medications   Medication Sig Start Date End Date Taking? Authorizing Provider  cephALEXin (KEFLEX) 500 MG capsule Take 1 capsule (500 mg total) by mouth 4 (four) times daily. 05/24/14   Vida RollerBrian D Miller, MD  fluconazole (DIFLUCAN) 150 MG tablet 1 tab po x 1. May repeat in 72 hours if no improvement 06/19/14   Hayden Rasmussenavid Mashell Sieben, NP  HYDROcodone-acetaminophen (NORCO/VICODIN) 5-325 MG per tablet Take 1-2 tablets by mouth every 4 (four) hours as needed for moderate pain or severe pain. 05/18/14   Trixie DredgeEmily West, PA-C  ibuprofen (ADVIL,MOTRIN) 600 MG tablet Take 1 tablet (600 mg total) by mouth every 6 (six) hours. 10/15/13   Myriam Jacobsonobyn H Restrepo, MD  metroNIDAZOLE (FLAGYL) 500 MG tablet Take 1 tablet (500 mg total) by mouth 2 (two) times daily. X 7 days 06/19/14   Hayden Rasmussenavid Azul Brumett, NP   BP 117/81 mmHg  Pulse 73  Temp(Src) 98.1 F (36.7 C) (Oral)  Resp 16  SpO2 100%  LMP 06/08/2014 Physical Exam  Constitutional: She is oriented to person, place, and time. She appears well-developed and well-nourished. No distress.  Neck: Normal range of motion. Neck supple.  Pulmonary/Chest: Effort normal. No respiratory distress.  Abdominal: Soft. She exhibits no distension and no mass. There is  no tenderness. There is no rebound and no guarding.  Genitourinary:  Anterior palpation of pelvis reveals no tenderness or masses. Normal external female genitalia. Opening the introitus reveals streaky red areas of irritation that is tender and painful. No other lesions. Vagina and vaginal vault coated with thin white discharge. Cervix was beyond the reach of the speculum. Due to large body habitus. A portion of the cervix was visualized. Samples were  obtained. Using a Fox swab to mobilize the cervix did not produce pain. Bimanual exam negative for pain.  Neurological: She is alert and oriented to person, place, and time.  Skin: Skin is warm and dry.  Psychiatric: She has a normal mood and affect.  Nursing note and vitals reviewed.   ED Course  Procedures (including critical care time) Labs Review Labs Reviewed  POCT URINALYSIS DIP (DEVICE)  POCT PREGNANCY, URINE  CERVICOVAGINAL ANCILLARY ONLY   Results for orders placed or performed during the hospital encounter of 06/19/14  POCT urinalysis dip (device)  Result Value Ref Range   Glucose, UA NEGATIVE NEGATIVE mg/dL   Bilirubin Urine NEGATIVE NEGATIVE   Ketones, ur NEGATIVE NEGATIVE mg/dL   Specific Gravity, Urine 1.025 1.005 - 1.030   Hgb urine dipstick NEGATIVE NEGATIVE   pH 6.5 5.0 - 8.0   Protein, ur NEGATIVE NEGATIVE mg/dL   Urobilinogen, UA 0.2 0.0 - 1.0 mg/dL   Nitrite NEGATIVE NEGATIVE   Leukocytes, UA NEGATIVE NEGATIVE  Pregnancy, urine POC  Result Value Ref Range   Preg Test, Ur NEGATIVE NEGATIVE     Imaging Review No results found.   MDM   1. Vaginal discharge   2. Vaginitis    Use OTC Monistat cream for external vulvar redness, itching and tenderness Diflucan 150 mg x 2  Flagyl Cervical cytology pending    Hayden Rasmussen, NP 06/19/14 1541  Sent Rx for azithro 1gm po stat to her pharmacy 06/20/14  Hayden Rasmussen, NP 06/20/14 2020

## 2014-06-19 NOTE — Discharge Instructions (Signed)
Bacterial Vaginosis °Bacterial vaginosis is a vaginal infection that occurs when the normal balance of bacteria in the vagina is disrupted. It results from an overgrowth of certain bacteria. This is the most common vaginal infection in women of childbearing age. Treatment is important to prevent complications, especially in pregnant women, as it can cause a premature delivery. °CAUSES  °Bacterial vaginosis is caused by an increase in harmful bacteria that are normally present in smaller amounts in the vagina. Several different kinds of bacteria can cause bacterial vaginosis. However, the reason that the condition develops is not fully understood. °RISK FACTORS °Certain activities or behaviors can put you at an increased risk of developing bacterial vaginosis, including: °· Having a new sex partner or multiple sex partners. °· Douching. °· Using an intrauterine device (IUD) for contraception. °Women do not get bacterial vaginosis from toilet seats, bedding, swimming pools, or contact with objects around them. °SIGNS AND SYMPTOMS  °Some women with bacterial vaginosis have no signs or symptoms. Common symptoms include: °· Grey vaginal discharge. °· A fishlike odor with discharge, especially after sexual intercourse. °· Itching or burning of the vagina and vulva. °· Burning or pain with urination. °DIAGNOSIS  °Your health care provider will take a medical history and examine the vagina for signs of bacterial vaginosis. A sample of vaginal fluid may be taken. Your health care provider will look at this sample under a microscope to check for bacteria and abnormal cells. A vaginal pH test may also be done.  °TREATMENT  °Bacterial vaginosis may be treated with antibiotic medicines. These may be given in the form of a pill or a vaginal cream. A second round of antibiotics may be prescribed if the condition comes back after treatment.  °HOME CARE INSTRUCTIONS  °· Only take over-the-counter or prescription medicines as  directed by your health care provider. °· If antibiotic medicine was prescribed, take it as directed. Make sure you finish it even if you start to feel better. °· Do not have sex until treatment is completed. °· Tell all sexual partners that you have a vaginal infection. They should see their health care provider and be treated if they have problems, such as a mild rash or itching. °· Practice safe sex by using condoms and only having one sex partner. °SEEK MEDICAL CARE IF:  °· Your symptoms are not improving after 3 days of treatment. °· You have increased discharge or pain. °· You have a fever. °MAKE SURE YOU:  °· Understand these instructions. °· Will watch your condition. °· Will get help right away if you are not doing well or get worse. °FOR MORE INFORMATION  °Centers for Disease Control and Prevention, Division of STD Prevention: www.cdc.gov/std °American Sexual Health Association (ASHA): www.ashastd.org  °Document Released: 05/26/2005 Document Revised: 03/16/2013 Document Reviewed: 01/05/2013 °ExitCare® Patient Information ©2015 [employer]. This information is not intended to replace advice given to you by your health care provider. Make sure you discuss any questions you have with your health care provider. ° °Vaginitis °Vaginitis is an inflammation of the vagina. It is most often caused by a change in the normal balance of the bacteria and yeast that live in the vagina. This change in balance causes an overgrowth of certain bacteria or yeast, which causes the inflammation. There are different types of vaginitis, but the most common types are: °· Bacterial vaginosis. °· Yeast infection (candidiasis). °· Trichomoniasis vaginitis. This is a sexually transmitted infection (STI). °· Viral vaginitis. °· Atropic vaginitis. °· Allergic vaginitis. °  CAUSES  °The cause depends on the type of vaginitis. Vaginitis can be caused by: °· Bacteria (bacterial vaginosis). °· Yeast (yeast infection). °· A parasite  (trichomoniasis vaginitis) °· A virus (viral vaginitis). °· Low hormone levels (atrophic vaginitis). Low hormone levels can occur during pregnancy, breastfeeding, or after menopause. °· Irritants, such as bubble baths, scented tampons, and feminine sprays (allergic vaginitis). °Other factors can change the normal balance of the yeast and bacteria that live in the vagina. These include: °· Antibiotic medicines. °· Poor hygiene. °· Diaphragms, vaginal sponges, spermicides, birth control pills, and intrauterine devices (IUD). °· Sexual intercourse. °· Infection. °· Uncontrolled diabetes. °· A weakened immune system. °SYMPTOMS  °Symptoms can vary depending on the cause of the vaginitis. Common symptoms include: °· Abnormal vaginal discharge. °¨ The discharge is white, gray, or yellow with bacterial vaginosis. °¨ The discharge is thick, white, and cheesy with a yeast infection. °¨ The discharge is frothy and yellow or greenish with trichomoniasis. °· A bad vaginal odor. °¨ The odor is fishy with bacterial vaginosis. °· Vaginal itching, pain, or swelling. °· Painful intercourse. °· Pain or burning when urinating. °Sometimes, there are no symptoms. °TREATMENT  °Treatment will vary depending on the type of infection.  °· Bacterial vaginosis and trichomoniasis are often treated with antibiotic creams or pills. °· Yeast infections are often treated with antifungal medicines, such as vaginal creams or suppositories. °· Viral vaginitis has no cure, but symptoms can be treated with medicines that relieve discomfort. Your sexual partner should be treated as well. °· Atrophic vaginitis may be treated with an estrogen cream, pill, suppository, or vaginal ring. If vaginal dryness occurs, lubricants and moisturizing creams may help. You may be told to avoid scented soaps, sprays, or douches. °· Allergic vaginitis treatment involves quitting the use of the product that is causing the problem. Vaginal creams can be used to treat the  symptoms. °HOME CARE INSTRUCTIONS  °· Take all medicines as directed by your caregiver. °· Keep your genital area clean and dry. Avoid soap and only rinse the area with water. °· Avoid douching. It can remove the healthy bacteria in the vagina. °· Do not use tampons or have sexual intercourse until your vaginitis has been treated. Use sanitary pads while you have vaginitis. °· Wipe from front to back. This avoids the spread of bacteria from the rectum to the vagina. °· Let air reach your genital area. °¨ Wear cotton underwear to decrease moisture buildup. °¨ Avoid wearing underwear while you sleep until your vaginitis is gone. °¨ Avoid tight pants and underwear or nylons without a cotton panel. °¨ Take off wet clothing (especially bathing suits) as soon as possible. °· Use mild, non-scented products. Avoid using irritants, such as: °¨ Scented feminine sprays. °¨ Fabric softeners. °¨ Scented detergents. °¨ Scented tampons. °¨ Scented soaps or bubble baths. °· Practice safe sex and use condoms. Condoms may prevent the spread of trichomoniasis and viral vaginitis. °SEEK MEDICAL CARE IF:  °· You have abdominal pain. °· You have a fever or persistent symptoms for more than 2-3 days. °· You have a fever and your symptoms suddenly get worse. °Document Released: 03/23/2007 Document Revised: 02/18/2012 Document Reviewed: 11/06/2011 °ExitCare® Patient Information ©2015 [employer]. This information is not intended to replace advice given to you by your health care provider. Make sure you discuss any questions you have with your health care provider. ° °

## 2014-06-19 NOTE — ED Notes (Signed)
Pt  Reports  Symptoms  Of  Vaginal  Discomfort    With  A  Slight  Discharge  For  About  1  Week     She  Ambulated  To  Room  With a  Steady  Fluid  Gait    She is  Awake  And  Alert and oriented

## 2014-06-20 LAB — CERVICOVAGINAL ANCILLARY ONLY
CHLAMYDIA, DNA PROBE: POSITIVE — AB
NEISSERIA GONORRHEA: NEGATIVE
Wet Prep (BD Affirm): NEGATIVE
Wet Prep (BD Affirm): NEGATIVE
Wet Prep (BD Affirm): NEGATIVE

## 2014-06-20 MED ORDER — AZITHROMYCIN 250 MG PO TABS: ORAL_TABLET | ORAL | Status: DC

## 2014-06-21 NOTE — ED Notes (Signed)
GC neg., Chlamydia pos., Affirm: Candida, Gardnerella and Trich neg.  Pt. needs Zithromax.  Hayden Rasmussenavid Mabe NP e-prescribed Rx. to [facility] on [address]  I called pt. and she was not available-unable to leave a message.  I called contact and left a message for pt. to call.  Call 1. Mom called back and handed phone to her daughter.  Pt. verified x 2 and given results.  Pt. told she needs Zithromax for Chlamydia and told where to pick up Rx.  Pt. instructed to notify her partner, no sex for 1 week and to practice safe sex. Pt. told she can [facility], by appointment. DHHS form completed and [facility]. Vassie MoselleYork, Ehan Freas M 06/21/2014

## 2014-11-15 ENCOUNTER — Encounter: Payer: Self-pay | Admitting: General Practice

## 2015-01-07 ENCOUNTER — Encounter (HOSPITAL_COMMUNITY): Payer: Self-pay | Admitting: *Deleted

## 2015-01-07 ENCOUNTER — Emergency Department (HOSPITAL_COMMUNITY)
Admission: EM | Admit: 2015-01-07 | Discharge: 2015-01-07 | Disposition: A | Discharge status: Home / Self Care | Payer: Medicaid Other | Attending: Emergency Medicine | Admitting: Emergency Medicine

## 2015-01-07 DIAGNOSIS — K08409 Partial loss of teeth, unspecified cause, unspecified class: Secondary | ICD-10-CM

## 2015-01-07 DIAGNOSIS — Z91040 Latex allergy status: Secondary | ICD-10-CM | POA: Diagnosis not present

## 2015-01-07 DIAGNOSIS — Z79899 Other long term (current) drug therapy: Secondary | ICD-10-CM | POA: Diagnosis not present

## 2015-01-07 DIAGNOSIS — Z87891 Personal history of nicotine dependence: Secondary | ICD-10-CM | POA: Diagnosis not present

## 2015-01-07 DIAGNOSIS — Z791 Long term (current) use of non-steroidal anti-inflammatories (NSAID): Secondary | ICD-10-CM | POA: Insufficient documentation

## 2015-01-07 DIAGNOSIS — J45909 Unspecified asthma, uncomplicated: Secondary | ICD-10-CM | POA: Diagnosis not present

## 2015-01-07 DIAGNOSIS — R51 Headache: Secondary | ICD-10-CM | POA: Diagnosis not present

## 2015-01-07 DIAGNOSIS — Z87440 Personal history of urinary (tract) infections: Secondary | ICD-10-CM | POA: Insufficient documentation

## 2015-01-07 DIAGNOSIS — H9202 Otalgia, left ear: Secondary | ICD-10-CM | POA: Diagnosis not present

## 2015-01-07 DIAGNOSIS — Z792 Long term (current) use of antibiotics: Secondary | ICD-10-CM | POA: Insufficient documentation

## 2015-01-07 DIAGNOSIS — R220 Localized swelling, mass and lump, head: Secondary | ICD-10-CM | POA: Diagnosis present

## 2015-01-07 NOTE — ED Provider Notes (Signed)
CSN: [id number]     Arrival date & time 01/07/15  5638 History  This chart was scribed for non-physician practitioner Jeralyn Bennett, PA-C working with Elwin Mocha, MD by Lyndel Safe, ED Scribe. This patient was seen in room TR07C/TR07C and the patient's care was started at 9:32 AM.   Chief Complaint  Patient presents with  . Facial Swelling   The history is provided by the patient. No language interpreter was used.   HPI Comments:  Cheyenne Walton is a 24 y.o. female who presents to the Emergency Department complaining of delayed onset, constant, moderate facial swelling and soreness to bilateral sides s/p wisdom teeth extraction 6 days ago. She denies any complications with the procedure until 3 days ago when she noticed a painful hole in her leftcheek region that has grown in size since onset. Pt notes associated intermittent left-sided otalgia and mild headache. Her pain and swelling are more prominent in the left oral and facial region. She has taken prescribed hydrocodone and NSAID, tylenol extra strength and applied ice to the affected area with no relief of facial swelling. Pt was placed on a 10 day amoxicillin course after the surgery, which she has been compliant with. Denies recently or currently smoking tobacco. Additionally denies fevers, chills, nausea, or vomiting.   Past Medical History  Diagnosis Date  . UTI (urinary tract infection)   . Ectopic pregnancy   . Asthma     "haven't used an inhaler in years"   Past Surgical History  Procedure Laterality Date  . Laparoscopy  09/13/2011    Procedure: LAPAROSCOPY OPERATIVE;  Surgeon: Tereso Newcomer, MD;  Location: [facility];  Service: Gynecology;  Laterality: Left;  operative laparoscopy left salpingectomy with removal of ectopic pregnancy.  . Ectopic pregnancy surgery    . Laparoscopic unilateral salpingectomy      LEFT SIDE with ectopic pregnancy   Family History  Problem Relation Age of Onset  . Anesthesia problems Neg  Hx   . Hypotension Neg Hx   . Malignant hyperthermia Neg Hx   . Pseudochol deficiency Neg Hx   . Cancer Maternal Grandmother    History  Substance Use Topics  . Smoking status: Former Smoker -- 1.00 packs/day    Quit date: 04/08/2012  . Smokeless tobacco: Never Used  . Alcohol Use: No   OB History    Gravida Para Term Preterm AB TAB SAB Ectopic Multiple Living   0 3 0 2 1 0 1     Review of Systems  All other systems reviewed and are negative.  Allergies  Contrast media; Other; and Latex  Home Medications   Prior to Admission medications   Medication Sig Start Date End Date Taking? Authorizing Provider  azithromycin (ZITHROMAX) 250 MG tablet Take all 4 tabs po now 06/20/14   Hayden Rasmussen, NP  cephALEXin (KEFLEX) 500 MG capsule Take 1 capsule (500 mg total) by mouth 4 (four) times daily. 05/24/14   Eber Hong, MD  fluconazole (DIFLUCAN) 150 MG tablet 1 tab po x 1. May repeat in 72 hours if no improvement 06/19/14   Hayden Rasmussen, NP  HYDROcodone-acetaminophen (NORCO/VICODIN) 5-325 MG per tablet Take 1-2 tablets by mouth every 4 (four) hours as needed for moderate pain or severe pain. 05/18/14   Trixie Dredge, PA-C  ibuprofen (ADVIL,MOTRIN) 600 MG tablet Take 1 tablet (600 mg total) by mouth every 6 (six) hours. 10/15/13   Ignacia Felling, MD  metroNIDAZOLE (FLAGYL) 500 MG tablet Take 1  tablet (500 mg total) by mouth 2 (two) times daily. X 7 days 06/19/14   Hayden Rasmussen, NP   Ht 5\' 4"  (1.626 m)  Wt 205 lb 7 oz (93.186 kg)  BMI 35.25 kg/m2  LMP 12/30/2014  Breastfeeding? No Physical Exam  Constitutional: She appears well-developed and well-nourished.  HENT:  Head: Normocephalic and atraumatic.  The face is symmetrical bilateral with some swelling; no erythema or signs of trauma. Jaw; FROM. Recently removed 3rd molars on top and bottom; no signs of surrounding infection; discharge; or stitches; no palpable abscess or swelling along the gum line; the mouth is soft; tongue normal with  FROM; oropharynx is clear with no signs of sweling or edema. Small non infectious appearing ulcer to buccal mucosa   Eyes: Conjunctivae are normal. Right eye exhibits no discharge. Left eye exhibits no discharge.  Neck: Normal range of motion. Neck supple.  Supple FROM  Pulmonary/Chest: Effort normal. No respiratory distress.  Neurological: She is alert. Coordination normal.  Skin: Skin is warm and dry. No rash noted. She is not diaphoretic. No erythema.  Psychiatric: She has a normal mood and affect.  Nursing note and vitals reviewed.   ED Course  Procedures   Labs Review Labs Reviewed - No data to display  Imaging Review No results found.   EKG Interpretation None      MDM   Final diagnoses:  Wisdom teeth extracted, unspecified edentulism   Labs: N/A  Imaging: N/A  Consults:N/A  Therapeutics: N/A  Discharge Meds:   Assessment/Plan: No significant findings, or signs of infection that would indicate further evaluation or management here in the ED setting. This is likely a postsurgical inflammatory reaction. Discussed with pt to follow post procedure care instructions given to her by her oral surgeon. Advised pt to contact dentist tomorrow to follow up for her complaint and to continue taking antibiotic course as prescribed and ibuprofen as needed. Strict return precautions given, verbalizes understanding and agreement to today's plan with no further questions concerns.  I personally performed the services described in this documentation, which was scribed in my presence. The recorded information has been reviewed and is accurate.    Eyvonne Mechanic, PA-C 01/07/15 1644  Elwin Mocha, MD 01/08/15 [phone]

## 2015-01-07 NOTE — ED Notes (Signed)
PT walked out with d/C papers.

## 2015-01-07 NOTE — ED Notes (Signed)
Pt reports swelling to Rt and LT side of face due to wisdom teeth removal on 01-01-15

## 2015-01-07 NOTE — Discharge Instructions (Signed)
Dental Care and Dentist Visits Dental care supports good overall health. Regular dental visits can also help you avoid dental pain, bleeding, infection, and other more serious health problems in the future. It is important to keep the mouth healthy because diseases in the teeth, gums, and other oral tissues can spread to other areas of the body. Some problems, such as diabetes, heart disease, and pre-term labor have been associated with poor oral health.  See your dentist every 6 months. If you experience emergency problems such as a toothache or broken tooth, go to the dentist right away. If you see your dentist regularly, you may catch problems early. It is easier to be treated for problems in the early stages.  WHAT TO EXPECT AT A DENTIST VISIT  Your dentist will look for many common oral health problems and recommend proper treatment. At your regular dental visit, you can expect:  Gentle cleaning of the teeth and gums. This includes scraping and polishing. This helps to remove the sticky substance around the teeth and gums (plaque). Plaque forms in the mouth shortly after eating. Over time, plaque hardens on the teeth as tartar. If tartar is not removed regularly, it can cause problems. Cleaning also helps remove stains.  Periodic X-rays. These pictures of the teeth and supporting bone will help your dentist assess the health of your teeth.  Periodic fluoride treatments. Fluoride is a natural mineral shown to help strengthen teeth. Fluoride treatmentinvolves applying a fluoride gel or varnish to the teeth. It is most commonly done in children.  Examination of the mouth, tongue, jaws, teeth, and gums to look for any oral health problems, such as:  Cavities (dental caries). This is decay on the tooth caused by plaque, sugar, and acid in the mouth. It is best to catch a cavity when it is small.  Inflammation of the gums caused by plaque buildup (gingivitis).  Problems with the mouth or malformed  or misaligned teeth.  Oral cancer or other diseases of the soft tissues or jaws. KEEP YOUR TEETH AND GUMS HEALTHY For healthy teeth and gums, follow these general guidelines as well as your dentist's specific advice:  Have your teeth professionally cleaned at the dentist every 6 months.  Brush twice daily with a fluoride toothpaste.  Floss your teeth daily.  Ask your dentist if you need fluoride supplements, treatments, or fluoride toothpaste.  Eat a healthy diet. Reduce foods and drinks with added sugar.  Avoid smoking. TREATMENT FOR ORAL HEALTH PROBLEMS If you have oral health problems, treatment varies depending on the conditions present in your teeth and gums.  Your caregiver will most likely recommend good oral hygiene at each visit.  For cavities, gingivitis, or other oral health disease, your caregiver will perform a procedure to treat the problem. This is typically done at a separate appointment. Sometimes your caregiver will refer you to another dental specialist for specific tooth problems or for surgery. SEEK IMMEDIATE DENTAL CARE IF:  You have pain, bleeding, or soreness in the gum, tooth, jaw, or mouth area.  A permanent tooth becomes loose or separated from the gum socket.  You experience a blow or injury to the mouth or jaw area. Document Released: 02/05/2011 Document Revised: 08/18/2011 Document Reviewed: 02/05/2011 [facility] Patient Information 2015 [place], [place]. This information is not intended to replace advice given to you by your health care provider. Make sure you discuss any questions you have with your health care provider.  Pleas follow-up with dentist for further evaluation. Please  continue using antibiotics, pain medication, anti-inflammatories as previously prescribed.

## 2015-02-21 ENCOUNTER — Ambulatory Visit (INDEPENDENT_AMBULATORY_CARE_PROVIDER_SITE_OTHER): Payer: Medicaid Other | Admitting: Obstetrics & Gynecology

## 2015-02-21 ENCOUNTER — Encounter: Payer: Self-pay | Admitting: Obstetrics & Gynecology

## 2015-02-21 ENCOUNTER — Other Ambulatory Visit (HOSPITAL_COMMUNITY)
Admission: RE | Admit: 2015-02-21 | Discharge: 2015-02-21 | Disposition: A | Discharge status: Home / Self Care | Payer: Medicaid Other | Source: Ambulatory Visit | Attending: Obstetrics & Gynecology | Admitting: Obstetrics & Gynecology

## 2015-02-21 VITALS — BP 120/77 | HR 67 | Temp 98.6°F | Ht 65.0 in | Wt 211.4 lb

## 2015-02-21 DIAGNOSIS — Z01411 Encounter for gynecological examination (general) (routine) with abnormal findings: Secondary | ICD-10-CM | POA: Insufficient documentation

## 2015-02-21 DIAGNOSIS — Z113 Encounter for screening for infections with a predominantly sexual mode of transmission: Secondary | ICD-10-CM | POA: Diagnosis not present

## 2015-02-21 DIAGNOSIS — Z118 Encounter for screening for other infectious and parasitic diseases: Secondary | ICD-10-CM

## 2015-02-21 DIAGNOSIS — R87810 Cervical high risk human papillomavirus (HPV) DNA test positive: Secondary | ICD-10-CM

## 2015-02-21 DIAGNOSIS — Z124 Encounter for screening for malignant neoplasm of cervix: Secondary | ICD-10-CM

## 2015-02-21 DIAGNOSIS — Z1151 Encounter for screening for human papillomavirus (HPV): Secondary | ICD-10-CM | POA: Diagnosis not present

## 2015-02-21 DIAGNOSIS — R87610 Atypical squamous cells of undetermined significance on cytologic smear of cervix (ASC-US): Secondary | ICD-10-CM

## 2015-02-21 DIAGNOSIS — Z3202 Encounter for pregnancy test, result negative: Secondary | ICD-10-CM | POA: Diagnosis not present

## 2015-02-21 DIAGNOSIS — Z01419 Encounter for gynecological examination (general) (routine) without abnormal findings: Secondary | ICD-10-CM

## 2015-02-21 HISTORY — DX: Atypical squamous cells of undetermined significance on cytologic smear of cervix (ASC-US): R87.610

## 2015-02-21 LAB — POCT PREGNANCY, URINE
PREG TEST UR: NEGATIVE
Preg Test, Ur: NEGATIVE

## 2015-02-21 NOTE — Progress Notes (Signed)
Subjective:    Cheyenne Walton is a 24 y.o. female who presents for an annual exam. The patient has no complaints today. The patient is sexually active. GYN screening history: last pap: was normal. The patient wears seatbelts: yes. The patient participates in regular exercise: no. Has the patient ever been transfused or tattooed?: not asked. The patient reports that there is not domestic violence in her life.   Would like to be screened for STDs today.   Menstrual History: OB History    Gravida Para Term Preterm AB TAB SAB Ectopic Multiple Living   0 5 0 4 1 0 1       Patient's last menstrual period was 01/28/2015 (exact date).    The following portions of the patient's history were reviewed and updated as appropriate: allergies, current medications, past family history, past medical history, past social history, past surgical history and problem list.  Review of Systems A comprehensive review of systems was negative.    Objective:    BP 120/77 mmHg  Pulse 67  Temp(Src) 98.6 F (37 C)  Ht  (1.651 m)  Wt 211 lb 6.4 oz (95.89 kg)  BMI 35.18 kg/m2  LMP 01/28/2015 (Exact Date)  Breastfeeding? No  General Appearance:    Alert, cooperative, no distress, appears stated age  Head:    Normocephalic, without obvious abnormality, atraumatic  Eyes:    PERRL, conjunctiva/corneas clear, EOM's intact, fundi    benign, both eyes  Ears:    Normal TM's and external ear canals, both ears  Nose:   Nares normal, septum midline, mucosa normal, no drainage    or sinus tenderness  Throat:   Lips, mucosa, and tongue normal; teeth and gums normal  Neck:   Supple, symmetrical, trachea midline, no adenopathy;    thyroid:  no enlargement/tenderness/nodules; no carotid   bruit or JVD  Back:     Symmetric, no curvature, ROM normal, no CVA tenderness  Lungs:     Clear to auscultation bilaterally, respirations unlabored  Chest Wall:    No tenderness or deformity   Heart:    Regular rate and  rhythm, S1 and S2 normal, no murmur, rub   or gallop  Breast Exam:    Deferred  Abdomen:     Soft, non-tender, bowel sounds active all four quadrants,    no masses, no organomegaly  Genitalia:    Normal female without lesion, discharge or tenderness  Rectal:    Not examined  Extremities:   Extremities normal, atraumatic, no cyanosis or edema  Pulses:   2+ and symmetric all extremities  Skin:   Skin color, texture, turgor normal, no rashes or lesions  Lymph nodes:   Cervical, supraclavicular, and axillary nodes normal  Neurologic:   CNII-XII intact, normal strength, sensation and reflexes    throughout  .    Assessment:    Healthy female exam.  Desires STD screening. UPT in clinic negative.    Plan:    Pap smear done. Will obtain GC/CT, HIV, and RPR per patient request. Follow up in 1 year.   Katina Degree. Jimmey Ralph, MD   Attestation of Attending Supervision of Resident: Evaluation and management procedures were performed by the [facility] Medicine Resident under my supervision.  I have reviewed the resident's note and chart, and I agree with the management and plan.  Jaynie Collins, MD, FACOG Attending Obstetrician & Gynecologist Faculty Practice, [facility]

## 2015-02-21 NOTE — Patient Instructions (Signed)
Preventive Care for Adults A healthy lifestyle and preventive care can promote health and wellness. Preventive health guidelines for women include the following key practices.  A routine yearly physical is a good way to check with your health care provider about your health and preventive screening. It is a chance to share any concerns and updates on your health and to receive a thorough exam.  Visit your dentist for a routine exam and preventive care every 6 months. Brush your teeth twice a day and floss once a day. Good oral hygiene prevents tooth decay and gum disease.  The frequency of eye exams is based on your age, health, family medical history, use of contact lenses, and other factors. Follow your health care provider's recommendations for frequency of eye exams.  Eat a healthy diet. Foods like vegetables, fruits, whole grains, low-fat dairy products, and lean protein foods contain the nutrients you need without too many calories. Decrease your intake of foods high in solid fats, added sugars, and salt. Eat the right amount of calories for you.Get information about a proper diet from your health care provider, if necessary.  Regular physical exercise is one of the most important things you can do for your health. Most adults should get at least 150 minutes of moderate-intensity exercise (any activity that increases your heart rate and causes you to sweat) each week. In addition, most adults need muscle-strengthening exercises on 2 or more days a week.  Maintain a healthy weight. The body mass index (BMI) is a screening tool to identify possible weight problems. It provides an estimate of body fat based on height and weight. Your health care provider can find your BMI and can help you achieve or maintain a healthy weight.For adults 20 years and older:  A BMI below 18.5 is considered underweight.  A BMI of 18.5 to 24.9 is normal.  A BMI of 25 to 29.9 is considered overweight.  A BMI of  30 and above is considered obese.  Maintain normal blood lipids and cholesterol levels by exercising and minimizing your intake of saturated fat. Eat a balanced diet with plenty of fruit and vegetables. Blood tests for lipids and cholesterol should begin at age 76 and be repeated every 5 years. If your lipid or cholesterol levels are high, you are over 50, or you are at high risk for heart disease, you may need your cholesterol levels checked more frequently.Ongoing high lipid and cholesterol levels should be treated with medicines if diet and exercise are not working.  If you smoke, find out from your health care provider how to quit. If you do not use tobacco, do not start.  Lung cancer screening is recommended for adults aged 22-80 years who are at high risk for developing lung cancer because of a history of smoking. A yearly low-dose CT scan of the lungs is recommended for people who have at least a 30-pack-year history of smoking and are a current smoker or have quit within the past 15 years. A pack year of smoking is smoking an average of 1 pack of cigarettes a day for 1 year (for example: 1 pack a day for 30 years or 2 packs a day for 15 years). Yearly screening should continue until the smoker has stopped smoking for at least 15 years. Yearly screening should be stopped for people who develop a health problem that would prevent them from having lung cancer treatment.  If you are pregnant, do not drink alcohol. If you are breastfeeding,  be very cautious about drinking alcohol. If you are not pregnant and choose to drink alcohol, do not have more than 1 drink per day. One drink is considered to be 12 ounces (355 mL) of beer, 5 ounces (148 mL) of wine, or 1.5 ounces (44 mL) of liquor.  Avoid use of street drugs. Do not share needles with anyone. Ask for help if you need support or instructions about stopping the use of drugs.  High blood pressure causes heart disease and increases the risk of  stroke. Your blood pressure should be checked at least every 1 to 2 years. Ongoing high blood pressure should be treated with medicines if weight loss and exercise do not work.  If you are 3-86 years old, ask your health care provider if you should take aspirin to prevent strokes.  Diabetes screening involves taking a blood sample to check your fasting blood sugar level. This should be done once every 3 years, after age 67, if you are within normal weight and without risk factors for diabetes. Testing should be considered at a younger age or be carried out more frequently if you are overweight and have at least 1 risk factor for diabetes.  Breast cancer screening is essential preventive care for women. You should practice "breast self-awareness." This means understanding the normal appearance and feel of your breasts and may include breast self-examination. Any changes detected, no matter how small, should be reported to a health care provider. Women in their 8s and 30s should have a clinical breast exam (CBE) by a health care provider as part of a regular health exam every 1 to 3 years. After age 70, women should have a CBE every year. Starting at age 25, women should consider having a mammogram (breast X-ray test) every year. Women who have a family history of breast cancer should talk to their health care provider about genetic screening. Women at a high risk of breast cancer should talk to their health care providers about having an MRI and a mammogram every year.  Breast cancer gene (BRCA)-related cancer risk assessment is recommended for women who have family members with BRCA-related cancers. BRCA-related cancers include breast, ovarian, tubal, and peritoneal cancers. Having family members with these cancers may be associated with an increased risk for harmful changes (mutations) in the breast cancer genes BRCA1 and BRCA2. Results of the assessment will determine the need for genetic counseling and  BRCA1 and BRCA2 testing.  Routine pelvic exams to screen for cancer are no longer recommended for nonpregnant women who are considered low risk for cancer of the pelvic organs (ovaries, uterus, and vagina) and who do not have symptoms. Ask your health care provider if a screening pelvic exam is right for you.  If you have had past treatment for cervical cancer or a condition that could lead to cancer, you need Pap tests and screening for cancer for at least 20 years after your treatment. If Pap tests have been discontinued, your risk factors (such as having a new sexual partner) need to be reassessed to determine if screening should be resumed. Some women have medical problems that increase the chance of getting cervical cancer. In these cases, your health care provider may recommend more frequent screening and Pap tests.  The HPV test is an additional test that may be used for cervical cancer screening. The HPV test looks for the virus that can cause the cell changes on the cervix. The cells collected during the Pap test can be  tested for HPV. The HPV test could be used to screen women aged 30 years and older, and should be used in women of any age who have unclear Pap test results. After the age of 30, women should have HPV testing at the same frequency as a Pap test.  Colorectal cancer can be detected and often prevented. Most routine colorectal cancer screening begins at the age of 50 years and continues through age 75 years. However, your health care provider may recommend screening at an earlier age if you have risk factors for colon cancer. On a yearly basis, your health care provider may provide home test kits to check for hidden blood in the stool. Use of a small camera at the end of a tube, to directly examine the colon (sigmoidoscopy or colonoscopy), can detect the earliest forms of colorectal cancer. Talk to your health care provider about this at age 50, when routine screening begins. Direct  exam of the colon should be repeated every 5-10 years through age 75 years, unless early forms of pre-cancerous polyps or small growths are found.  People who are at an increased risk for hepatitis B should be screened for this virus. You are considered at high risk for hepatitis B if:  You were born in a country where hepatitis B occurs often. Talk with your health care provider about which countries are considered high risk.  Your parents were born in a high-risk country and you have not received a shot to protect against hepatitis B (hepatitis B vaccine).  You have HIV or AIDS.  You use needles to inject street drugs.  You live with, or have sex with, someone who has hepatitis B.  You get hemodialysis treatment.  You take certain medicines for conditions like cancer, organ transplantation, and autoimmune conditions.  Hepatitis C blood testing is recommended for all people born from 1945 through 1965 and any individual with known risks for hepatitis C.  Practice safe sex. Use condoms and avoid high-risk sexual practices to reduce the spread of sexually transmitted infections (STIs). STIs include gonorrhea, chlamydia, syphilis, trichomonas, herpes, HPV, and human immunodeficiency virus (HIV). Herpes, HIV, and HPV are viral illnesses that have no cure. They can result in disability, cancer, and death.  You should be screened for sexually transmitted illnesses (STIs) including gonorrhea and chlamydia if:  You are sexually active and are younger than 24 years.  You are older than 24 years and your health care provider tells you that you are at risk for this type of infection.  Your sexual activity has changed since you were last screened and you are at an increased risk for chlamydia or gonorrhea. Ask your health care provider if you are at risk.  If you are at risk of being infected with HIV, it is recommended that you take a prescription medicine daily to prevent HIV infection. This is  called preexposure prophylaxis (PrEP). You are considered at risk if:  You are a heterosexual woman, are sexually active, and are at increased risk for HIV infection.  You take drugs by injection.  You are sexually active with a partner who has HIV.  Talk with your health care provider about whether you are at high risk of being infected with HIV. If you choose to begin PrEP, you should first be tested for HIV. You should then be tested every 3 months for as long as you are taking PrEP.  Osteoporosis is a disease in which the bones lose minerals and strength   with aging. This can result in serious bone fractures or breaks. The risk of osteoporosis can be identified using a bone density scan. Women ages 65 years and over and women at risk for fractures or osteoporosis should discuss screening with their health care providers. Ask your health care provider whether you should take a calcium supplement or vitamin D to reduce the rate of osteoporosis.  Menopause can be associated with physical symptoms and risks. Hormone replacement therapy is available to decrease symptoms and risks. You should talk to your health care provider about whether hormone replacement therapy is right for you.  Use sunscreen. Apply sunscreen liberally and repeatedly throughout the day. You should seek shade when your shadow is shorter than you. Protect yourself by wearing long sleeves, pants, a wide-brimmed hat, and sunglasses year round, whenever you are outdoors.  Once a month, do a whole body skin exam, using a mirror to look at the skin on your back. Tell your health care provider of new moles, moles that have irregular borders, moles that are larger than a pencil eraser, or moles that have changed in shape or color.  Stay current with required vaccines (immunizations).  Influenza vaccine. All adults should be immunized every year.  Tetanus, diphtheria, and acellular pertussis (Td, Tdap) vaccine. Pregnant women should  receive 1 dose of Tdap vaccine during each pregnancy. The dose should be obtained regardless of the length of time since the last dose. Immunization is preferred during the 27th-36th week of gestation. An adult who has not previously received Tdap or who does not know her vaccine status should receive 1 dose of Tdap. This initial dose should be followed by tetanus and diphtheria toxoids (Td) booster doses every 10 years. Adults with an unknown or incomplete history of completing a 3-dose immunization series with Td-containing vaccines should begin or complete a primary immunization series including a Tdap dose. Adults should receive a Td booster every 10 years.  Varicella vaccine. An adult without evidence of immunity to varicella should receive 2 doses or a second dose if she has previously received 1 dose. Pregnant females who do not have evidence of immunity should receive the first dose after pregnancy. This first dose should be obtained before leaving the health care facility. The second dose should be obtained 4-8 weeks after the first dose.  Human papillomavirus (HPV) vaccine. Females aged 13-26 years who have not received the vaccine previously should obtain the 3-dose series. The vaccine is not recommended for use in pregnant females. However, pregnancy testing is not needed before receiving a dose. If a female is found to be pregnant after receiving a dose, no treatment is needed. In that case, the remaining doses should be delayed until after the pregnancy. Immunization is recommended for any person with an immunocompromised condition through the age of 26 years if she did not get any or all doses earlier. During the 3-dose series, the second dose should be obtained 4-8 weeks after the first dose. The third dose should be obtained 24 weeks after the first dose and 16 weeks after the second dose.  Zoster vaccine. One dose is recommended for adults aged 60 years or older unless certain conditions are  present.  Measles, mumps, and rubella (MMR) vaccine. Adults born before 1957 generally are considered immune to measles and mumps. Adults born in 1957 or later should have 1 or more doses of MMR vaccine unless there is a contraindication to the vaccine or there is laboratory evidence of immunity to   each of the three diseases. A routine second dose of MMR vaccine should be obtained at least 28 days after the first dose for students attending postsecondary schools, health care workers, or international travelers. People who received inactivated measles vaccine or an unknown type of measles vaccine during 1963-1967 should receive 2 doses of MMR vaccine. People who received inactivated mumps vaccine or an unknown type of mumps vaccine before 1979 and are at high risk for mumps infection should consider immunization with 2 doses of MMR vaccine. For females of childbearing age, rubella immunity should be determined. If there is no evidence of immunity, females who are not pregnant should be vaccinated. If there is no evidence of immunity, females who are pregnant should delay immunization until after pregnancy. Unvaccinated health care workers born before 1957 who lack laboratory evidence of measles, mumps, or rubella immunity or laboratory confirmation of disease should consider measles and mumps immunization with 2 doses of MMR vaccine or rubella immunization with 1 dose of MMR vaccine.  Pneumococcal 13-valent conjugate (PCV13) vaccine. When indicated, a person who is uncertain of her immunization history and has no record of immunization should receive the PCV13 vaccine. An adult aged 19 years or older who has certain medical conditions and has not been previously immunized should receive 1 dose of PCV13 vaccine. This PCV13 should be followed with a dose of pneumococcal polysaccharide (PPSV23) vaccine. The PPSV23 vaccine dose should be obtained at least 8 weeks after the dose of PCV13 vaccine. An adult aged 19  years or older who has certain medical conditions and previously received 1 or more doses of PPSV23 vaccine should receive 1 dose of PCV13. The PCV13 vaccine dose should be obtained 1 or more years after the last PPSV23 vaccine dose.  Pneumococcal polysaccharide (PPSV23) vaccine. When PCV13 is also indicated, PCV13 should be obtained first. All adults aged 65 years and older should be immunized. An adult younger than age 65 years who has certain medical conditions should be immunized. Any person who resides in a nursing home or long-term care facility should be immunized. An adult smoker should be immunized. People with an immunocompromised condition and certain other conditions should receive both PCV13 and PPSV23 vaccines. People with human immunodeficiency virus (HIV) infection should be immunized as soon as possible after diagnosis. Immunization during chemotherapy or radiation therapy should be avoided. Routine use of PPSV23 vaccine is not recommended for American Indians, [place] Natives, or people younger than 65 years unless there are medical conditions that require PPSV23 vaccine. When indicated, people who have unknown immunization and have no record of immunization should receive PPSV23 vaccine. One-time revaccination 5 years after the first dose of PPSV23 is recommended for people aged 19-64 years who have chronic kidney failure, nephrotic syndrome, asplenia, or immunocompromised conditions. People who received 1-2 doses of PPSV23 before age 65 years should receive another dose of PPSV23 vaccine at age 65 years or later if at least 5 years have passed since the previous dose. Doses of PPSV23 are not needed for people immunized with PPSV23 at or after age 65 years.  Meningococcal vaccine. Adults with asplenia or persistent complement component deficiencies should receive 2 doses of quadrivalent meningococcal conjugate (MenACWY-D) vaccine. The doses should be obtained at least 2 months apart.  Microbiologists working with certain meningococcal bacteria, military recruits, people at risk during an outbreak, and people who travel to or live in countries with a high rate of meningitis should be immunized. A first-year college student up through age   21 years who is living in a residence hall should receive a dose if she did not receive a dose on or after her 16th birthday. Adults who have certain high-risk conditions should receive one or more doses of vaccine.  Hepatitis A vaccine. Adults who wish to be protected from this disease, have certain high-risk conditions, work with hepatitis A-infected animals, work in hepatitis A research labs, or travel to or work in countries with a high rate of hepatitis A should be immunized. Adults who were previously unvaccinated and who anticipate close contact with an international adoptee during the first 60 days after arrival in the [place] from a country with a high rate of hepatitis A should be immunized.  Hepatitis B vaccine. Adults who wish to be protected from this disease, have certain high-risk conditions, may be exposed to blood or other infectious body fluids, are household contacts or sex partners of hepatitis B positive people, are clients or workers in certain care facilities, or travel to or work in countries with a high rate of hepatitis B should be immunized.  Haemophilus influenzae type b (Hib) vaccine. A previously unvaccinated person with asplenia or sickle cell disease or having a scheduled splenectomy should receive 1 dose of Hib vaccine. Regardless of previous immunization, a recipient of a hematopoietic stem cell transplant should receive a 3-dose series 6-12 months after her successful transplant. Hib vaccine is not recommended for adults with HIV infection. Preventive Services / Frequency Ages 64 to 68 years  Blood pressure check.** / Every 1 to 2 years.  Lipid and cholesterol check.** / Every 5 years beginning at age  22.  Clinical breast exam.** / Every 3 years for women in their 88s and 53s.  BRCA-related cancer risk assessment.** / For women who have family members with a BRCA-related cancer (breast, ovarian, tubal, or peritoneal cancers).  Pap test.** / Every 2 years from ages 90 through 51. Every 3 years starting at age 21 through age 56 or 3 with a history of 3 consecutive normal Pap tests.  HPV screening.** / Every 3 years from ages 24 through ages 1 to 46 with a history of 3 consecutive normal Pap tests.  Hepatitis C blood test.** / For any individual with known risks for hepatitis C.  Skin self-exam. / Monthly.  Influenza vaccine. / Every year.  Tetanus, diphtheria, and acellular pertussis (Tdap, Td) vaccine.** / Consult your health care provider. Pregnant women should receive 1 dose of Tdap vaccine during each pregnancy. 1 dose of Td every 10 years.  Varicella vaccine.** / Consult your health care provider. Pregnant females who do not have evidence of immunity should receive the first dose after pregnancy.  HPV vaccine. / 3 doses over 6 months, if 72 and younger. The vaccine is not recommended for use in pregnant females. However, pregnancy testing is not needed before receiving a dose.  Measles, mumps, rubella (MMR) vaccine.** / You need at least 1 dose of MMR if you were born in 1957 or later. You may also need a 2nd dose. For females of childbearing age, rubella immunity should be determined. If there is no evidence of immunity, females who are not pregnant should be vaccinated. If there is no evidence of immunity, females who are pregnant should delay immunization until after pregnancy.  Pneumococcal 13-valent conjugate (PCV13) vaccine.** / Consult your health care provider.  Pneumococcal polysaccharide (PPSV23) vaccine.** / 1 to 2 doses if you smoke cigarettes or if you have certain conditions.  Meningococcal vaccine.** /  1 dose if you are age 19 to 21 years and a first-year college  student living in a residence hall, or have one of several medical conditions, you need to get vaccinated against meningococcal disease. You may also need additional booster doses.  Hepatitis A vaccine.** / Consult your health care provider.  Hepatitis B vaccine.** / Consult your health care provider.  Haemophilus influenzae type b (Hib) vaccine.** / Consult your health care provider. Ages 40 to 64 years  Blood pressure check.** / Every 1 to 2 years.  Lipid and cholesterol check.** / Every 5 years beginning at age 20 years.  Lung cancer screening. / Every year if you are aged 55-80 years and have a 30-pack-year history of smoking and currently smoke or have quit within the past 15 years. Yearly screening is stopped once you have quit smoking for at least 15 years or develop a health problem that would prevent you from having lung cancer treatment.  Clinical breast exam.** / Every year after age 40 years.  BRCA-related cancer risk assessment.** / For women who have family members with a BRCA-related cancer (breast, ovarian, tubal, or peritoneal cancers).  Mammogram.** / Every year beginning at age 40 years and continuing for as long as you are in good health. Consult with your health care provider.  Pap test.** / Every 3 years starting at age 30 years through age 65 or 70 years with a history of 3 consecutive normal Pap tests.  HPV screening.** / Every 3 years from ages 30 years through ages 65 to 70 years with a history of 3 consecutive normal Pap tests.  Fecal occult blood test (FOBT) of stool. / Every year beginning at age 50 years and continuing until age 75 years. You may not need to do this test if you get a colonoscopy every 10 years.  Flexible sigmoidoscopy or colonoscopy.** / Every 5 years for a flexible sigmoidoscopy or every 10 years for a colonoscopy beginning at age 50 years and continuing until age 75 years.  Hepatitis C blood test.** / For all people born from 1945 through  1965 and any individual with known risks for hepatitis C.  Skin self-exam. / Monthly.  Influenza vaccine. / Every year.  Tetanus, diphtheria, and acellular pertussis (Tdap/Td) vaccine.** / Consult your health care provider. Pregnant women should receive 1 dose of Tdap vaccine during each pregnancy. 1 dose of Td every 10 years.  Varicella vaccine.** / Consult your health care provider. Pregnant females who do not have evidence of immunity should receive the first dose after pregnancy.  Zoster vaccine.** / 1 dose for adults aged 60 years or older.  Measles, mumps, rubella (MMR) vaccine.** / You need at least 1 dose of MMR if you were born in 1957 or later. You may also need a 2nd dose. For females of childbearing age, rubella immunity should be determined. If there is no evidence of immunity, females who are not pregnant should be vaccinated. If there is no evidence of immunity, females who are pregnant should delay immunization until after pregnancy.  Pneumococcal 13-valent conjugate (PCV13) vaccine.** / Consult your health care provider.  Pneumococcal polysaccharide (PPSV23) vaccine.** / 1 to 2 doses if you smoke cigarettes or if you have certain conditions.  Meningococcal vaccine.** / Consult your health care provider.  Hepatitis A vaccine.** / Consult your health care provider.  Hepatitis B vaccine.** / Consult your health care provider.  Haemophilus influenzae type b (Hib) vaccine.** / Consult your health care provider. Ages 65   years and over  Blood pressure check.** / Every 1 to 2 years.  Lipid and cholesterol check.** / Every 5 years beginning at age 22 years.  Lung cancer screening. / Every year if you are aged 73-80 years and have a 30-pack-year history of smoking and currently smoke or have quit within the past 15 years. Yearly screening is stopped once you have quit smoking for at least 15 years or develop a health problem that would prevent you from having lung cancer  treatment.  Clinical breast exam.** / Every year after age 4 years.  BRCA-related cancer risk assessment.** / For women who have family members with a BRCA-related cancer (breast, ovarian, tubal, or peritoneal cancers).  Mammogram.** / Every year beginning at age 40 years and continuing for as long as you are in good health. Consult with your health care provider.  Pap test.** / Every 3 years starting at age 9 years through age 34 or 91 years with 3 consecutive normal Pap tests. Testing can be stopped between 65 and 70 years with 3 consecutive normal Pap tests and no abnormal Pap or HPV tests in the past 10 years.  HPV screening.** / Every 3 years from ages 57 years through ages 64 or 45 years with a history of 3 consecutive normal Pap tests. Testing can be stopped between 65 and 70 years with 3 consecutive normal Pap tests and no abnormal Pap or HPV tests in the past 10 years.  Fecal occult blood test (FOBT) of stool. / Every year beginning at age 15 years and continuing until age 17 years. You may not need to do this test if you get a colonoscopy every 10 years.  Flexible sigmoidoscopy or colonoscopy.** / Every 5 years for a flexible sigmoidoscopy or every 10 years for a colonoscopy beginning at age 86 years and continuing until age 71 years.  Hepatitis C blood test.** / For all people born from [birth year] through 1964 and any individual with known risks for hepatitis C.  Osteoporosis screening.** / A one-time screening for women ages 83 years and over and women at risk for fractures or osteoporosis.  Skin self-exam. / Monthly.  Influenza vaccine. / Every year.  Tetanus, diphtheria, and acellular pertussis (Tdap/Td) vaccine.** / 1 dose of Td every 10 years.  Varicella vaccine.** / Consult your health care provider.  Zoster vaccine.** / 1 dose for adults aged 61 years or older.  Pneumococcal 13-valent conjugate (PCV13) vaccine.** / Consult your health care provider.  Pneumococcal  polysaccharide (PPSV23) vaccine.** / 1 dose for all adults aged 28 years and older.  Meningococcal vaccine.** / Consult your health care provider.  Hepatitis A vaccine.** / Consult your health care provider.  Hepatitis B vaccine.** / Consult your health care provider.  Haemophilus influenzae type b (Hib) vaccine.** / Consult your health care provider. ** Family history and personal history of risk and conditions may change your health care provider's recommendations. Document Released: 07/22/2001 Document Revised: 10/10/2013 Document Reviewed: 10/21/2010 [facility] Patient Information 2015 [place], [place]. This information is not intended to replace advice given to you by your health care provider. Make sure you discuss any questions you have with your health care provider.

## 2015-02-22 LAB — RPR

## 2015-02-22 LAB — HIV ANTIBODY (ROUTINE TESTING W REFLEX): HIV: NONREACTIVE

## 2015-02-22 LAB — CYTOLOGY - PAP

## 2015-02-26 IMAGING — US US PELVIS COMPLETE
1 series · 13 of 25 positions shown · non-contrast
Comparison: Ob ultrasounds 05/13/2013 and earlier

CLINICAL DATA: 23-year-old female with pelvic pain in female. Right
lower quadrant abdominal pain. Right adnexal tenderness. Initial
encounter.

EXAM:
TRANSABDOMINAL AND TRANSVAGINAL ULTRASOUND OF PELVIS
DOPPLER ULTRASOUND OF OVARIES
TECHNIQUE: Both transabdominal and transvaginal ultrasound examinations of the
pelvis were performed. Transabdominal technique was performed for
global imaging of the pelvis including uterus, ovaries, adnexal
regions, and pelvic cul-de-sac.
It was necessary to proceed with endovaginal exam following the
transabdominal exam to visualize the ovaries. Color and duplex
Doppler ultrasound was utilized to evaluate blood flow to the
ovaries.

[Series 1: us pelvis complete · 0.24mm/px · 13 of 84 slices shown]
[im 1/84]
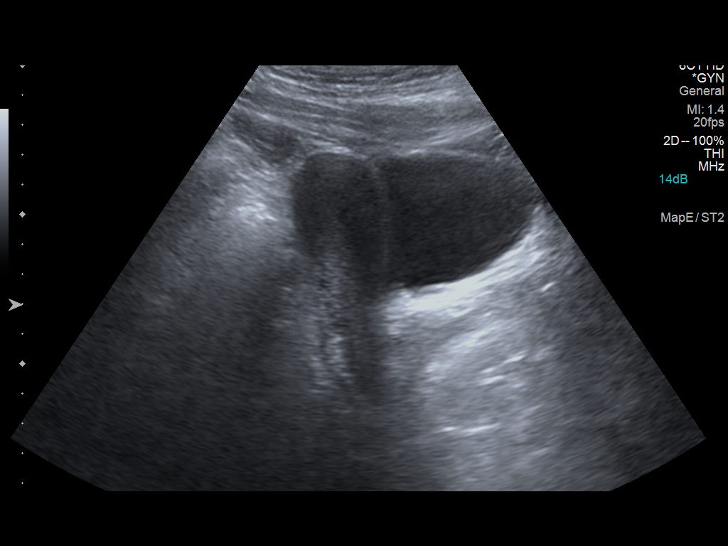
[im 7/84]
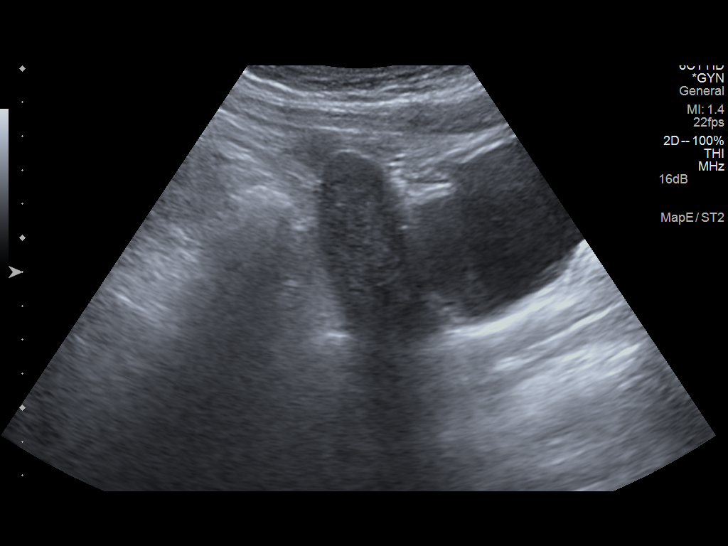
[im 14/84]
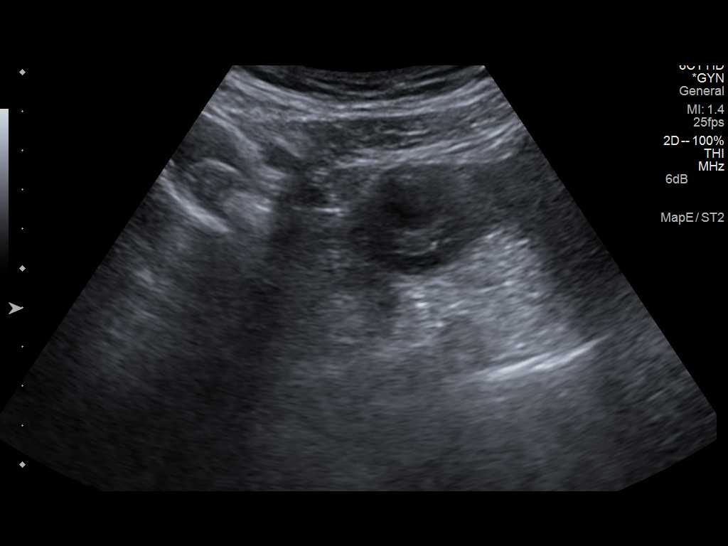
[im 21/84]
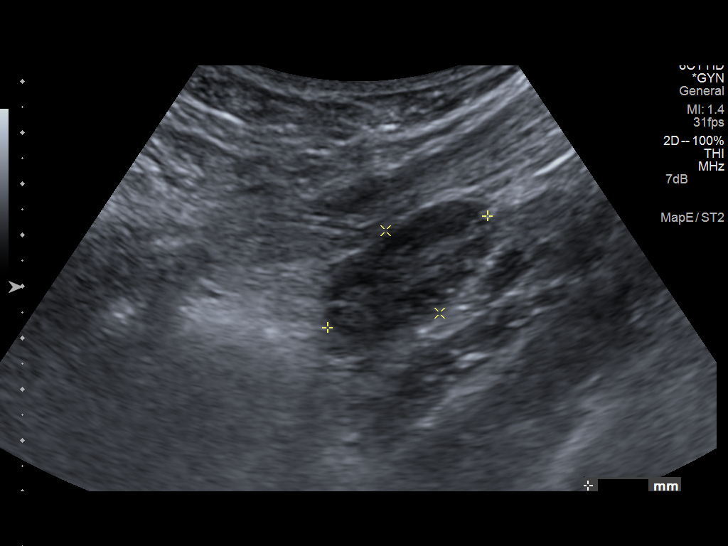
[im 28/84]
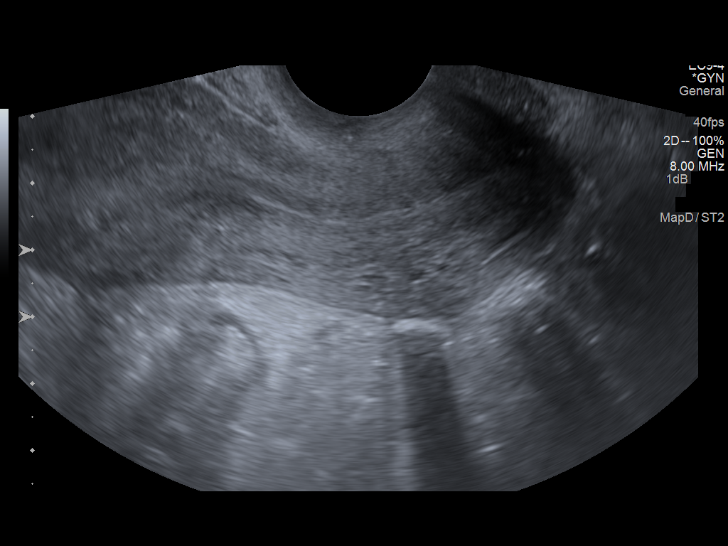
[im 35/84]
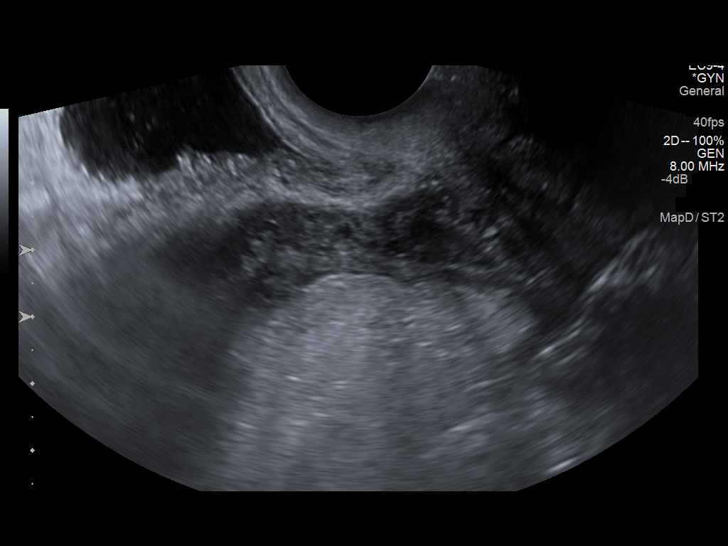
[im 42/84]
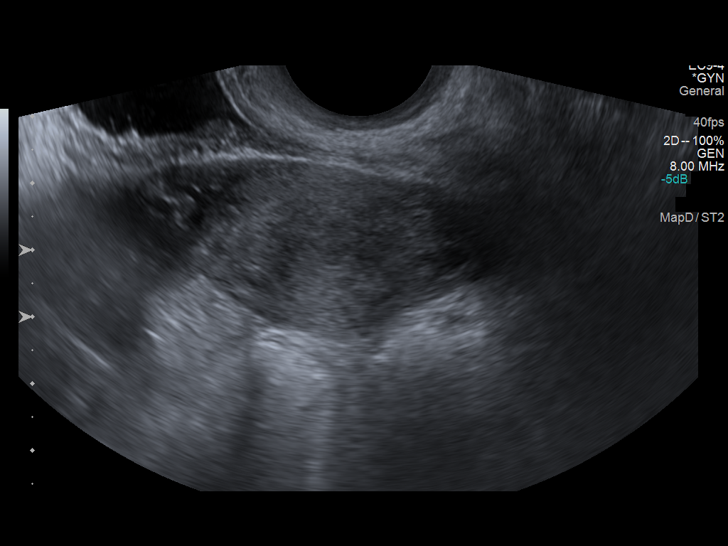
[im 49/84]
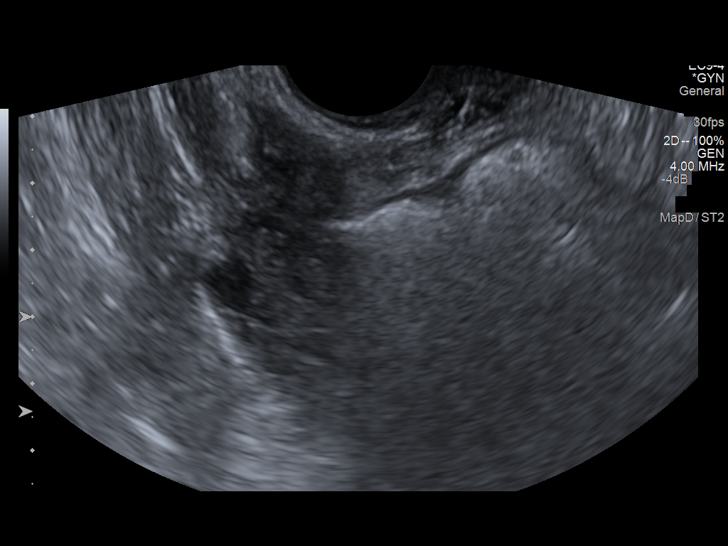
[im 56/84]
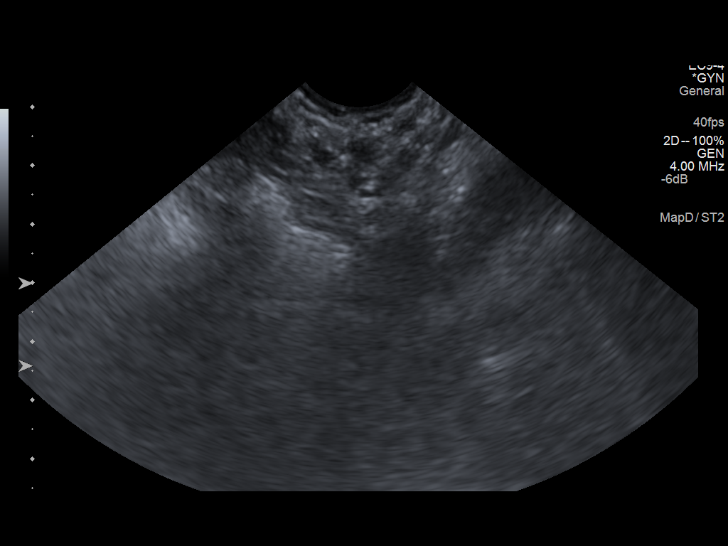
[im 63/84]
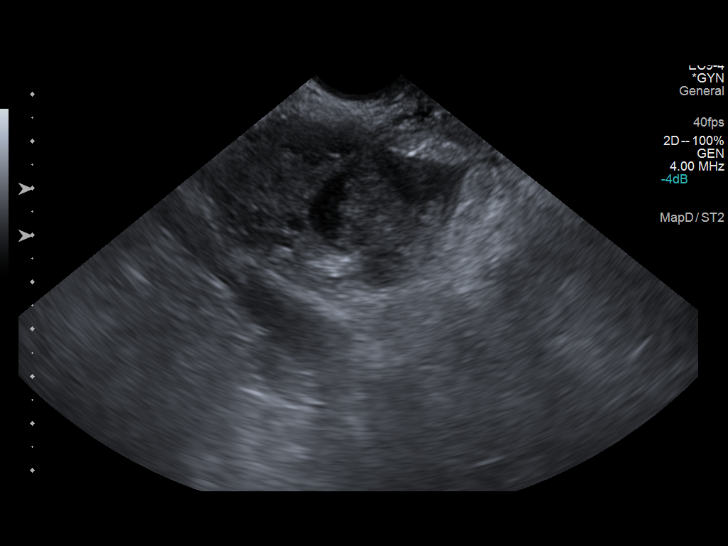
[im 70/84]
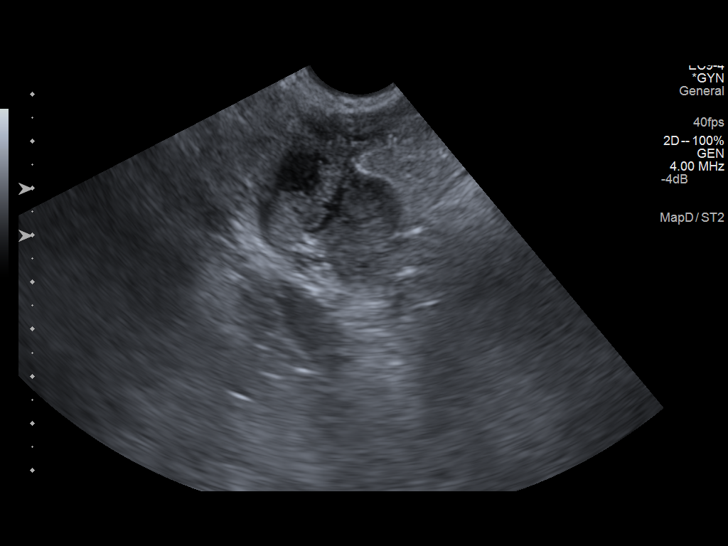
[im 77/84]
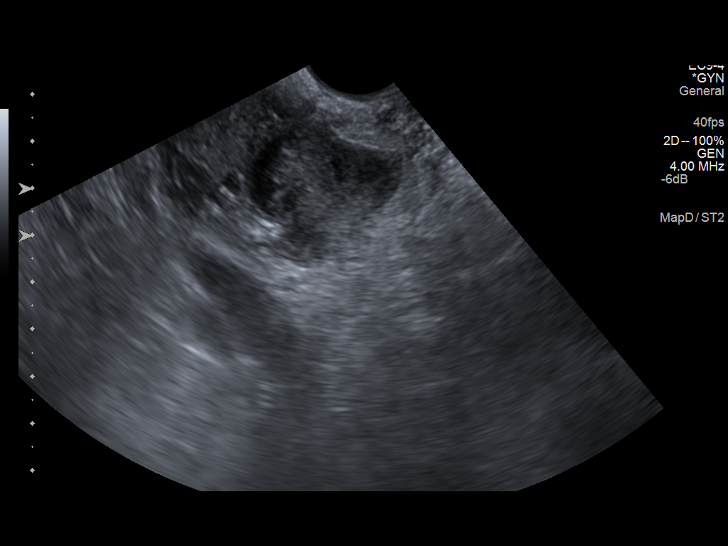
[im 84/84]
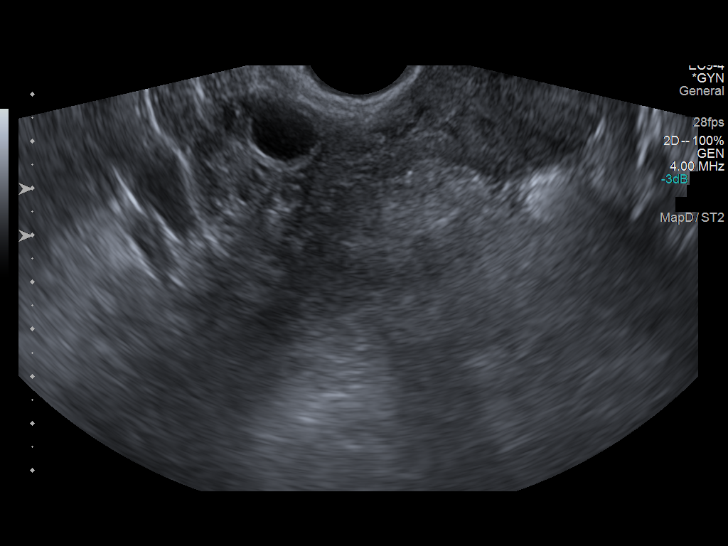

[13 of 25 positions shown; findings below may reference images not displayed]

FINDINGS: Uterus

Measurements: 8.2 x 3.4 x 4.1 cm. No fibroids or other mass
visualized.

Endometrium

Thickness: 6 mm.  No focal abnormality visualized.

Right ovary

Measurements: 4.9 x 3.8 x 3.3 cm. Small follicles. Small complex
area measuring up to 2.8 cm (image 70), with Doppler interrogation
some solid elements are suggested (image 79), but this may simply
relate to normal ovarian parenchyma.. Otherwise normal appearance

Left ovary

Measurements: 3.2 x 2.3 x 2.1 cm. Normal appearance/no adnexal mass.

Pulsed Doppler evaluation of both ovaries demonstrates normal
low-resistance arterial and venous waveforms.

Other findings

No free fluid.
IMPRESSION: 1. No evidence of ovarian torsion. Normal uterus. No pelvic free
fluid.
2. Small complex area in the right ovary measuring 2.8 cm, with
indeterminate but probably benign characteristics (suggestive of but
not classic for hemorrhagic cyst). Recommend 6-12 week follow-up
ultrasound to ensure resolution. If the lesion is unchanged, then
hemorrhagic cyst is unlikely and continued followup with either
ultrasound or MRI should be considered. This recommendation follows
the consensus statement: Management of Asymptomatic Ovarian and
Other Adnexal Cysts Imaged at US: Society of Radiologists in
Ultrasound Consensus Conference Statement. Radiology 1555;

## 2015-02-27 ENCOUNTER — Encounter: Payer: Self-pay | Admitting: Obstetrics & Gynecology

## 2015-03-02 ENCOUNTER — Telehealth: Payer: Self-pay | Admitting: *Deleted

## 2015-03-02 NOTE — Telephone Encounter (Signed)
Cheyenne Walton left a message this am she is calling to get test results.

## 2015-03-02 NOTE — Telephone Encounter (Addendum)
Per chart review results negative for rpr, hiv but pap test was AS-CUS with + HPV  9/27  1545  Called pt and informed her of test results (negative RPR, HIV, GC and Chlamydia). I also informed her of PAP results and need for repeat Pap in 1 year with co-testing.  Pt voiced understanding.  Diane Day RNC

## 2015-03-06 ENCOUNTER — Telehealth: Payer: Self-pay | Admitting: *Deleted

## 2015-03-06 NOTE — Telephone Encounter (Signed)
Received message left on nurse line 03/06/15 at 1457.  Patient states she is calling for results.  Requests a return call.

## 2015-03-12 NOTE — Telephone Encounter (Signed)
Contacted patient, results given with recommendation for cotesting in one year.  Pt verbalizes understanding.

## 2015-03-12 NOTE — Telephone Encounter (Signed)
Attempted to contact patient, no answer, left message for patient that we are returning your call to the clinic.

## 2015-07-18 ENCOUNTER — Ambulatory Visit: Payer: Self-pay | Admitting: Obstetrics and Gynecology

## 2015-07-18 LAB — POCT PREGNANCY, URINE: Preg Test, Ur: NEGATIVE

## 2015-07-30 ENCOUNTER — Emergency Department (HOSPITAL_COMMUNITY)
Admission: EM | Admit: 2015-07-30 | Discharge: 2015-07-30 | Disposition: A | Discharge status: Home / Self Care | Payer: Self-pay | Attending: Emergency Medicine | Admitting: Emergency Medicine

## 2015-07-30 ENCOUNTER — Encounter (HOSPITAL_COMMUNITY): Payer: Self-pay | Admitting: Emergency Medicine

## 2015-07-30 ENCOUNTER — Emergency Department (HOSPITAL_COMMUNITY): Payer: Self-pay

## 2015-07-30 DIAGNOSIS — Z91040 Latex allergy status: Secondary | ICD-10-CM | POA: Insufficient documentation

## 2015-07-30 DIAGNOSIS — Y9389 Activity, other specified: Secondary | ICD-10-CM | POA: Insufficient documentation

## 2015-07-30 DIAGNOSIS — Z87440 Personal history of urinary (tract) infections: Secondary | ICD-10-CM | POA: Insufficient documentation

## 2015-07-30 DIAGNOSIS — Y998 Other external cause status: Secondary | ICD-10-CM | POA: Insufficient documentation

## 2015-07-30 DIAGNOSIS — Y9289 Other specified places as the place of occurrence of the external cause: Secondary | ICD-10-CM | POA: Insufficient documentation

## 2015-07-30 DIAGNOSIS — S41112A Laceration without foreign body of left upper arm, initial encounter: Secondary | ICD-10-CM

## 2015-07-30 DIAGNOSIS — J45909 Unspecified asthma, uncomplicated: Secondary | ICD-10-CM | POA: Insufficient documentation

## 2015-07-30 DIAGNOSIS — Z87891 Personal history of nicotine dependence: Secondary | ICD-10-CM | POA: Insufficient documentation

## 2015-07-30 DIAGNOSIS — X788XXA Intentional self-harm by other sharp object, initial encounter: Secondary | ICD-10-CM | POA: Insufficient documentation

## 2015-07-30 DIAGNOSIS — Z23 Encounter for immunization: Secondary | ICD-10-CM | POA: Insufficient documentation

## 2015-07-30 MED ORDER — AMOXICILLIN-POT CLAVULANATE 875-125 MG PO TABS: 1.0000 | ORAL_TABLET | Freq: Two times a day (BID) | ORAL | Status: DC

## 2015-07-30 MED ORDER — LIDOCAINE-EPINEPHRINE (PF) 2 %-1:200000 IJ SOLN
20.0000 mL | Freq: Once | INTRAMUSCULAR | Status: AC
  Administered 2015-07-30: 20 mL
  Filled 2015-07-30: qty 20

## 2015-07-30 MED ORDER — TETANUS-DIPHTH-ACELL PERTUSSIS 5-2.5-18.5 LF-MCG/0.5 IM SUSP
0.5000 mL | Freq: Once | INTRAMUSCULAR | Status: AC
  Administered 2015-07-30: 0.5 mL via INTRAMUSCULAR
  Filled 2015-07-30: qty 0.5
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
## 2015-07-30 NOTE — ED Notes (Signed)
Declined W/C at D/C and was escorted to lobby by RN. 

## 2015-07-30 NOTE — ED Notes (Signed)
Suture tray and suture cart at bedside.  

## 2015-07-30 NOTE — Discharge Instructions (Signed)
Delayed Wound Closure Sometimes, your health care provider will decide to delay closing a wound for several days. This is done when the wound is badly bruised, dirty, or when it has been several hours since the injury happened. By delaying the closure of your wound, the risk of infection is reduced. Wounds that are closed in 3-7 days after being cleaned up and dressed heal just as well as those that are closed right away. HOME CARE INSTRUCTIONS  Rest and elevate the injured area until the pain and swelling are gone.  Have your wound checked as instructed by your health care provider. SEEK MEDICAL CARE IF:  You develop unusual or increased swelling or redness around the wound.  You have increasing pain or tenderness.  There is increasing fluid (drainage) or a bad smelling drainage coming from the wound.   This information is not intended to replace advice given to you by your health care provider. Make sure you discuss any questions you have with your health care provider.   Follow up to this department in 7 days for suture removal. Take antibiotics as prescribed. Keep wound clean and dry. May wash with soap and water. Return to the emergency department sooner if you experience severe worsening of your symptoms,or swelling around the wound, fever, chills, numbness or tingling in extremity, inability to move your fingers.
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
## 2015-08-13 ENCOUNTER — Ambulatory Visit: Payer: Self-pay | Admitting: Obstetrics & Gynecology

## 2015-09-09 ENCOUNTER — Encounter (HOSPITAL_COMMUNITY): Payer: Self-pay | Admitting: Adult Health

## 2015-09-09 ENCOUNTER — Emergency Department (HOSPITAL_COMMUNITY)
Admission: EM | Admit: 2015-09-09 | Discharge: 2015-09-09 | Disposition: A | Discharge status: Home / Self Care | Payer: Self-pay | Attending: Emergency Medicine | Admitting: Emergency Medicine

## 2015-09-09 DIAGNOSIS — Z87891 Personal history of nicotine dependence: Secondary | ICD-10-CM | POA: Insufficient documentation

## 2015-09-09 DIAGNOSIS — Z3202 Encounter for pregnancy test, result negative: Secondary | ICD-10-CM | POA: Insufficient documentation

## 2015-09-09 DIAGNOSIS — J45909 Unspecified asthma, uncomplicated: Secondary | ICD-10-CM | POA: Insufficient documentation

## 2015-09-09 DIAGNOSIS — N760 Acute vaginitis: Secondary | ICD-10-CM | POA: Insufficient documentation

## 2015-09-09 DIAGNOSIS — Z91040 Latex allergy status: Secondary | ICD-10-CM | POA: Insufficient documentation

## 2015-09-09 DIAGNOSIS — B9689 Other specified bacterial agents as the cause of diseases classified elsewhere: Secondary | ICD-10-CM

## 2015-09-09 DIAGNOSIS — Z87440 Personal history of urinary (tract) infections: Secondary | ICD-10-CM | POA: Insufficient documentation

## 2015-09-09 LAB — CBC
HCT: 42.3 % (ref 36.0–46.0)
Hemoglobin: 14.3 g/dL (ref 12.0–15.0)
MCH: 32 pg (ref 26.0–34.0)
MCHC: 33.8 g/dL (ref 30.0–36.0)
MCV: 94.6 fL (ref 78.0–100.0)
PLATELETS: 278 10*3/uL (ref 150–400)
RBC: 4.47 MIL/uL (ref 3.87–5.11)
RDW: 13.9 % (ref 11.5–15.5)
WBC: 11.5 10*3/uL — ABNORMAL HIGH (ref 4.0–10.5)

## 2015-09-09 LAB — COMPREHENSIVE METABOLIC PANEL
ALT: 15 U/L (ref 14–54)
AST: 18 U/L (ref 15–41)
Albumin: 3.7 g/dL (ref 3.5–5.0)
Alkaline Phosphatase: 92 U/L (ref 38–126)
Anion gap: 8 (ref 5–15)
BUN: 5 mg/dL — ABNORMAL LOW (ref 6–20)
CHLORIDE: 106 mmol/L (ref 101–111)
CO2: 22 mmol/L (ref 22–32)
Calcium: 9.2 mg/dL (ref 8.9–10.3)
Creatinine, Ser: 0.85 mg/dL (ref 0.44–1.00)
GFR calc non Af Amer: >60 mL/min (ref >60)
Glucose, Bld: 99 mg/dL (ref 65–99)
Potassium: 3.5 mmol/L (ref 3.5–5.1)
Sodium: 136 mmol/L (ref 135–145)
Total Bilirubin: 0.5 mg/dL (ref 0.3–1.2)
Total Protein: 6.7 g/dL (ref 6.5–8.1)

## 2015-09-09 LAB — URINALYSIS, ROUTINE W REFLEX MICROSCOPIC
GLUCOSE, UA: NEGATIVE mg/dL
Glucose, UA: NEGATIVE mg/dL
HGB URINE DIPSTICK: NEGATIVE
Hgb urine dipstick: NEGATIVE
KETONES UR: NEGATIVE mg/dL
Ketones, ur: NEGATIVE mg/dL
LEUKOCYTES UA: NEGATIVE
NITRITE: NEGATIVE
Nitrite: NEGATIVE
PH: 5.5 (ref 5.0–8.0)
PROTEIN: NEGATIVE mg/dL
Protein, ur: NEGATIVE mg/dL
SPECIFIC GRAVITY, URINE: 1.025 (ref 1.005–1.030)
SPECIFIC GRAVITY, URINE: 1.026 (ref 1.005–1.030)
pH: 5 (ref 5.0–8.0)

## 2015-09-09 LAB — URINE MICROSCOPIC-ADD ON: RBC / HPF: NONE SEEN RBC/hpf (ref 0–5)

## 2015-09-09 LAB — WET PREP, GENITAL
Sperm: NONE SEEN
TRICH WET PREP: NONE SEEN
YEAST WET PREP: NONE SEEN

## 2015-09-09 LAB — POC URINE PREG, ED: Preg Test, Ur: NEGATIVE

## 2015-09-09 MED ORDER — METRONIDAZOLE 500 MG PO TABS
500.0000 mg | ORAL_TABLET | Freq: Once | ORAL | Status: AC
  Administered 2015-09-09: 500 mg via ORAL
  Filled 2015-09-09: qty 1

## 2015-09-09 MED ORDER — STERILE WATER FOR INJECTION IJ SOLN
INTRAMUSCULAR | Status: AC
  Filled 2015-09-09: qty 10

## 2015-09-09 MED ORDER — ONDANSETRON HCL 4 MG PO TABS
8.0000 mg | ORAL_TABLET | Freq: Once | ORAL | Status: AC
  Administered 2015-09-09: 8 mg via ORAL
  Filled 2015-09-09: qty 2

## 2015-09-09 MED ORDER — AZITHROMYCIN 250 MG PO TABS
1000.0000 mg | ORAL_TABLET | Freq: Once | ORAL | Status: AC
  Administered 2015-09-09: 1000 mg via ORAL
  Filled 2015-09-09: qty 4

## 2015-09-09 MED ORDER — METRONIDAZOLE 500 MG PO TABS: 500.0000 mg | ORAL_TABLET | Freq: Three times a day (TID) | ORAL | Status: AC

## 2015-09-09 MED ORDER — CEFAZOLIN SODIUM 500 MG IJ SOLR
250.0000 mg | Freq: Once | INTRAMUSCULAR | Status: AC
  Administered 2015-09-09: 250 mg via INTRAMUSCULAR
  Filled 2015-09-09: qty 500

## 2015-09-09 NOTE — ED Notes (Signed)
Presents with right sided abdominal pain began 2 weeks ago and is intermittent worse with movement pain described as pulling and twisting, associated with nausea and diarrhea. Endorses urinary frequency and pain with urination and milky white vaginal discharge.

## 2015-09-09 NOTE — ED Provider Notes (Signed)
CSN: [id number]     Arrival date & time 09/09/15  1954 History   First MD Initiated Contact with Patient 09/09/15 2127     Chief Complaint  Patient presents with  . Abdominal Pain     (Consider location/radiation/quality/duration/timing/severity/associated sxs/prior Treatment) Patient is a 25 y.o. female presenting with abdominal pain. The history is provided by the patient.  Abdominal Pain Pain location:  Suprapubic Pain quality: sharp   Pain quality comment:  "twisting" Pain radiates to:  Does not radiate Pain severity:  Moderate Onset quality:  Gradual Duration:  2 weeks Timing:  Intermittent Progression:  Waxing and waning Chronicity:  New Context: not trauma   Relieved by:  Nothing Worsened by:  Nothing tried Ineffective treatments:  None tried Associated symptoms: dysuria and vaginal discharge (white)   Associated symptoms: no chest pain, no chills, no constipation, no cough, no fatigue, no fever, no hematuria, no melena, no nausea, no shortness of breath, no sore throat, no vaginal bleeding and no vomiting   Associated symptoms comment:  Urinary frequency Risk factors comment:  Unprotected sex   Past Medical History  Diagnosis Date  . UTI (urinary tract infection)   . Ectopic pregnancy   . Asthma     "haven't used an inhaler in years"  . ASCUS with positive high risk HPV cervical pap smear 02/21/2015    Repeat cotesting in one year as per ASCCP guidelines given age 24<25   Past Surgical History  Procedure Laterality Date  . Laparoscopy  09/13/2011    Procedure: LAPAROSCOPY OPERATIVE;  Surgeon: Tereso NewcomerUgonna A Anyanwu, MD;  Location: [facility];  Service: Gynecology;  Laterality: Left;  operative laparoscopy left salpingectomy with removal of ectopic pregnancy.  . Ectopic pregnancy surgery    . Laparoscopic unilateral salpingectomy      LEFT SIDE with ectopic pregnancy   Family History  Problem Relation Age of Onset  . Anesthesia problems Neg Hx   . Hypotension Neg Hx   .  Malignant hyperthermia Neg Hx   . Pseudochol deficiency Neg Hx   . Cancer Maternal Grandmother    Social History  Substance Use Topics  . Smoking status: Former Smoker -- 1.00 packs/day    Quit date: 04/08/2012  . Smokeless tobacco: Never Used  . Alcohol Use: No   OB History    Gravida Para Term Preterm AB TAB SAB Ectopic Multiple Living   6 1 1  0 5 0 4 1 0 1     Review of Systems  Constitutional: Negative for fever, chills, diaphoresis, activity change, appetite change and fatigue.  HENT: Negative for facial swelling, rhinorrhea, sore throat, trouble swallowing and voice change.   Eyes: Negative for photophobia, pain and visual disturbance.  Respiratory: Negative for cough, shortness of breath, wheezing and stridor.   Cardiovascular: Negative for chest pain, palpitations and leg swelling.  Gastrointestinal: Positive for abdominal pain. Negative for nausea, vomiting, constipation, melena and anal bleeding.  Endocrine: Negative.   Genitourinary: Positive for dysuria, frequency and vaginal discharge (white). Negative for hematuria, vaginal bleeding and vaginal pain.  Musculoskeletal: Negative for myalgias, back pain and arthralgias.  Skin: Negative.  Negative for rash.  Allergic/Immunologic: Negative.   Neurological: Negative for dizziness, tremors, syncope, weakness and headaches.  Psychiatric/Behavioral: Negative for suicidal ideas, sleep disturbance and self-injury.  All other systems reviewed and are negative.     Allergies  Contrast media; Other; and Latex  Home Medications   Prior to Admission medications   Medication Sig Start Date End Date  Taking? Authorizing Provider  ibuprofen (ADVIL,MOTRIN) 200 MG tablet Take 400-800 mg by mouth 2 (two) times daily as needed (for tooth pain).   Yes Historical Provider, MD  metroNIDAZOLE (FLAGYL) 500 MG tablet Take 1 tablet (500 mg total) by mouth 3 (three) times daily. 09/10/15 09/16/15  Lula Olszewski, MD   BP 113/68 mmHg  Pulse 83   Temp(Src) 98.3 F (36.8 C) (Oral)  Resp 20  SpO2 99%  LMP 08/21/2015 (Exact Date) Physical Exam  Constitutional: She is oriented to person, place, and time. She appears well-developed and well-nourished. No distress.  HENT:  Head: Normocephalic and atraumatic.  Right Ear: External ear normal.  Left Ear: External ear normal.  Mouth/Throat: Oropharynx is clear and moist. No oropharyngeal exudate.  Eyes: Conjunctivae and EOM are normal. Pupils are equal, round, and reactive to light. No scleral icterus.  Neck: Normal range of motion. Neck supple. No JVD present. No tracheal deviation present. No thyromegaly present.  Cardiovascular: Normal rate, regular rhythm and intact distal pulses.  Exam reveals no gallop and no friction rub.   No murmur heard. Pulmonary/Chest: Effort normal and breath sounds normal. No respiratory distress. She has no wheezes. She has no rales.  Abdominal: Soft. Bowel sounds are normal. She exhibits no distension. There is tenderness (mild periumblical and suprapubic tenderness. No focal RLQ tenderenss).  Genitourinary: Pelvic exam was performed with patient supine. There is no rash on the right labia. There is no rash on the left labia. Cervix exhibits no motion tenderness, no discharge and no friability. Right adnexum displays no mass, no tenderness and no fullness. Left adnexum displays no mass, no tenderness and no fullness. No erythema in the vagina. No foreign body around the vagina. Vaginal discharge (thick white discharge) found.  Musculoskeletal: Normal range of motion. She exhibits no edema or tenderness.  Neurological: She is alert and oriented to person, place, and time. No cranial nerve deficit. She exhibits normal muscle tone. Coordination normal.  Skin: Skin is warm and dry. She is not diaphoretic. No pallor.  Psychiatric: She has a normal mood and affect. She expresses no homicidal and no suicidal ideation. She expresses no suicidal plans and no homicidal  plans.  Nursing note and vitals reviewed.   ED Course  Procedures (including critical care time) Labs Review Labs Reviewed  WET PREP, GENITAL - Abnormal; Notable for the following:    Clue Cells Wet Prep HPF POC PRESENT (*)    WBC, Wet Prep HPF POC MANY (*)    All other components within normal limits  COMPREHENSIVE METABOLIC PANEL - Abnormal; Notable for the following:    BUN 5 (*)    All other components within normal limits  CBC - Abnormal; Notable for the following:    WBC 11.5 (*)    All other components within normal limits  URINALYSIS, ROUTINE W REFLEX MICROSCOPIC (NOT AT [facility]) - Abnormal; Notable for the following:    Color, Urine AMBER (*)    APPearance TURBID (*)    Bilirubin Urine SMALL (*)    Leukocytes, UA MODERATE (*)    All other components within normal limits  URINE MICROSCOPIC-ADD ON - Abnormal; Notable for the following:    Squamous Epithelial / LPF TOO NUMEROUS TO COUNT (*)    Bacteria, UA MANY (*)    All other components within normal limits  URINALYSIS, ROUTINE W REFLEX MICROSCOPIC (NOT AT [facility]) - Abnormal; Notable for the following:    Color, Urine AMBER (*)    Bilirubin Urine  SMALL (*)    All other components within normal limits  POC URINE PREG, ED  GC/CHLAMYDIA PROBE AMP ([place]) NOT AT [facility]    Imaging Review No results found. I have personally reviewed and evaluated these images and lab results as part of my medical decision-making.   EKG Interpretation None      MDM   Final diagnoses:  Bacterial vaginosis    The patient is a 25 year old female who presents for 2 weeks of suprapubic abdominal pain in 4 days of vaginal discharge. Patient denies any fevers or signs of systemic infection. On arrival patient is afebrile and clinically stable. Patient is nontoxic appearing. Physical exam benign as above except for mild suprapubic tenderness and white vaginal discharge noted on pelvic exam. No significant cervical motion tenderness  appreciated on exam. Original urinalysis contaminated with epithelial cells so repeat urinalysis with in and out catheter is performed and shows no evidence of infection. Given clue cells and white blood cells on wet prep as well as patient's vaginal discharge will treat with 1 week of Flagyl for bacterial vaginosis. We'll empirically treat for cervicitis with ceftriaxone and azithromycin given patient's recent unprotected sex however do not clinically suspect PID based on vitals and physical exam and do not feel 2 weeks of doxycycline as indicated. I considered alternative diagnoses such as appendicitis or abdominal abscess however deem these less likely given the time course of symptoms, the patient's vitals exam, and laboratory evaluation. Patient is discharged with primary care follow-up and standard ED return precautions. Patient expresses understanding and agreement with this plan.  I estimate there is LOW risk for ACUTE APPENDICITIS, BOWEL OBSTRUCTION, CHOLECYSTITIS, DIVERTICULITIS, INCARCERATED HERNIA, MESENTERIC ISCHEMIA, PANCREATITIS, or PERFORATED BOWEL or ULCER, thus I consider the discharge disposition reasonable. Also, there is no evidence or peritonitis, sepsis or toxicity. We have discussed the diagnosis and risks and agree with discharging home to follow-up with their primary doctor. We also discussed returning to the Emergency Department immediately if new or worsening symptoms occur and have discussed the symptoms which are most concerning (e.g., bloody stool, fever, changing or worsening pain, vomiting) that necessitate immediate return.  Patient seen with attending, Dr. Denton Lank, who oversaw clinical decision making.     Lula Olszewski, MD 09/09/15 1610  Cathren Laine, MD 09/10/15 [phone]

## 2015-09-09 NOTE — Discharge Instructions (Signed)

## 2015-09-10 LAB — GC/CHLAMYDIA PROBE AMP (~~LOC~~) NOT AT ARMC
Chlamydia: POSITIVE — AB
NEISSERIA GONORRHEA: NEGATIVE

## 2015-09-11 ENCOUNTER — Telehealth (HOSPITAL_BASED_OUTPATIENT_CLINIC_OR_DEPARTMENT_OTHER): Payer: Self-pay

## 2015-09-19 ENCOUNTER — Emergency Department (HOSPITAL_COMMUNITY)
Admission: EM | Admit: 2015-09-19 | Discharge: 2015-09-19 | Disposition: A | Discharge status: Home / Self Care | Payer: Self-pay | Attending: Emergency Medicine | Admitting: Emergency Medicine

## 2015-09-19 ENCOUNTER — Encounter (HOSPITAL_COMMUNITY): Payer: Self-pay | Admitting: *Deleted

## 2015-09-19 DIAGNOSIS — Z91040 Latex allergy status: Secondary | ICD-10-CM | POA: Insufficient documentation

## 2015-09-19 DIAGNOSIS — J45909 Unspecified asthma, uncomplicated: Secondary | ICD-10-CM | POA: Insufficient documentation

## 2015-09-19 DIAGNOSIS — N946 Dysmenorrhea, unspecified: Secondary | ICD-10-CM | POA: Insufficient documentation

## 2015-09-19 DIAGNOSIS — E669 Obesity, unspecified: Secondary | ICD-10-CM | POA: Insufficient documentation

## 2015-09-19 DIAGNOSIS — Z87440 Personal history of urinary (tract) infections: Secondary | ICD-10-CM | POA: Insufficient documentation

## 2015-09-19 DIAGNOSIS — Z87891 Personal history of nicotine dependence: Secondary | ICD-10-CM | POA: Insufficient documentation

## 2015-09-19 HISTORY — DX: Obesity, unspecified: E66.9

## 2015-09-19 LAB — URINALYSIS, ROUTINE W REFLEX MICROSCOPIC
BILIRUBIN URINE: NEGATIVE
GLUCOSE, UA: NEGATIVE mg/dL
Ketones, ur: 15 mg/dL — AB
Leukocytes, UA: NEGATIVE
Nitrite: NEGATIVE
Protein, ur: NEGATIVE mg/dL
SPECIFIC GRAVITY, URINE: 1.025 (ref 1.005–1.030)
pH: 6 (ref 5.0–8.0)

## 2015-09-19 LAB — COMPREHENSIVE METABOLIC PANEL
ALBUMIN: 3.7 g/dL (ref 3.5–5.0)
ALK PHOS: 78 U/L (ref 38–126)
ALT: 14 U/L (ref 14–54)
AST: 19 U/L (ref 15–41)
Anion gap: 9 (ref 5–15)
BUN: 5 mg/dL — ABNORMAL LOW (ref 6–20)
CALCIUM: 9.2 mg/dL (ref 8.9–10.3)
CO2: 23 mmol/L (ref 22–32)
CREATININE: 0.93 mg/dL (ref 0.44–1.00)
Chloride: 108 mmol/L (ref 101–111)
GFR calc Af Amer: >60 mL/min (ref >60)
GFR calc non Af Amer: >60 mL/min (ref >60)
GLUCOSE: 117 mg/dL — AB (ref 65–99)
Potassium: 3.9 mmol/L (ref 3.5–5.1)
SODIUM: 140 mmol/L (ref 135–145)
Total Bilirubin: 0.5 mg/dL (ref 0.3–1.2)
Total Protein: 6.7 g/dL (ref 6.5–8.1)

## 2015-09-19 LAB — URINE MICROSCOPIC-ADD ON: WBC, UA: NONE SEEN WBC/hpf (ref 0–5)

## 2015-09-19 LAB — CBC
HCT: 42.1 % (ref 36.0–46.0)
HEMOGLOBIN: 14.1 g/dL (ref 12.0–15.0)
MCH: 31.8 pg (ref 26.0–34.0)
MCHC: 33.5 g/dL (ref 30.0–36.0)
MCV: 95 fL (ref 78.0–100.0)
Platelets: 262 10*3/uL (ref 150–400)
RBC: 4.43 MIL/uL (ref 3.87–5.11)
RDW: 13.4 % (ref 11.5–15.5)
WBC: 8.6 10*3/uL (ref 4.0–10.5)

## 2015-09-19 LAB — WET PREP, GENITAL
Sperm: NONE SEEN
TRICH WET PREP: NONE SEEN
YEAST WET PREP: NONE SEEN

## 2015-09-19 LAB — LIPASE, BLOOD: Lipase: 26 U/L (ref 11–51)

## 2015-09-19 NOTE — ED Notes (Signed)
MD at bedside. 

## 2015-09-19 NOTE — ED Notes (Signed)
Pt reports being seen on 4/2 for right side abd pain. Pt was treated for std and bacterial infection. Pt still has pain and was told to come back if the pain continued.

## 2015-09-19 NOTE — ED Provider Notes (Signed)
CSN: [id number]     Arrival date & time 09/19/15  1651 History   First MD Initiated Contact with Patient 09/19/15 2228     Chief Complaint  Patient presents with  . Abdominal Pain     (Consider location/radiation/quality/duration/timing/severity/associated sxs/prior Treatment) HPI 25 y.o. female with a history of prior ectopic pregnancy with laparoscopic unilateral salpingectomy in 2013 and recent ED evaluation for outpatient discharge and intermittent lower abdominal pain on 09/09/2015, status post antibiotic treatment, returns to the emergency department noting intermittent sharp far right lower quadrant pain that seems to wax and wane unpredictably. Total duration of symptoms is now reaching approximately 3 weeks. She states that her symptoms come on sharp and stabbing and lasts for about 1-3 minutes and then spontaneously resolve. No known provocative factor. She denies any recent travel, recent sick contacts, suspicious food intake, nor trauma. LMP was about one month ago, and she expects it to be starting anytime soon. She denies any fever, chills or nausea, vomiting, diarrhea. She denies any dysuria or urinary urgency or frequency. She's been taking ibuprofen intermittently to some affect on her symptoms. She returned to the emergency department today because her symptoms have been intermittently persistent.   Past Medical History  Diagnosis Date  . UTI (urinary tract infection)   . Ectopic pregnancy   . Asthma     "haven't used an inhaler in years"  . ASCUS with positive high risk HPV cervical pap smear 02/21/2015    Repeat cotesting in one year as per ASCCP guidelines given age 50<25  . Obesity    Past Surgical History  Procedure Laterality Date  . Laparoscopy  09/13/2011    Procedure: LAPAROSCOPY OPERATIVE;  Surgeon: Tereso NewcomerUgonna A Anyanwu, MD;  Location: [facility];  Service: Gynecology;  Laterality: Left;  operative laparoscopy left salpingectomy with removal of ectopic pregnancy.  . Ectopic  pregnancy surgery    . Laparoscopic unilateral salpingectomy      LEFT SIDE with ectopic pregnancy   Family History  Problem Relation Age of Onset  . Anesthesia problems Neg Hx   . Hypotension Neg Hx   . Malignant hyperthermia Neg Hx   . Pseudochol deficiency Neg Hx   . Cancer Maternal Grandmother    Social History  Substance Use Topics  . Smoking status: Former Smoker -- 1.00 packs/day    Quit date: 04/08/2012  . Smokeless tobacco: Never Used  . Alcohol Use: No   OB History    Gravida Para Term Preterm AB TAB SAB Ectopic Multiple Living   6 1 1  0 5 0 4 1 0 1     Review of Systems  Constitutional: Negative for fever, chills, activity change and appetite change.  HENT: Negative for congestion, sinus pressure and sneezing.   Respiratory: Negative for cough, chest tightness and shortness of breath.   Cardiovascular: Negative for chest pain.  Gastrointestinal: Positive for abdominal pain. Negative for nausea, vomiting, diarrhea, constipation and blood in stool.  Genitourinary: Negative for dysuria, urgency, frequency, hematuria, flank pain, vaginal bleeding, vaginal discharge, difficulty urinating and vaginal pain.  Musculoskeletal: Negative for myalgias, back pain and neck pain.  Skin: Negative for rash.  Neurological: Negative for dizziness, seizures, syncope and headaches.  All other systems reviewed and are negative.     Allergies  Contrast media; Other; and Latex  Home Medications   Prior to Admission medications   Not on File   BP 108/65 mmHg  Pulse 65  Temp(Src) 98.6 F (37 C) (Oral)  Resp  17  SpO2 99%  LMP 08/21/2015 (Exact Date) Physical Exam  Constitutional: She is oriented to person, place, and time. She appears well-developed and well-nourished. No distress.  HENT:  Head: Normocephalic and atraumatic.  Right Ear: External ear normal.  Left Ear: External ear normal.  Nose: Nose normal.  Mouth/Throat: Oropharynx is clear and moist.  Eyes:  Conjunctivae and EOM are normal. Pupils are equal, round, and reactive to light.  Neck: Normal range of motion.  Cardiovascular: Normal rate, regular rhythm, normal heart sounds and intact distal pulses.   Pulmonary/Chest: Effort normal and breath sounds normal.  Abdominal: Soft. She exhibits no distension. There is no tenderness. There is no guarding. Hernia confirmed negative in the right inguinal area and confirmed negative in the left inguinal area.  Genitourinary: Cervix exhibits no motion tenderness, no discharge and no friability. Right adnexum displays no tenderness. Left adnexum displays no tenderness. No vaginal discharge found.  Small amount of vaginal bleeding of dark blood noted in the cervical os and in the vaginal vault. No evidence of cervicitis.   Musculoskeletal: She exhibits no edema or tenderness.  Neurological: She is alert and oriented to person, place, and time. No cranial nerve deficit. Coordination normal.  Skin: Skin is warm and dry. She is not diaphoretic. No pallor.  Nursing note and vitals reviewed.   ED Course  Procedures (including critical care time) Labs Review Labs Reviewed  WET PREP, GENITAL - Abnormal; Notable for the following:    Clue Cells Wet Prep HPF POC PRESENT (*)    WBC, Wet Prep HPF POC MANY (*)    All other components within normal limits  COMPREHENSIVE METABOLIC PANEL - Abnormal; Notable for the following:    Glucose, Bld 117 (*)    BUN 5 (*)    All other components within normal limits  URINALYSIS, ROUTINE W REFLEX MICROSCOPIC (NOT AT [facility]) - Abnormal; Notable for the following:    Color, Urine AMBER (*)    APPearance CLOUDY (*)    Hgb urine dipstick LARGE (*)    Ketones, ur 15 (*)    All other components within normal limits  URINE MICROSCOPIC-ADD ON - Abnormal; Notable for the following:    Squamous Epithelial / LPF 0-5 (*)    Bacteria, UA RARE (*)    All other components within normal limits  LIPASE, BLOOD  CBC  I-STAT BETA HCG  BLOOD, ED ([facility], [facility], [facility] ONLY)  GC/CHLAMYDIA PROBE AMP ([place]) NOT AT [facility]    Imaging Review No results found. I have personally reviewed and evaluated these images and lab results as part of my medical decision-making.   EKG Interpretation None      MDM  25 y.o. female who was recently seen on 4/2 for evaluation of a 2 week history of intermittent sharp far right lower quadrant abdominal pain, who was diagnosed with chlamydia at that time, treated with ceftriaxone, azithromycin, and a course of Flagyl which she states that she was compliant with returns to the emergency department noting intermittent brief sharp lower abdominal pains. She denies any further vaginal discharge. LMP was about 1 month prior. She denies any current bleeding. Physical exam very reassuring, as above, with no tenderness to palpation noted. Pelvic exam was done and found evidence of the start of her menstrual cycle. No significant discharge, CMT or adnexal tenderness was noted. Feel that her symptoms are likely associated with menses. Given the duration of her symptoms and very intermittent nature of them with significantly  reassuring exam, very low suspicion for acute intra-abdominal pathology such as appendicitis, TOA, diverticulitis, or treatment refractory PID. Reassurance was given and she was recommended to follow-up with her OB/GYN regarding her symptoms, and was advised to take NSAIDs for pain. She stated was understanding and agreeable with this plan.    Final diagnoses:  Menses painful       Francoise Ceo, DO 09/20/15 0110  Laurence Spates, MD 09/21/15 [phone]

## 2015-09-20 LAB — GC/CHLAMYDIA PROBE AMP (~~LOC~~) NOT AT ARMC
Chlamydia: NEGATIVE
Neisseria Gonorrhea: NEGATIVE

## 2015-09-30 ENCOUNTER — Emergency Department (HOSPITAL_COMMUNITY)
Admission: EM | Admit: 2015-09-30 | Discharge: 2015-09-30 | Disposition: A | Discharge status: Home / Self Care | Payer: Self-pay | Attending: Emergency Medicine | Admitting: Emergency Medicine

## 2015-09-30 ENCOUNTER — Encounter (HOSPITAL_COMMUNITY): Payer: Self-pay | Admitting: Family Medicine

## 2015-09-30 ENCOUNTER — Emergency Department (HOSPITAL_COMMUNITY): Payer: Self-pay

## 2015-09-30 DIAGNOSIS — Z91040 Latex allergy status: Secondary | ICD-10-CM | POA: Insufficient documentation

## 2015-09-30 DIAGNOSIS — Y92410 Unspecified street and highway as the place of occurrence of the external cause: Secondary | ICD-10-CM | POA: Insufficient documentation

## 2015-09-30 DIAGNOSIS — Y9389 Activity, other specified: Secondary | ICD-10-CM | POA: Insufficient documentation

## 2015-09-30 DIAGNOSIS — J45909 Unspecified asthma, uncomplicated: Secondary | ICD-10-CM | POA: Insufficient documentation

## 2015-09-30 DIAGNOSIS — E669 Obesity, unspecified: Secondary | ICD-10-CM | POA: Insufficient documentation

## 2015-09-30 DIAGNOSIS — T7491XA Unspecified adult maltreatment, confirmed, initial encounter: Secondary | ICD-10-CM

## 2015-09-30 DIAGNOSIS — Y998 Other external cause status: Secondary | ICD-10-CM | POA: Insufficient documentation

## 2015-09-30 DIAGNOSIS — Z87440 Personal history of urinary (tract) infections: Secondary | ICD-10-CM | POA: Insufficient documentation

## 2015-09-30 DIAGNOSIS — S20211A Contusion of right front wall of thorax, initial encounter: Secondary | ICD-10-CM | POA: Insufficient documentation

## 2015-09-30 DIAGNOSIS — Z87891 Personal history of nicotine dependence: Secondary | ICD-10-CM | POA: Insufficient documentation

## 2015-09-30 MED ORDER — NAPROXEN 500 MG PO TABS: 500.0000 mg | ORAL_TABLET | Freq: Two times a day (BID) | ORAL | Status: DC
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
## 2016-03-17 ENCOUNTER — Emergency Department (HOSPITAL_COMMUNITY): Payer: Self-pay

## 2016-03-17 ENCOUNTER — Emergency Department (HOSPITAL_COMMUNITY)
Admission: EM | Admit: 2016-03-17 | Discharge: 2016-03-17 | Disposition: A | Discharge status: Home / Self Care | Payer: Self-pay | Attending: Emergency Medicine | Admitting: Emergency Medicine

## 2016-03-17 ENCOUNTER — Encounter (HOSPITAL_COMMUNITY): Payer: Self-pay

## 2016-03-17 DIAGNOSIS — R1030 Lower abdominal pain, unspecified: Secondary | ICD-10-CM | POA: Insufficient documentation

## 2016-03-17 DIAGNOSIS — J45909 Unspecified asthma, uncomplicated: Secondary | ICD-10-CM | POA: Insufficient documentation

## 2016-03-17 DIAGNOSIS — Z87891 Personal history of nicotine dependence: Secondary | ICD-10-CM | POA: Insufficient documentation

## 2016-03-17 DIAGNOSIS — Z8759 Personal history of other complications of pregnancy, childbirth and the puerperium: Secondary | ICD-10-CM

## 2016-03-17 DIAGNOSIS — R102 Pelvic and perineal pain: Secondary | ICD-10-CM | POA: Insufficient documentation

## 2016-03-17 DIAGNOSIS — Z3490 Encounter for supervision of normal pregnancy, unspecified, unspecified trimester: Secondary | ICD-10-CM

## 2016-03-17 DIAGNOSIS — Z91040 Latex allergy status: Secondary | ICD-10-CM | POA: Insufficient documentation

## 2016-03-17 DIAGNOSIS — Z331 Pregnant state, incidental: Secondary | ICD-10-CM | POA: Insufficient documentation

## 2016-03-17 DIAGNOSIS — O26899 Other specified pregnancy related conditions, unspecified trimester: Secondary | ICD-10-CM

## 2016-03-17 LAB — URINALYSIS, ROUTINE W REFLEX MICROSCOPIC
BILIRUBIN URINE: NEGATIVE
GLUCOSE, UA: NEGATIVE mg/dL
KETONES UR: NEGATIVE mg/dL
Leukocytes, UA: NEGATIVE
NITRITE: NEGATIVE
PH: 7 (ref 5.0–8.0)
Protein, ur: NEGATIVE mg/dL
SPECIFIC GRAVITY, URINE: 1.016 (ref 1.005–1.030)

## 2016-03-17 LAB — COMPREHENSIVE METABOLIC PANEL WITH GFR
ALT: 25 U/L (ref 14–54)
AST: 18 U/L (ref 15–41)
Albumin: 3.7 g/dL (ref 3.5–5.0)
Alkaline Phosphatase: 77 U/L (ref 38–126)
Anion gap: 7 (ref 5–15)
BUN: 8 mg/dL (ref 6–20)
CO2: 23 mmol/L (ref 22–32)
Calcium: 9.6 mg/dL (ref 8.9–10.3)
Chloride: 107 mmol/L (ref 101–111)
Creatinine, Ser: 0.76 mg/dL (ref 0.44–1.00)
GFR calc Af Amer: >60 mL/min
GFR calc non Af Amer: >60 mL/min
Glucose, Bld: 88 mg/dL (ref 65–99)
Potassium: 4.1 mmol/L (ref 3.5–5.1)
Sodium: 137 mmol/L (ref 135–145)
Total Bilirubin: 0.6 mg/dL (ref 0.3–1.2)
Total Protein: 6.3 g/dL — ABNORMAL LOW (ref 6.5–8.1)

## 2016-03-17 LAB — CBC WITH DIFFERENTIAL/PLATELET
Basophils Absolute: 0 K/uL (ref 0.0–0.1)
Basophils Relative: 0 %
Eosinophils Absolute: 0.3 K/uL (ref 0.0–0.7)
Eosinophils Relative: 3 %
HCT: 43.7 % (ref 36.0–46.0)
Hemoglobin: 14.3 g/dL (ref 12.0–15.0)
Lymphocytes Relative: 31 %
Lymphs Abs: 3.5 K/uL (ref 0.7–4.0)
MCH: 31.5 pg (ref 26.0–34.0)
MCHC: 32.7 g/dL (ref 30.0–36.0)
MCV: 96.3 fL (ref 78.0–100.0)
Monocytes Absolute: 0.8 K/uL (ref 0.1–1.0)
Monocytes Relative: 7 %
Neutro Abs: 6.8 K/uL (ref 1.7–7.7)
Neutrophils Relative %: 59 %
Platelets: 246 K/uL (ref 150–400)
RBC: 4.54 MIL/uL (ref 3.87–5.11)
RDW: 13.1 % (ref 11.5–15.5)
WBC: 11.4 K/uL — ABNORMAL HIGH (ref 4.0–10.5)

## 2016-03-17 LAB — URINE MICROSCOPIC-ADD ON

## 2016-03-17 LAB — POC URINE PREG, ED: Preg Test, Ur: POSITIVE — AB

## 2016-03-17 LAB — HCG, QUANTITATIVE, PREGNANCY: hCG, Beta Chain, Quant, S: 456 m[IU]/mL — ABNORMAL HIGH

## 2016-03-17 MED ORDER — ONDANSETRON 4 MG PO TBDP
4.0000 mg | ORAL_TABLET | Freq: Once | ORAL | Status: AC
  Administered 2016-03-17: 4 mg via ORAL

## 2016-03-17 MED ORDER — ONDANSETRON 4 MG PO TBDP
ORAL_TABLET | ORAL | Status: AC
  Filled 2016-03-17: qty 1

## 2016-03-17 NOTE — ED Notes (Signed)
PA explained tests results and plan of care to pt. 

## 2016-03-17 NOTE — ED Notes (Signed)
Ultrasound tech called to follow up results of pt.'s ultrasound, advised RN that results is not ready at this time .

## 2016-03-17 NOTE — Discharge Instructions (Signed)
Follow-up with [facility] in 48 hours for reevaluation and blood draw. Return to the emergency department sooner if you experience vaginal bleeding, increased pain, vomiting.

## 2016-03-17 NOTE — ED Provider Notes (Signed)
[facility]Y DEPT Provider Note   CSN: [id number] Arrival date & time: 03/17/16  1438     History   Chief Complaint Chief Complaint  Patient presents with  . Abdominal Pain    HPI Cheyenne Walton is a 25 y.o. female with a pmhx of ectopic pregnancy, who presents to the ED today co lower abdominal pressure x 2 days. Pt has associated nausea and vomiting.She denies any true abd pain but states that "it feels like a a lot of pressure". Pt has not tried taking anything for her symptoms. She denies any vaginal bleeding, vaginal discharge, dysuria, fevers, chills. LMP 1 month ago.   HPI  Past Medical History:  Diagnosis Date  . ASCUS with positive high risk HPV cervical pap smear 02/21/2015   Repeat cotesting in one year as per ASCCP guidelines given age <40  . Asthma    "haven't used an inhaler in years"  . Ectopic pregnancy   . Obesity   . UTI (urinary tract infection)     Patient Active Problem List   Diagnosis Date Noted  . 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 ECTOPIC PREGNANCY SURGERY    . LAPAROSCOPIC UNILATERAL SALPINGECTOMY     LEFT SIDE with ectopic pregnancy  . LAPAROSCOPY  09/13/2011   Procedure: LAPAROSCOPY OPERATIVE;  Surgeon: Tereso Newcomer, MD;  Location: [facility];  Service: Gynecology;  Laterality: Left;  operative laparoscopy left salpingectomy with removal of ectopic pregnancy.    OB History    Gravida Para Term Preterm AB Living   6 1 1  0 5 1   SAB TAB Ectopic Multiple Live Births   4 0 1 0 1       Home Medications    Prior to Admission medications   Medication Sig Start Date End Date Taking? Authorizing Provider  naproxen (NAPROSYN) 500 MG tablet Take 1 tablet (500 mg total) by mouth 2 (two) times daily. 09/30/15   Hope Orlene Och, NP    Family History Family History  Problem Relation Age of Onset  . Cancer Maternal Grandmother   . Anesthesia problems Neg Hx     . Hypotension Neg Hx   . Malignant hyperthermia Neg Hx   . Pseudochol deficiency Neg Hx     Social History Social History  Substance Use Topics  . Smoking status: Former Smoker    Packs/day: 1.00    Quit date: 04/08/2012  . Smokeless tobacco: Never Used  . Alcohol use No     Allergies   Contrast media [iodinated diagnostic agents]; Other; and Latex   Review of Systems Review of Systems  All other systems reviewed and are negative.    Physical Exam Updated Vital Signs BP 103/60 (BP Location: Right Arm)   Pulse 70   Temp 98.7 F (37.1 C) (Oral)   Resp 17   Ht 5\' 4"  (1.626 m)   Wt 88.5 kg   SpO2 99%   BMI 33.47 kg/m   Physical Exam  Constitutional: She is oriented to person, place, and time. She appears well-developed and well-nourished. No distress.  HENT:  Head: Normocephalic and atraumatic.  Mouth/Throat: No oropharyngeal exudate.  Eyes: Conjunctivae and EOM are normal. Pupils are equal, round, and reactive to light. Right eye exhibits no discharge. Left eye exhibits no discharge. No scleral icterus.  Cardiovascular: Normal rate, regular rhythm, normal heart sounds and intact distal pulses.  Exam reveals no  gallop and no friction rub.   No murmur heard. Pulmonary/Chest: Effort normal and breath sounds normal. No respiratory distress. She has no wheezes. She has no rales. She exhibits no tenderness.  Abdominal: Soft. Bowel sounds are normal. She exhibits no distension. There is no tenderness. There is no guarding.  Musculoskeletal: Normal range of motion. She exhibits no edema.  Neurological: She is alert and oriented to person, place, and time.  Skin: Skin is warm and dry. No rash noted. She is not diaphoretic. No erythema. No pallor.  Psychiatric: She has a normal mood and affect. Her behavior is normal.  Nursing note and vitals reviewed.    ED Treatments / Results  Labs (all labs ordered are listed, but only abnormal results are displayed) Labs Reviewed   URINALYSIS, ROUTINE W REFLEX MICROSCOPIC (NOT AT [facility]) - Abnormal; Notable for the following:       Result Value   Hgb urine dipstick TRACE (*)    All other components within normal limits  URINE MICROSCOPIC-ADD ON - Abnormal; Notable for the following:    Squamous Epithelial / LPF 0-5 (*)    Bacteria, UA RARE (*)    All other components within normal limits  POC URINE PREG, ED - Abnormal; Notable for the following:    Preg Test, Ur POSITIVE (*)    All other components within normal limits  COMPREHENSIVE METABOLIC PANEL  CBC WITH DIFFERENTIAL/PLATELET    EKG  EKG Interpretation None       Radiology [place] Ob Comp Less 14 Wks  Result Date: 03/17/2016 CLINICAL DATA:  Pelvic pressure for 2 days positive urine pregnancy test EXAM: OBSTETRIC <14 WK US AND TRANSVAGINAL OB US TECHNIQUE: Both transabdominal and transvaginal ultrasound examinations were performed for complete evaluation of the gestation as well as the maternal uterus, adnexal regions, and pelvic cul-de-sac. Transvaginal technique was performed to assess early pregnancy. Quantitative HCG value 456. COMPARISON:  None. FINDINGS: Intrauterine gestational sac: Not clearly identified Yolk sac:  Not visualized Embryo:  Not visualized Cardiac Activity: Not visualized Subchorionic hemorrhage:  None visualized. Maternal uterus/adnexae: Uterus measures 8.5 x 3.8 x 3.8 cm. Endometrial thickness is 8.4 mm. Left ovary measures 4.4 x 2.7 x 3.7 cm with a volume of 22.4 cubic cm. Probable hemorrhagic left ovarian cyst measuring 2.9 x 1.8 x 1.9 cm. Right ovary measures 2.8 x 1.7 x 2.3 cm with a volume of 5.5 cubic cm. There is 1 x 0.9 x 1.1 cm cystic structure adjacent to or possibly arising from the right ovary. There is trace amount of fluid in the cul-de-sac. IMPRESSION: 1. No definite intrauterine gestational sac is visualized. There is a cyst or cystic structure adjacent to or possibly arising from the right ovary. While findings could relate to  very early gestation given the beta HCG, ectopic pregnancy cannot be excluded. Recommend close clinical follow-up with monitoring of beta HCG and repeat ultrasound to document intrauterine pregnancy. 2. Trace amount of free fluid in the cul-de-sac. Electronically Signed   By: Jasmine PangKim  Fujinaga M.D.   On: 03/17/2016 21:51   [place] Ob Transvaginal  Result Date: 03/17/2016 CLINICAL DATA:  Pelvic pressure for 2 days positive urine pregnancy test EXAM: OBSTETRIC <14 WK US AND TRANSVAGINAL OB US TECHNIQUE: Both transabdominal and transvaginal ultrasound examinations were performed for complete evaluation of the gestation as well as the maternal uterus, adnexal regions, and pelvic cul-de-sac. Transvaginal technique was performed to assess early pregnancy. Quantitative HCG value 456. COMPARISON:  None. FINDINGS: Intrauterine gestational sac: Not  clearly identified Yolk sac:  Not visualized Embryo:  Not visualized Cardiac Activity: Not visualized Subchorionic hemorrhage:  None visualized. Maternal uterus/adnexae: Uterus measures 8.5 x 3.8 x 3.8 cm. Endometrial thickness is 8.4 mm. Left ovary measures 4.4 x 2.7 x 3.7 cm with a volume of 22.4 cubic cm. Probable hemorrhagic left ovarian cyst measuring 2.9 x 1.8 x 1.9 cm. Right ovary measures 2.8 x 1.7 x 2.3 cm with a volume of 5.5 cubic cm. There is 1 x 0.9 x 1.1 cm cystic structure adjacent to or possibly arising from the right ovary. There is trace amount of fluid in the cul-de-sac. IMPRESSION: 1. No definite intrauterine gestational sac is visualized. There is a cyst or cystic structure adjacent to or possibly arising from the right ovary. While findings could relate to very early gestation given the beta HCG, ectopic pregnancy cannot be excluded. Recommend close clinical follow-up with monitoring of beta HCG and repeat ultrasound to document intrauterine pregnancy. 2. Trace amount of free fluid in the cul-de-sac. Electronically Signed   By: Jasmine Pang M.D.   On: 03/17/2016  21:51    Procedures Procedures (including critical care time)  Medications Ordered in ED Medications  ondansetron (ZOFRAN-ODT) 4 MG disintegrating tablet (not administered)  ondansetron (ZOFRAN-ODT) disintegrating tablet 4 mg (4 mg Oral Given 03/17/16 1658)     Initial Impression / Assessment and Plan / ED Course  I have reviewed the triage vital signs and the nursing notes.  Pertinent labs & imaging results that were available during my care of the patient were reviewed by me and considered in my medical decision making (see chart for details).  Clinical Course    Otherwise healthy 25 year old female presents to the ED today complaining of lower abdominal pressure 2 days with nausea. Pregnancy test positive today patient does have history of ectopic pregnancy, we will obtain ultrasound to rule out. She denies any true abdominal pain at this time and appears well in the ED. Abdomen is soft and nontender. No vaginal bleeding. HCG Quant is 456. No proteinuria. No elevated BP. All other lab work unremarkable.  Ultrasound reveals no definite IUP. There is a cyst or cystic structure adjacent or possibly arising from right ovary. Cannot rule out ectopic. Spoke with Dr. Nettie Elm with OB/GYN and reviewed results. Given the low beta hCG, cannot diagnose ectopic. Ultrasound right now. He recommends discharge to home with follow-up in 48 hours for repeat Quant. Return to the ED sooner if patient experiences vaginal bleeding or increased belly pain.  Patient was discussed with and seen by Dr. Adela Lank who agrees with the treatment plan.    Final Clinical Impressions(s) / ED Diagnoses   Final diagnoses:  Pregnancy, unspecified gestational age    [place] Prescriptions New Prescriptions   No medications on file     Dub Mikes, PA-C 03/17/16 2213    Melene Plan, DO 03/17/16 2302

## 2016-03-17 NOTE — ED Triage Notes (Signed)
Patient complains of lower abdominal pressure x 2 days. denies abdominal pain. denies nausea, denies dysuria. States she wonders if she is pregnant.

## 2016-03-28 ENCOUNTER — Encounter (HOSPITAL_COMMUNITY)
Admission: AD | Disposition: A | Discharge status: Home / Self Care | Payer: Self-pay | Source: Ambulatory Visit | Attending: Obstetrics & Gynecology

## 2016-03-28 ENCOUNTER — Ambulatory Visit (HOSPITAL_COMMUNITY)
Admission: AD | Admit: 2016-03-28 | Discharge: 2016-03-28 | Disposition: A | Discharge status: Home / Self Care | Payer: Self-pay | Source: Ambulatory Visit | Attending: Obstetrics & Gynecology | Admitting: Obstetrics & Gynecology

## 2016-03-28 ENCOUNTER — Encounter (HOSPITAL_COMMUNITY): Payer: Self-pay | Admitting: *Deleted

## 2016-03-28 ENCOUNTER — Inpatient Hospital Stay (HOSPITAL_COMMUNITY): Payer: Self-pay

## 2016-03-28 ENCOUNTER — Inpatient Hospital Stay (HOSPITAL_COMMUNITY): Payer: Self-pay | Admitting: Certified Registered Nurse Anesthetist

## 2016-03-28 DIAGNOSIS — O00101 Right tubal pregnancy without intrauterine pregnancy: Secondary | ICD-10-CM | POA: Insufficient documentation

## 2016-03-28 DIAGNOSIS — O99212 Obesity complicating pregnancy, second trimester: Secondary | ICD-10-CM | POA: Insufficient documentation

## 2016-03-28 DIAGNOSIS — Z6835 Body mass index (BMI) 35.0-35.9, adult: Secondary | ICD-10-CM | POA: Insufficient documentation

## 2016-03-28 DIAGNOSIS — F17210 Nicotine dependence, cigarettes, uncomplicated: Secondary | ICD-10-CM | POA: Insufficient documentation

## 2016-03-28 DIAGNOSIS — O009 Unspecified ectopic pregnancy without intrauterine pregnancy: Secondary | ICD-10-CM

## 2016-03-28 DIAGNOSIS — O99512 Diseases of the respiratory system complicating pregnancy, second trimester: Secondary | ICD-10-CM | POA: Insufficient documentation

## 2016-03-28 DIAGNOSIS — J45909 Unspecified asthma, uncomplicated: Secondary | ICD-10-CM | POA: Insufficient documentation

## 2016-03-28 DIAGNOSIS — Z91041 Radiographic dye allergy status: Secondary | ICD-10-CM | POA: Insufficient documentation

## 2016-03-28 DIAGNOSIS — O99332 Smoking (tobacco) complicating pregnancy, second trimester: Secondary | ICD-10-CM | POA: Insufficient documentation

## 2016-03-28 DIAGNOSIS — Z91040 Latex allergy status: Secondary | ICD-10-CM | POA: Insufficient documentation

## 2016-03-28 DIAGNOSIS — E669 Obesity, unspecified: Secondary | ICD-10-CM | POA: Insufficient documentation

## 2016-03-28 DIAGNOSIS — O209 Hemorrhage in early pregnancy, unspecified: Secondary | ICD-10-CM

## 2016-03-28 DIAGNOSIS — Z3A01 Less than 8 weeks gestation of pregnancy: Secondary | ICD-10-CM | POA: Insufficient documentation

## 2016-03-28 DIAGNOSIS — Z91013 Allergy to seafood: Secondary | ICD-10-CM | POA: Insufficient documentation

## 2016-03-28 HISTORY — PX: LAPAROSCOPY: SHX197

## 2016-03-28 HISTORY — DX: Unspecified ectopic pregnancy without intrauterine pregnancy: O00.90

## 2016-03-28 HISTORY — DX: Chlamydial infection, unspecified: A74.9

## 2016-03-28 LAB — CBC
HCT: 38.4 % (ref 36.0–46.0)
HEMOGLOBIN: 13.3 g/dL (ref 12.0–15.0)
MCH: 32.1 pg (ref 26.0–34.0)
MCHC: 34.6 g/dL (ref 30.0–36.0)
MCV: 92.8 fL (ref 78.0–100.0)
Platelets: 254 10*3/uL (ref 150–400)
RBC: 4.14 MIL/uL (ref 3.87–5.11)
RDW: 13.1 % (ref 11.5–15.5)
WBC: 10.4 10*3/uL (ref 4.0–10.5)

## 2016-03-28 LAB — GC/CHLAMYDIA PROBE AMP (~~LOC~~) NOT AT ARMC
CHLAMYDIA, DNA PROBE: NEGATIVE
Neisseria Gonorrhea: NEGATIVE

## 2016-03-28 LAB — WET PREP, GENITAL
Sperm: NONE SEEN
Trich, Wet Prep: NONE SEEN
YEAST WET PREP: NONE SEEN

## 2016-03-28 LAB — TYPE AND SCREEN
ABO/RH(D): A NEG
ANTIBODY SCREEN: NEGATIVE

## 2016-03-28 LAB — HCG, QUANTITATIVE, PREGNANCY: hCG, Beta Chain, Quant, S: 4413 m[IU]/mL — ABNORMAL HIGH (ref <5)

## 2016-03-28 SURGERY — LAPAROSCOPY OPERATIVE
Anesthesia: General

## 2016-03-28 MED ORDER — DEXAMETHASONE SODIUM PHOSPHATE 4 MG/ML IJ SOLN
INTRAMUSCULAR | Status: DC | PRN
  Administered 2016-03-28: 4 mg via INTRAVENOUS

## 2016-03-28 MED ORDER — MEPERIDINE HCL 25 MG/ML IJ SOLN: 6.2500 mg | INTRAMUSCULAR | Status: DC | PRN

## 2016-03-28 MED ORDER — FENTANYL CITRATE (PF) 100 MCG/2ML IJ SOLN
INTRAMUSCULAR | Status: DC | PRN
  Administered 2016-03-28 (×3): 50 ug via INTRAVENOUS
  Administered 2016-03-28: 100 ug via INTRAVENOUS

## 2016-03-28 MED ORDER — HYDROMORPHONE HCL 1 MG/ML IJ SOLN
INTRAMUSCULAR | Status: DC | PRN
  Administered 2016-03-28: 1 mg via INTRAVENOUS

## 2016-03-28 MED ORDER — IBUPROFEN 600 MG PO TABS: 600.0000 mg | ORAL_TABLET | Freq: Four times a day (QID) | ORAL | 3 refills | Status: AC | PRN

## 2016-03-28 MED ORDER — MIDAZOLAM HCL 2 MG/2ML IJ SOLN
INTRAMUSCULAR | Status: AC
  Filled 2016-03-28: qty 2

## 2016-03-28 MED ORDER — PROPOFOL 10 MG/ML IV BOLUS
INTRAVENOUS | Status: AC
  Filled 2016-03-28: qty 20

## 2016-03-28 MED ORDER — BUPIVACAINE HCL (PF) 0.5 % IJ SOLN
INTRAMUSCULAR | Status: DC | PRN
  Administered 2016-03-28: 7 mL

## 2016-03-28 MED ORDER — OXYCODONE-ACETAMINOPHEN 5-325 MG PO TABS
ORAL_TABLET | ORAL | Status: AC
  Filled 2016-03-28: qty 1

## 2016-03-28 MED ORDER — SUGAMMADEX SODIUM 200 MG/2ML IV SOLN
INTRAVENOUS | Status: DC | PRN
  Administered 2016-03-28: 185 mg via INTRAVENOUS

## 2016-03-28 MED ORDER — FAMOTIDINE IN NACL 20-0.9 MG/50ML-% IV SOLN
20.0000 mg | Freq: Once | INTRAVENOUS | Status: AC
  Administered 2016-03-28: 20 mg via INTRAVENOUS
  Filled 2016-03-28: qty 50

## 2016-03-28 MED ORDER — RHO D IMMUNE GLOBULIN 1500 UNIT/2ML IJ SOSY
300.0000 ug | PREFILLED_SYRINGE | Freq: Once | INTRAMUSCULAR | Status: AC
  Administered 2016-03-28: 300 ug via INTRAVENOUS
  Filled 2016-03-28: qty 2

## 2016-03-28 MED ORDER — SOD CITRATE-CITRIC ACID 500-334 MG/5ML PO SOLN: 30.0000 mL | Freq: Once | ORAL | Status: DC

## 2016-03-28 MED ORDER — KETOROLAC TROMETHAMINE 30 MG/ML IJ SOLN: 30.0000 mg | Freq: Once | INTRAMUSCULAR | Status: DC

## 2016-03-28 MED ORDER — DEXAMETHASONE SODIUM PHOSPHATE 4 MG/ML IJ SOLN
INTRAMUSCULAR | Status: AC
  Filled 2016-03-28: qty 1

## 2016-03-28 MED ORDER — DOCUSATE SODIUM 100 MG PO CAPS
100.0000 mg | ORAL_CAPSULE | Freq: Two times a day (BID) | ORAL | 2 refills | Status: AC | PRN
Start: 2016-03-28 — End: ?

## 2016-03-28 MED ORDER — MIDAZOLAM HCL 2 MG/2ML IJ SOLN
INTRAMUSCULAR | Status: DC | PRN
  Administered 2016-03-28: 2 mg via INTRAVENOUS

## 2016-03-28 MED ORDER — METRONIDAZOLE 500 MG PO TABS: 500.0000 mg | ORAL_TABLET | Freq: Two times a day (BID) | ORAL | 0 refills | Status: DC

## 2016-03-28 MED ORDER — LACTATED RINGERS IV SOLN
INTRAVENOUS | Status: DC
  Administered 2016-03-28 (×3): via INTRAVENOUS

## 2016-03-28 MED ORDER — OXYCODONE-ACETAMINOPHEN 5-325 MG PO TABS
1.0000 | ORAL_TABLET | ORAL | Status: DC | PRN
  Administered 2016-03-28: 1 via ORAL

## 2016-03-28 MED ORDER — SCOPOLAMINE 1 MG/3DAYS TD PT72
MEDICATED_PATCH | TRANSDERMAL | Status: DC | PRN
  Administered 2016-03-28: 1 via TRANSDERMAL

## 2016-03-28 MED ORDER — KETOROLAC TROMETHAMINE 30 MG/ML IJ SOLN
INTRAMUSCULAR | Status: DC | PRN
  Administered 2016-03-28: 30 mg via INTRAVENOUS

## 2016-03-28 MED ORDER — ROCURONIUM BROMIDE 100 MG/10ML IV SOLN
INTRAVENOUS | Status: AC
  Filled 2016-03-28: qty 1

## 2016-03-28 MED ORDER — OXYCODONE-ACETAMINOPHEN 5-325 MG PO TABS: 1.0000 | ORAL_TABLET | Freq: Four times a day (QID) | ORAL | 0 refills | Status: AC | PRN

## 2016-03-28 MED ORDER — FENTANYL CITRATE (PF) 100 MCG/2ML IJ SOLN: 25.0000 ug | INTRAMUSCULAR | Status: DC | PRN

## 2016-03-28 MED ORDER — SCOPOLAMINE 1 MG/3DAYS TD PT72
MEDICATED_PATCH | TRANSDERMAL | Status: AC
  Filled 2016-03-28: qty 1

## 2016-03-28 MED ORDER — HYDROMORPHONE HCL 1 MG/ML IJ SOLN
INTRAMUSCULAR | Status: AC
  Filled 2016-03-28: qty 1

## 2016-03-28 MED ORDER — SUGAMMADEX SODIUM 200 MG/2ML IV SOLN
INTRAVENOUS | Status: AC
  Filled 2016-03-28: qty 2

## 2016-03-28 MED ORDER — HYDROMORPHONE HCL 1 MG/ML IJ SOLN
1.0000 mg | Freq: Once | INTRAMUSCULAR | Status: AC
  Administered 2016-03-28: 1 mg via INTRAVENOUS
  Filled 2016-03-28: qty 1

## 2016-03-28 MED ORDER — FENTANYL CITRATE (PF) 250 MCG/5ML IJ SOLN
INTRAMUSCULAR | Status: AC
  Filled 2016-03-28: qty 5

## 2016-03-28 MED ORDER — KETOROLAC TROMETHAMINE 30 MG/ML IJ SOLN
INTRAMUSCULAR | Status: AC
  Filled 2016-03-28: qty 1

## 2016-03-28 MED ORDER — LACTATED RINGERS IV SOLN: INTRAVENOUS | Status: DC

## 2016-03-28 MED ORDER — LIDOCAINE HCL (CARDIAC) 20 MG/ML IV SOLN
INTRAVENOUS | Status: AC
  Filled 2016-03-28: qty 5

## 2016-03-28 MED ORDER — ONDANSETRON HCL 4 MG/2ML IJ SOLN
INTRAMUSCULAR | Status: AC
  Filled 2016-03-28: qty 2

## 2016-03-28 MED ORDER — ONDANSETRON HCL 4 MG/2ML IJ SOLN
INTRAMUSCULAR | Status: DC | PRN
  Administered 2016-03-28: 4 mg via INTRAVENOUS

## 2016-03-28 MED ORDER — RHO D IMMUNE GLOBULIN 1500 UNIT/2ML IJ SOSY
300.0000 ug | PREFILLED_SYRINGE | Freq: Once | INTRAMUSCULAR | Status: DC
  Filled 2016-03-28: qty 2

## 2016-03-28 MED ORDER — ONDANSETRON HCL 4 MG/2ML IJ SOLN: 4.0000 mg | Freq: Once | INTRAMUSCULAR | Status: DC | PRN

## 2016-03-28 MED ORDER — PROPOFOL 10 MG/ML IV BOLUS
INTRAVENOUS | Status: DC | PRN
  Administered 2016-03-28: 200 mg via INTRAVENOUS

## 2016-03-28 MED ORDER — LIDOCAINE HCL (CARDIAC) 20 MG/ML IV SOLN
INTRAVENOUS | Status: DC | PRN
  Administered 2016-03-28: 80 mg via INTRAVENOUS

## 2016-03-28 MED ORDER — ROCURONIUM BROMIDE 100 MG/10ML IV SOLN
INTRAVENOUS | Status: DC | PRN
  Administered 2016-03-28: 10 mg via INTRAVENOUS
  Administered 2016-03-28: 40 mg via INTRAVENOUS

## 2016-03-28 SURGICAL SUPPLY — 32 items
APPLICATOR ARISTA FLEXITIP XL (MISCELLANEOUS) ×3 IMPLANT
CABLE HIGH FREQUENCY MONO STRZ (ELECTRODE) IMPLANT
CLOTH BEACON ORANGE TIMEOUT ST (SAFETY) ×3 IMPLANT
DRSG OPSITE 4X5.5 SM (GAUZE/BANDAGES/DRESSINGS) ×3 IMPLANT
DRSG OPSITE POSTOP 3X4 (GAUZE/BANDAGES/DRESSINGS) IMPLANT
DURAPREP 26ML APPLICATOR (WOUND CARE) ×3 IMPLANT
GLOVE BIOGEL PI IND STRL 7.0 (GLOVE) ×3 IMPLANT
GLOVE BIOGEL PI INDICATOR 7.0 (GLOVE) ×6
GLOVE ECLIPSE 7.0 STRL STRAW (GLOVE) ×3 IMPLANT
GOWN STRL REUS W/TWL LRG LVL3 (GOWN DISPOSABLE) ×9 IMPLANT
HEMOSTAT ARISTA ABSORB 3G PWDR (MISCELLANEOUS) ×3 IMPLANT
LIQUID BAND (GAUZE/BANDAGES/DRESSINGS) ×3 IMPLANT
NEEDLE INSUFFLATION 120MM (ENDOMECHANICALS) IMPLANT
NS IRRIG 1000ML POUR BTL (IV SOLUTION) ×3 IMPLANT
PACK LAPAROSCOPY BASIN (CUSTOM PROCEDURE TRAY) ×3 IMPLANT
PACK TRENDGUARD 450 HYBRID PRO (MISCELLANEOUS) IMPLANT
PACK TRENDGUARD 600 HYBRD PROC (MISCELLANEOUS) IMPLANT
POUCH SPECIMEN RETRIEVAL 10MM (ENDOMECHANICALS) IMPLANT
PROTECTOR NERVE ULNAR (MISCELLANEOUS) ×6 IMPLANT
SET IRRIG TUBING LAPAROSCOPIC (IRRIGATION / IRRIGATOR) IMPLANT
SHEARS HARMONIC ACE PLUS 36CM (ENDOMECHANICALS) IMPLANT
SLEEVE XCEL OPT CAN 5 100 (ENDOMECHANICALS) ×3 IMPLANT
SUT VICRYL 0 UR6 27IN ABS (SUTURE) ×6 IMPLANT
SUT VICRYL 4-0 PS2 18IN ABS (SUTURE) ×3 IMPLANT
TOWEL OR 17X24 6PK STRL BLUE (TOWEL DISPOSABLE) ×6 IMPLANT
TRAY FOLEY CATH SILVER 14FR (SET/KITS/TRAYS/PACK) ×3 IMPLANT
TRENDGUARD 450 HYBRID PRO PACK (MISCELLANEOUS)
TRENDGUARD 600 HYBRID PROC PK (MISCELLANEOUS)
TROCAR XCEL NON-BLD 11X100MML (ENDOMECHANICALS) ×3 IMPLANT
TROCAR XCEL NON-BLD 5MMX100MML (ENDOMECHANICALS) ×3 IMPLANT
WARMER LAPAROSCOPE (MISCELLANEOUS) ×3 IMPLANT
WATER STERILE IRR 1000ML POUR (IV SOLUTION) IMPLANT

## 2016-03-28 NOTE — MAU Provider Note (Signed)
MAU HISTORY AND PHYSICAL  Chief Complaint:  Vaginal Bleeding   Cheyenne Walton is a 25 y.o.  [phone]  at 6011w3d presenting for Vaginal Bleeding . Patient states she has been having  none contractions, moderate vaginal bleeding.  Has been feeling pressure that is "hard to describe" since [facility], was supposed to follow up 2 days after for repeat HCG but never went because she started bleeding and thought she was having a miscarriage. Bleeding has been intermittent and heavier than a period at times. Currently with light bleeding. The pressure has been present and constant but has gotten more severe since last night. Feels like the pressure is in RLQ. Endorses pain now from the pressure in the RLQ. Has hx of ectopic with failed MTX treatment, had left fallopian tube removed at that time. [facility] ovarian cyst and no definitive IUP.   Past Medical History:  Diagnosis Date  . ASCUS with positive high risk HPV cervical pap smear 02/21/2015   Repeat cotesting in one year as per ASCCP guidelines given age 80<25  . Asthma    "haven't used an inhaler in years"  . Ectopic pregnancy   . Obesity   . UTI (urinary tract infection)     Past Surgical History:  Procedure Laterality Date  . ECTOPIC PREGNANCY SURGERY    . LAPAROSCOPIC UNILATERAL SALPINGECTOMY     LEFT SIDE with ectopic pregnancy  . LAPAROSCOPY  09/13/2011   Procedure: LAPAROSCOPY OPERATIVE;  Surgeon: Tereso NewcomerUgonna A Anyanwu, MD;  Location: [facility];  Service: Gynecology;  Laterality: Left;  operative laparoscopy left salpingectomy with removal of ectopic pregnancy.    Family History  Problem Relation Age of Onset  . Cancer Maternal Grandmother   . Anesthesia problems Neg Hx   . Hypotension Neg Hx   . Malignant hyperthermia Neg Hx   . Pseudochol deficiency Neg Hx     Social History  Substance Use Topics  . Smoking status: Former Smoker    Packs/day: 1.00    Quit date: 04/08/2012  .  Smokeless tobacco: Never Used  . Alcohol use No    Allergies  Allergen Reactions  . Contrast Media [Iodinated Diagnostic Agents] Anaphylaxis  . Other Anaphylaxis, Rash and Other (See Comments)    Seafood - throat swelling  . Latex Hives    Prescriptions Prior to Admission  Medication Sig Dispense Refill Last Dose  . naproxen (NAPROSYN) 500 MG tablet Take 1 tablet (500 mg total) by mouth 2 (two) times daily. 30 tablet 0 More than a month at Unknown time    Review of Systems - Negative except for what is mentioned in HPI.  Physical Exam  Blood pressure 126/79, pulse 99, temperature 98.2 F (36.8 C), temperature source Oral, resp. rate 20, height 5\' 3"  (1.6 m), weight 91.9 kg (202 lb 8 oz), last menstrual period 02/19/2016. GENERAL: Well-developed, well-nourished female in no acute distress.  LUNGS: No respiratory distress HEART: Regular rate ABDOMEN: Soft, nondistended, tender in RLQ  EXTREMITIES: Nontender, no edema, 2+ distal pulses. GU: trace amount of blood in vaginal vault, cervix closed with minimal blood at os. Tender with cervical motion and palpation of right ovary   Labs: Results for orders placed or performed during the hospital encounter of 03/28/16 (from the past 24 hour(s))  CBC   Collection Time: 03/28/16  5:58 AM  Result Value Ref Range   WBC 10.4 4.0 - 10.5 K/uL   RBC 4.14 3.87 -  5.11 MIL/uL   Hemoglobin 13.3 12.0 - 15.0 g/dL   HCT 16.1 09.6 - 04.5 %   MCV 92.8 78.0 - 100.0 fL   MCH 32.1 26.0 - 34.0 pg   MCHC 34.6 30.0 - 36.0 g/dL   RDW 40.9 81.1 - 91.4 %   Platelets 254 150 - 400 K/uL    Imaging Studies:   Assessment:   Cheyenne Walton is  25 y.o. [phone] at [redacted]w[redacted]d presents with Vaginal Bleeding . Ectopic pregnancy   MDM Repeat US Quant HCG CBC Wet prep Gc/chlamydia  Plan:  First trimester bleeding- live ectopic pregnancy on right Dr. Dolan Amen notified will proceed with surgery  Tillman Sers, DO PGY-1 10/20/20176:18  AM   CNM attestation:  I have seen and examined this patient; I agree with above documentation in the resident's note.   Cheyenne Walton is a 25 y.o. [phone] reporting vaginal bleeding ad pelvic pain.   PE: BP 129/76 (BP Location: Left Arm)   Pulse 86   Temp 98.2 F (36.8 C) (Oral)   Resp 20   Ht 5\' 3"  (1.6 m)   Wt 202 lb 8 oz (91.9 kg)   LMP 02/19/2016   SpO2 100%   BMI 35.87 kg/m  Gen: calm comfortable, NAD Resp: normal effort, no distress Abd: non-tender   ROS, labs, PMH reviewed   Plan: Ectopic pregnancy [facility] notified Will proceed to the OR Has not had anything to eat since midnight.    Tawnya Crook, CNM 6:52 AM

## 2016-03-28 NOTE — Anesthesia Postprocedure Evaluation (Signed)
Anesthesia Post Note  Patient: Cheyenne Walton  Procedure(s) Performed: Procedure(s) (LRB): LAPAROSCOPY OPERATIVE With Right Salpingostomy and Removal of Right Ectopic Pregnancy (N/A)  Patient location during evaluation: PACU Anesthesia Type: General Level of consciousness: awake and sedated Pain management: pain level controlled Vital Signs Assessment: post-procedure vital signs reviewed and stable Respiratory status: spontaneous breathing Cardiovascular status: stable Postop Assessment: no signs of nausea or vomiting Anesthetic complications: no     Last Vitals:  Vitals:   03/28/16 1215 03/28/16 1245  BP: 110/68   Pulse: 67 66  Resp: 12 13  Temp: 36.9 C     Last Pain:  Vitals:   03/28/16 1245  TempSrc:   PainSc: 0-No pain   Pain Goal: Patients Stated Pain Goal: 0 (03/28/16 0924)               Tammye Kahler JR,JOHN Susann GivensFRANKLIN

## 2016-03-28 NOTE — Anesthesia Procedure Notes (Signed)
Procedure Name: Intubation Date/Time: 03/28/2016 10:10 AM Performed by: Yolonda KidaARVER, Latrell Reitan L Pre-anesthesia Checklist: Patient identified, Emergency Drugs available, Suction available and Patient being monitored Patient Re-evaluated:Patient Re-evaluated prior to inductionOxygen Delivery Method: Circle system utilized Preoxygenation: Pre-oxygenation with 100% oxygen Intubation Type: IV induction Ventilation: Mask ventilation without difficulty Laryngoscope Size: Mac and 3 Grade View: Grade II Tube type: Oral Tube size: 7.0 mm Number of attempts: 2 (1 attempt with Hyacinth MeekerMiller 3--Grade 3 view and no success; 1 attempt with MAC3--Grade 2 view and attempt to intubate successful) Airway Equipment and Method: Stylet Placement Confirmation: positive ETCO2,  CO2 detector and breath sounds checked- equal and bilateral Secured at: 22 cm Tube secured with: Tape Dental Injury: Teeth and Oropharynx as per pre-operative assessment

## 2016-03-28 NOTE — Anesthesia Preprocedure Evaluation (Addendum)
Anesthesia Evaluation  Patient identified by MRN, date of birth, ID band Patient awake    Reviewed: Allergy & Precautions, H&P , NPO status , Patient's Chart, lab work & pertinent test results  Airway Mallampati: III  TM Distance: >3 FB Neck ROM: full    Dental no notable dental hx. (+) Teeth Intact   Pulmonary asthma , Current Smoker,    Pulmonary exam normal        Cardiovascular negative cardio ROS Normal cardiovascular exam Rhythm:regular Rate:Normal     Neuro/Psych negative neurological ROS  negative psych ROS   GI/Hepatic negative GI ROS, Neg liver ROS,   Endo/Other  negative endocrine ROS  Renal/GU negative Renal ROS  negative genitourinary   Musculoskeletal   Abdominal (+) + obese,   Peds  Hematology negative hematology ROS (+)   Anesthesia Other Findings   Reproductive/Obstetrics (+) Pregnancy                             Anesthesia Physical  Anesthesia Plan  ASA: II and emergent  Anesthesia Plan: General ETT   Post-op Pain Management:    Induction: Intravenous  Airway Management Planned: Oral ETT  Additional Equipment:   Intra-op Plan:   Post-operative Plan: Extubation in OR  Informed Consent: I have reviewed the patients History and Physical, chart, labs and discussed the procedure including the risks, benefits and alternatives for the proposed anesthesia with the patient or authorized representative who has indicated his/her understanding and acceptance.   Dental Advisory Given  Plan Discussed with: CRNA and Surgeon  Anesthesia Plan Comments:         Anesthesia Quick Evaluation

## 2016-03-28 NOTE — Discharge Instructions (Signed)
Laparoscopic Surgery - Care After Laparoscopy is a surgical procedure. It is used to diagnose and treat diseases inside the belly(abdomen). It is usually a brief, common, and relatively simple procedure. The laparoscopeis a thin, lighted, pencil-sized instrument. It is like a telescope. It is inserted into your abdomen through a small cut (incision). Your caregiver can look at the organs inside your body through this instrument.  She can see if there is anything abnormal. Laparoscopy can be done either in a hospital or outpatient clinic. You may be given a mild sedative to help you relax before the procedure. Once in the operating room, you will be given a drug to make you sleep (general anesthesia). Laparoscopy usually lasts about 1 hour. After the procedure, you will be monitored in a recovery area until you are stable and doing well. Once you are home, it may take 3 to 7 days to fully recover.  Laparoscopy has relatively few risks. Your caregiver will discuss the risks with you before the procedure. Some problems that can occur include: RISKS AND COMPLICATIONS  Allergies to medicines. Difficulty breathing. Bleeding. Infection. Damage to other surrounding structures HOME CARE INSTRUCTIONS  Infection. Bleeding. Damage to other organs. Anesthetic side effects.  Need for additional procedures such as open procedures/laparotomy PROCEDURE Once you receive anesthesia, your surgeon inflates the abdomen with a harmless gas (carbon dioxide). This makes the organs easier to see. The laparoscope is inserted into the abdomen through a small incision. This allows your surgeon to see into the abdomen. Other small instruments are also inserted into the abdomen through other small openings. Many surgeons attach a video camera to the laparoscope to enlarge the view. During a laparoscopy, the surgeon may be looking for inflammation, infection, or cancer.  The surgeon may also need to take out certain organs or  take tissue samples (biopsies). The specimens are sent to a specialist in looking at cells and tissue samples (pathologist). The pathologist examines them under a microscope to help to diagnose or confirm a disease. AFTER THE PROCEDURE  The incisions are closed with stitches (sutures) and Dermabond. Because these incisions are small (usually less than 1/2 inch), there is usually minimal discomfort after the procedure. There may also be discomfort from the instrument placement incisions in the abdomen. You will be given pain medicine to ease any discomfort. You will rest in a recovery room for 1-2 hours until you are stable and doing well. You may have some mild discomfort in the throat. This is from the tube placed in your throat while you were sleeping. You may experience discomfort in the shoulder area from some trapped air between the liver and diaphragm. This sensation is normal and will slowly go away on its own. The recovery time is shortened as long as there are no complications. You will rest in a recovery room until stable and doing well. As long as there are no complications, you may be allowed to go home. Someone will need to drive you home and be with you for at least 24 hours once home. FINDING OUT THE RESULTS You will be called with the results of the pathology and will discuss these results with  your caregiver during your postoperative appointment. Do not assume everything is normal if you have not heard from your caregiver or the medical facility. It is important for you to follow up on all of your results. HOME CARE INSTRUCTIONS  Take all medicines as directed. Only take over-the-counter or prescription medicines for pain, discomfort,   CARE INSTRUCTIONS   Take all medicines as directed.  Only take over-the-counter or prescription medicines for pain, discomfort, or fever as directed by your caregiver.  Resume daily activities as directed.  Showers are preferred over baths.  You may resume sexual activities in 1 week or as directed.  Do not drive while taking  narcotics. SEEK MEDICAL CARE IF:  There is increasing abdominal pain.  You feel lightheaded or faint.  You have the chills.  You have an oral temperature above 102 F (38.9 C).  There is pus-like (purulent) drainage from any of the wounds.  You are unable to pass gas or have a bowel movement.  You feel sick to your stomach (nauseous) or throw up (vomit). MAKE SURE YOU:   Understand these instructions.  Will watch your condition.  Will get help right away if you are not doing well or get worse.  ExitCare Patient Information 2013 [employer] [place]  DISCHARGE INSTRUCTIONS: Laparoscopy  The following instructions have been prepared to help you care for yourself upon your return home today.  Wound care:  Do not get the incision wet for the first 24 hours. The incision should be kept clean and dry.  The Band-Aids or dressings may be removed the day after surgery.  Should the incision become sore, red, and swollen after the first week, check with your doctor.  Personal hygiene:  Shower the day after your procedure.  Activity and limitations:  Do NOT drive or operate any equipment today.  Do NOT lift anything more than 15 pounds for 2-3 weeks after surgery.  Do NOT rest in bed all day.  Walking is encouraged. Walk each day, starting slowly with 5-minute walks 3 or 4 times a day. Slowly increase the length of your walks.  Walk up and down stairs slowly.  Do NOT do strenuous activities, such as golfing, playing tennis, bowling, running, biking, weight lifting, gardening, mowing, or vacuuming for 2-4 weeks. Ask your doctor when it is okay to start.  Diet: Eat a light meal as desired this evening. You may resume your usual diet tomorrow.  Return to work: This is dependent on the type of work you do. For the most part you can return to a desk job within a week of surgery. If you are more active at work, please discuss this with your doctor.  What to expect after your  surgery: You may have a slight burning sensation when you urinate on the first day. You may have a very small amount of blood in the urine. Expect to have a small amount of vaginal discharge/light bleeding for 1-2 weeks. It is not unusual to have abdominal soreness and bruising for up to 2 weeks. You may be tired and need more rest for about 1 week. You may experience shoulder pain for 24-72 hours. Lying flat in bed may relieve it.  Call your doctor for any of the following:  Develop a fever of 100.4 or greater  Inability to urinate 6 hours after discharge from hospital  Severe pain not relieved by pain medications  Persistent of heavy bleeding at incision site  Redness or swelling around incision site after a week  Increasing nausea or vomiting  Post Anesthesia Home Care Instructions  Activity: Get plenty of rest for the remainder of the day. A responsible adult should stay with you for 24 hours following the procedure.  For the next 24 hours, DO NOT: -Drive a car -Advertising copywriterperate machinery -Drink alcoholic beverages -Take any medication  unless instructed by your physician -Make any legal decisions or sign important papers.  Meals: Start with liquid foods such as gelatin or soup. Progress to regular foods as tolerated. Avoid greasy, spicy, heavy foods. If nausea and/or vomiting occur, drink only clear liquids until the nausea and/or vomiting subsides. Call your physician if vomiting continues.  Special Instructions/Symptoms: Your throat may feel dry or sore from the anesthesia or the breathing tube placed in your throat during surgery. If this causes discomfort, gargle with warm salt water. The discomfort should disappear within 24 hours.  If you had a scopolamine patch placed behind your ear for the management of post- operative nausea and/or vomiting:  1. The medication in the patch is effective for 72 hours, after which it should be removed.  Wrap patch in a tissue and discard in the  trash. Wash hands thoroughly with soap and water. 2. You may remove the patch earlier than 72 hours if you experience unpleasant side effects which may include dry mouth, dizziness or visual disturbances. 3. Avoid touching the patch. Wash your hands with soap and water after contact with the patch.

## 2016-03-28 NOTE — MAU Note (Signed)
PT  HAS ARRIVED  VIA EMS-  SAYING ON 10-9- SHE WENT TO [facility]  FEELING PRESSURE -  DID UPT AND LABS AND U/S.     ALL HAS BEEN OK SINCE  THEN EXCEPT  STILL FELT  PRESSURE.     HAS HAD BLEEDING OFF/ON  SINCE .    THEN AT  0500 TODAY  PRESSURE  IS  REALLY STRONG.    WORE TAMPON IN -   AFTER REMOVED  - NO VISUAL  BLEEDING.   CRAMPING .     LAST SEX-   IN SEPT

## 2016-03-28 NOTE — OR Nursing (Addendum)
Informed Dr. Erin FullingHarraway-Smith that patient's blood type is A neg with neg antibody screen.  Per Dr Erin FullingHarraway-Smith, patient does not need Rhogam. Verified verbal order.  Confirmed no Rhogam needed.

## 2016-03-28 NOTE — Op Note (Signed)
[place] PROCEDURE DATE: 03/28/2016  PREOPERATIVE DIAGNOSIS: Viable right fallopian tubal ectopic pregnancy POSTOPERATIVE DIAGNOSIS: The same PROCEDURE: Laparoscopic right salpingostomy and removal of ectopic pregnancy SURGEON:  Dr. Jaynie CollinsUgonna Azahel Belcastro ANESTHESIOLOGIST: No responsible provider has been recorded for the case. Anesthesiologist: Leilani AbleFranklin Hatchett, MD CRNA: Yolonda KidaAlison L Carver, CRNA  INDICATIONS: 25 y.o. [phone] at [id number] here with the preoperative diagnoses as listed above.  Please refer to preoperative notes for more details. Patient was counseled regarding need for laparoscopic removal of ectopic pregnancy.  She has a history of previous ectopic pregnancy s/p left salpingectomy in 2013, and desires tube-sparing surgery. Risks of surgery including bleeding which may require transfusion or reoperation, infection, injury to bowel or other surrounding organs, need for additional procedures including laparotomy and other postoperative/anesthesia complications were explained to patient.  Written informed consent was obtained.  FINDINGS:  Moderate amount of hemoperitoneum estimated to be about 200 ml of blood and clots.  Dilated right fallopian tube containing ectopic gestation. Small normal appearing uterus, right ovary and left ovary. Surgically absent left fallopian tube  ANESTHESIA: General INTRAVENOUS FLUIDS: 1300 ml ESTIMATED BLOOD LOSS: 20 ml URINE OUTPUT: 150 ml SPECIMENS: Ectopic gestation tissue COMPLICATIONS: None immediate  PROCEDURE IN DETAIL:  The patient was taken to the operating room where general anesthesia was administered and was found to be adequate.  She was placed in the dorsal lithotomy position, and was prepped and draped in a sterile manner.  A Foley catheter was inserted into her bladder and attached to constant drainage and a uterine manipulator was then advanced into the uterus .    After an adequate timeout was performed, attention was turned to the abdomen  where an umbilical incision was made with the scalpel.  The Optiview 11-mm trocar and sleeve were then advanced without difficulty with the laparoscope under direct visualization into the abdomen.  The abdomen was then insufflated with carbon dioxide gas and adequate pneumoperitoneum was obtained.  A survey of the patient's pelvis and abdomen revealed the findings above.  Two 5-mm left lower quadrant ports were then placed under direct visualization.  The Nezhat suction irrigator was then used to suction the hemoperitoneum and irrigate the pelvis.  Attention was then turned to the right fallopian tube which was grasped, and a linear incision was made over the ectopic pregnancy using the Nezhat needle apparatus.  The tissue was grasped and the tube was irrigated to remove residual tissue. The tissue was removed from the abdomen.  The incision edges were cauterized using the Bipolar cautery device and Arista was placed over the area to help with hemostasis.  The abdomen was desufflated, and all instruments were removed.  The fascial incision of the 10-mm site was reapproximated with a 0 Vicryl figure-of-eight stitch; and all skin incisions were closed with 3-0 Vicryl and Dermabond. The patient tolerated the procedure well.  All instruments, needles, and sponge counts were correct x 2. The patient was taken to the recovery room in stable condition.   The patient will be discharged to home as per PACU criteria.  Routine postoperative instructions given.  She was prescribed Percocet, Ibuprofen and Colace.  She will follow up in the clinic in about 2-3 weeks for postoperative evaluation.   Jaynie CollinsUGONNA  Bookert Guzzi, MD, FACOG Attending Obstetrician & Gynecologist Faculty Practice, [facility] [place]

## 2016-03-28 NOTE — OR Nursing (Signed)
Dr. Erin FullingHarraway-Smith changed her mind on Rhogam.  Will give Rhogam in PACU.  Verified order.

## 2016-03-28 NOTE — MAU Note (Signed)
Dr. Erin FullingHarraway-Smith in to discuss U/S results and surgery. Questions answered.

## 2016-03-28 NOTE — H&P (Signed)
Preoperative History and Physical  Cheyenne Walton is a 25 y.o. [phone] here for surgical management of ectopic pregnancy.   Proposed surgery: laparoscopy with possible salpingectomy  Past Medical History:  Diagnosis Date  . 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 Asthma    "haven't used an inhaler in years"  . Chlamydia   . Ectopic pregnancy   . Obesity   . UTI (urinary tract infection)    Past Surgical History:  Procedure Laterality Date  . ECTOPIC PREGNANCY SURGERY    . LAPAROSCOPIC UNILATERAL SALPINGECTOMY     LEFT SIDE with ectopic pregnancy  . LAPAROSCOPY  09/13/2011   Procedure: LAPAROSCOPY OPERATIVE;  Surgeon: Tereso Newcomer, MD;  Location: [facility];  Service: Gynecology;  Laterality: Left;  operative laparoscopy left salpingectomy with removal of ectopic pregnancy.   OB History    Gravida Para Term Preterm AB Living   7 1 1  0 5 1   SAB TAB Ectopic Multiple Live Births   4 0 1 0 1     Patient denies any cervical dysplasia or STIs. No prescriptions prior to admission.    Allergies  Allergen Reactions  . Contrast Media [Iodinated Diagnostic Agents] Anaphylaxis  . Other Anaphylaxis, Rash and Other (See Comments)    Seafood - throat swelling  . Latex Hives   Social History:   reports that she has been smoking.  She has been smoking about 0.25 packs per day. She has never used smokeless tobacco. She reports that she drinks alcohol. She reports that she uses drugs, including Marijuana. Family History  Problem Relation Age of Onset  . Cancer Maternal Grandmother   . Anesthesia problems Neg Hx   . Hypotension Neg Hx   . Malignant hyperthermia Neg Hx   . Pseudochol deficiency Neg Hx     Review of Systems: Noncontributory  PHYSICAL EXAM: Blood pressure 120/66, pulse 66, temperature 98.1 F (36.7 C), temperature source Oral, resp. rate 18, height 5\' 3"  (1.6 m), weight  202 lb 8 oz (91.9 kg), last menstrual period 02/19/2016, SpO2 100 %. General appearance - alert, well appearing, and in no distress Chest - clear to auscultation, no wheezes, rales or rhonchi, symmetric air entry Heart - normal rate and regular rhythm Abdomen - soft, diffusely tender, nondistended, no masses or organomegaly. No rebound or guarding Pelvic - examination not indicated Extremities - peripheral pulses normal, no pedal edema, no clubbing or cyanosis  Labs: Results for orders placed or performed during the hospital encounter of 03/28/16 (from the past 336 hour(s))  hCG, quantitative, pregnancy   Collection Time: 03/28/16  5:58 AM  Result Value Ref Range   hCG, Beta Chain, Quant, S 4,413 (H) <5 mIU/mL  CBC   Collection Time: 03/28/16  5:58 AM  Result Value Ref Range   WBC 10.4 4.0 - 10.5 K/uL   RBC 4.14 3.87 - 5.11 MIL/uL   Hemoglobin 13.3 12.0 - 15.0 g/dL   HCT 45.4 09.8 - 11.9 %   MCV 92.8 78.0 - 100.0 fL   MCH 32.1 26.0 - 34.0 pg   MCHC 34.6 30.0 - 36.0 g/dL   RDW 14.7 82.9 - 56.2 %   Platelets 254 150 - 400 K/uL  Wet prep, genital   Collection Time: 03/28/16  6:10 AM  Result Value Ref Range   Yeast Wet Prep HPF POC NONE SEEN NONE SEEN  Trich, Wet Prep NONE SEEN NONE SEEN   Clue Cells Wet Prep HPF POC PRESENT (A) NONE SEEN   WBC, Wet Prep HPF POC MODERATE (A) NONE SEEN   Sperm NONE SEEN   Type and screen [facility]   Collection Time: 03/28/16  6:55 AM  Result Value Ref Range   ABO/RH(D) A NEG    Antibody Screen NEG    Sample Expiration 03/31/2016   Results for orders placed or performed during the hospital encounter of 03/17/16 (from the past 336 hour(s))  Urinalysis, Routine w reflex microscopic (not at [facility])   Collection Time: 03/17/16  2:53 PM  Result Value Ref Range   Color, Urine YELLOW YELLOW   APPearance CLEAR CLEAR   Specific Gravity, Urine 1.016 1.005 - 1.030   pH 7.0 5.0 - 8.0   Glucose, UA NEGATIVE NEGATIVE mg/dL   Hgb urine  dipstick TRACE (A) NEGATIVE   Bilirubin Urine NEGATIVE NEGATIVE   Ketones, ur NEGATIVE NEGATIVE mg/dL   Protein, ur NEGATIVE NEGATIVE mg/dL   Nitrite NEGATIVE NEGATIVE   Leukocytes, UA NEGATIVE NEGATIVE  Urine microscopic-add on   Collection Time: 03/17/16  2:53 PM  Result Value Ref Range   Squamous Epithelial / LPF 0-5 (A) NONE SEEN   WBC, UA 0-5 0 - 5 WBC/hpf   RBC / HPF 0-5 0 - 5 RBC/hpf   Bacteria, UA RARE (A) NONE SEEN   Urine-Other MUCOUS PRESENT   POC Urine Pregnancy, ED (do NOT order at [facility])   Collection Time: 03/17/16  3:00 PM  Result Value Ref Range   Preg Test, Ur POSITIVE (A) NEGATIVE  Comprehensive metabolic panel   Collection Time: 03/17/16  7:00 PM  Result Value Ref Range   Sodium 137 135 - 145 mmol/L   Potassium 4.1 3.5 - 5.1 mmol/L   Chloride 107 101 - 111 mmol/L   CO2 23 22 - 32 mmol/L   Glucose, Bld 88 65 - 99 mg/dL   BUN 8 6 - 20 mg/dL   Creatinine, Ser 1.61 0.44 - 1.00 mg/dL   Calcium 9.6 8.9 - 09.6 mg/dL   Total Protein 6.3 (L) 6.5 - 8.1 g/dL   Albumin 3.7 3.5 - 5.0 g/dL   AST 18 15 - 41 U/L   ALT 25 14 - 54 U/L   Alkaline Phosphatase 77 38 - 126 U/L   Total Bilirubin 0.6 0.3 - 1.2 mg/dL   GFR calc non Af Amer >60 >60 mL/min   GFR calc Af Amer >60 >60 mL/min   Anion gap 7 5 - 15  CBC with Differential   Collection Time: 03/17/16  7:00 PM  Result Value Ref Range   WBC 11.4 (H) 4.0 - 10.5 K/uL   RBC 4.54 3.87 - 5.11 MIL/uL   Hemoglobin 14.3 12.0 - 15.0 g/dL   HCT 04.5 40.9 - 81.1 %   MCV 96.3 78.0 - 100.0 fL   MCH 31.5 26.0 - 34.0 pg   MCHC 32.7 30.0 - 36.0 g/dL   RDW 91.4 78.2 - 95.6 %   Platelets 246 150 - 400 K/uL   Neutrophils Relative % 59 %   Neutro Abs 6.8 1.7 - 7.7 K/uL   Lymphocytes Relative 31 %   Lymphs Abs 3.5 0.7 - 4.0 K/uL   Monocytes Relative 7 %   Monocytes Absolute 0.8 0.1 - 1.0 K/uL   Eosinophils Relative 3 %   Eosinophils Absolute 0.3 0.0 - 0.7 K/uL   Basophils Relative 0 %  Basophils Absolute 0.0 0.0 - 0.1 K/uL  hCG,  quantitative, pregnancy   Collection Time: 03/17/16  7:00 PM  Result Value Ref Range   hCG, Beta Chain, Quant, S 456 (H) <5 mIU/mL    Imaging Studies: US Ob Comp Less 14 Wks  Result Date: 03/17/2016 CLINICAL DATA:  Pelvic pressure for 2 days positive urine pregnancy test EXAM: OBSTETRIC <14 WK [place] AND TRANSVAGINAL OB US TECHNIQUE: Both transabdominal and transvaginal ultrasound examinations were performed for complete evaluation of the gestation as well as the maternal uterus, adnexal regions, and pelvic cul-de-sac. Transvaginal technique was performed to assess early pregnancy. Quantitative HCG value 456. COMPARISON:  None. FINDINGS: Intrauterine gestational sac: Not clearly identified Yolk sac:  Not visualized Embryo:  Not visualized Cardiac Activity: Not visualized Subchorionic hemorrhage:  None visualized. Maternal uterus/adnexae: Uterus measures 8.5 x 3.8 x 3.8 cm. Endometrial thickness is 8.4 mm. Left ovary measures 4.4 x 2.7 x 3.7 cm with a volume of 22.4 cubic cm. Probable hemorrhagic left ovarian cyst measuring 2.9 x 1.8 x 1.9 cm. Right ovary measures 2.8 x 1.7 x 2.3 cm with a volume of 5.5 cubic cm. There is 1 x 0.9 x 1.1 cm cystic structure adjacent to or possibly arising from the right ovary. There is trace amount of fluid in the cul-de-sac. IMPRESSION: 1. No definite intrauterine gestational sac is visualized. There is a cyst or cystic structure adjacent to or possibly arising from the right ovary. While findings could relate to very early gestation given the beta HCG, ectopic pregnancy cannot be excluded. Recommend close clinical follow-up with monitoring of beta HCG and repeat ultrasound to document intrauterine pregnancy. 2. Trace amount of free fluid in the cul-de-sac. Electronically Signed   By: Jasmine Pang M.D.   On: 03/17/2016 21:51   US Ob Transvaginal  Result Date: 03/28/2016 CLINICAL DATA:  25 year old female with abdominal cramping. LMP 02/19/2016. EXAM: TRANSVAGINAL OB  ULTRASOUND TECHNIQUE: Transvaginal ultrasound was performed for complete evaluation of the gestation as well as the maternal uterus, adnexal regions, and pelvic cul-de-sac. COMPARISON:  Obstetrical ultrasound dated 03/17/2016 FINDINGS: The uterus is anteverted. No intrauterine pregnancy identified. There is small amount of fluid in the upper endometrial canal. A 2.1 x 1.4 x 0.4 cm fluid collection in the superior aspect of the endometrium may represent a pseudo sac. The right ovary appears unremarkable. There is a 3.1 x 1.7 x 2.8 cm complex solid mass containing a gestational sac, a yolk sac, and fetal pole inferior and medial to the right ovary compatible with an ectopic pregnancy. Fetal cardiac activity detected at 87 beats per minute. The estimated gestational age based on crown-rump length of 3 mm is 5 weeks, 5 days. The left ovary is not visualized. Small amount of free fluid noted within the pelvis. Ill-defined echogenic content in the cul-de-sac and posterior to the uterus likely represent blood clot. IMPRESSION: Right adnexal ectopic pregnancy with a live fetal pole with a heart rate of 87 beats per minute. Small free fluid and probable clot within the pelvis. These results were called by telephone at the time of interpretation on 03/28/2016 at 6:46 am to Dr. Willodean Rosenthal , who verbally acknowledged these results. Electronically Signed   By: Elgie Collard M.D.   On: 03/28/2016 06:54   US Ob Transvaginal  Result Date: 03/17/2016 CLINICAL DATA:  Pelvic pressure for 2 days positive urine pregnancy test EXAM: OBSTETRIC <14 WK [place] AND TRANSVAGINAL OB US TECHNIQUE: Both transabdominal and transvaginal ultrasound examinations were performed  for complete evaluation of the gestation as well as the maternal uterus, adnexal regions, and pelvic cul-de-sac. Transvaginal technique was performed to assess early pregnancy. Quantitative HCG value 456. COMPARISON:  None. FINDINGS: Intrauterine gestational sac:  Not clearly identified Yolk sac:  Not visualized Embryo:  Not visualized Cardiac Activity: Not visualized Subchorionic hemorrhage:  None visualized. Maternal uterus/adnexae: Uterus measures 8.5 x 3.8 x 3.8 cm. Endometrial thickness is 8.4 mm. Left ovary measures 4.4 x 2.7 x 3.7 cm with a volume of 22.4 cubic cm. Probable hemorrhagic left ovarian cyst measuring 2.9 x 1.8 x 1.9 cm. Right ovary measures 2.8 x 1.7 x 2.3 cm with a volume of 5.5 cubic cm. There is 1 x 0.9 x 1.1 cm cystic structure adjacent to or possibly arising from the right ovary. There is trace amount of fluid in the cul-de-sac. IMPRESSION: 1. No definite intrauterine gestational sac is visualized. There is a cyst or cystic structure adjacent to or possibly arising from the right ovary. While findings could relate to very early gestation given the beta HCG, ectopic pregnancy cannot be excluded. Recommend close clinical follow-up with monitoring of beta HCG and repeat ultrasound to document intrauterine pregnancy. 2. Trace amount of free fluid in the cul-de-sac. Electronically Signed   By: Jasmine PangKim  Fujinaga M.D.   On: 03/17/2016 21:51    Assessment: Patient Active Problem List   Diagnosis Date Noted  . 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   The risks of surgery were discussed in detail with the patient including but not limited to: bleeding which may require transfusion or reoperation; infection which may require antibiotics; injury to surrounding organs which may involve bowel, bladder, ureters ; need for additional procedures including laparoscopy or laparotomy; thromboembolic phenomenon, surgical site problems and other postoperative/anesthesia complications. Likelihood of success in alleviating the patient's condition was discussed. I have informed patinet that if we cannot preserve her  fallopian tube that will render her sterile.  She is aware.  Postoperative instructions will be reviewed with the patient and her family in detail after surgery.  The patient concurred with the proposed plan, giving informed written consent for the surgery.  Patient has been NPO since last night she will remain NPO for procedure.  Anesthesia and OR aware.  Preoperative prophylactic antibiotics and SCDs ordered on call to the OR.  To OR when ready.  Eward Rutigliano L. Erin FullingHarraway-Smith, M.D., [facility] 03/28/2016 9:33 AM

## 2016-03-28 NOTE — MAU Note (Signed)
To OR

## 2016-03-28 NOTE — Transfer of Care (Signed)
Immediate Anesthesia Transfer of Care Note  Patient: Cheyenne Walton  Procedure(s) Performed: Procedure(s): LAPAROSCOPY OPERATIVE (N/A)  Patient Location: PACU  Anesthesia Type:General  Level of Consciousness: awake, alert , oriented and patient cooperative  Airway & Oxygen Therapy: Patient Spontanous Breathing and Patient connected to nasal cannula oxygen  Post-op Assessment: Report given to RN and Post -op Vital signs reviewed and stable  Post vital signs: Reviewed and stable  Last Vitals:  Vitals:   03/28/16 0638 03/28/16 0922  BP: 129/76 120/66  Pulse: 86 66  Resp: 20 18  Temp:  36.7 C    Last Pain:  Vitals:   03/28/16 0924  TempSrc:   PainSc: 6       Patients Stated Pain Goal: 0 (03/28/16 0924)  Complications: No apparent anesthesia complications

## 2016-03-29 LAB — RH IG WORKUP (INCLUDES ABO/RH)
ABO/RH(D): A NEG
GESTATIONAL AGE(WKS): 5
Unit division: 0

## 2016-03-31 ENCOUNTER — Encounter (HOSPITAL_COMMUNITY): Payer: Self-pay | Admitting: Obstetrics & Gynecology

## 2016-04-01 ENCOUNTER — Encounter: Payer: Self-pay | Admitting: Obstetrics & Gynecology

## 2016-04-03 ENCOUNTER — Other Ambulatory Visit: Payer: Self-pay

## 2016-04-09 ENCOUNTER — Telehealth: Payer: Self-pay | Admitting: *Deleted

## 2016-04-09 NOTE — Telephone Encounter (Signed)
Called pt and got no answer.  Pt DNKA on 10/26 for necessary BHCG to follow up salpingostomy due to ectopic pregnancy. This test is important in order to assess for residual tissue since the affected fallopian tube was not removed. Pt has follow up office appt on 11/8 and needs the BHCG ASAP.

## 2016-04-16 ENCOUNTER — Ambulatory Visit: Payer: Self-pay | Admitting: Obstetrics & Gynecology

## 2016-04-17 ENCOUNTER — Encounter: Payer: Self-pay | Admitting: *Deleted

## 2016-04-17 NOTE — Telephone Encounter (Signed)
Pt did not keep appointment on 11/8. Certified letter sent to patient.

## 2017-01-31 ENCOUNTER — Encounter (HOSPITAL_COMMUNITY): Payer: Self-pay

## 2017-03-03 ENCOUNTER — Encounter (HOSPITAL_COMMUNITY): Payer: Self-pay | Admitting: Emergency Medicine

## 2017-03-03 DIAGNOSIS — F17200 Nicotine dependence, unspecified, uncomplicated: Secondary | ICD-10-CM | POA: Insufficient documentation

## 2017-03-03 DIAGNOSIS — J45909 Unspecified asthma, uncomplicated: Secondary | ICD-10-CM | POA: Diagnosis not present

## 2017-03-03 DIAGNOSIS — B9689 Other specified bacterial agents as the cause of diseases classified elsewhere: Secondary | ICD-10-CM | POA: Diagnosis not present

## 2017-03-03 DIAGNOSIS — N760 Acute vaginitis: Secondary | ICD-10-CM | POA: Insufficient documentation

## 2017-03-03 DIAGNOSIS — B373 Candidiasis of vulva and vagina: Secondary | ICD-10-CM | POA: Insufficient documentation

## 2017-03-03 DIAGNOSIS — Z91040 Latex allergy status: Secondary | ICD-10-CM | POA: Diagnosis not present

## 2017-03-03 DIAGNOSIS — N898 Other specified noninflammatory disorders of vagina: Secondary | ICD-10-CM | POA: Diagnosis present

## 2017-03-03 NOTE — ED Triage Notes (Addendum)
Pt reports she has recently changed her body wash and she believes it has caused her to develop a yeast infection. States she has had thick, cottage cheese like DC for past few days and her labia are swollen and tender. Pt does report recently having unprotected sex.

## 2017-03-04 ENCOUNTER — Emergency Department (HOSPITAL_COMMUNITY)
Admission: EM | Admit: 2017-03-04 | Discharge: 2017-03-04 | Disposition: A | Discharge status: Home / Self Care | Payer: Medicaid Other | Attending: Emergency Medicine | Admitting: Emergency Medicine

## 2017-03-04 ENCOUNTER — Encounter (HOSPITAL_COMMUNITY): Payer: Self-pay | Admitting: Emergency Medicine

## 2017-03-04 DIAGNOSIS — B9689 Other specified bacterial agents as the cause of diseases classified elsewhere: Secondary | ICD-10-CM

## 2017-03-04 DIAGNOSIS — N760 Acute vaginitis: Secondary | ICD-10-CM

## 2017-03-04 DIAGNOSIS — B3731 Acute candidiasis of vulva and vagina: Secondary | ICD-10-CM

## 2017-03-04 DIAGNOSIS — B373 Candidiasis of vulva and vagina: Secondary | ICD-10-CM

## 2017-03-04 LAB — URINALYSIS, ROUTINE W REFLEX MICROSCOPIC
BILIRUBIN URINE: NEGATIVE
Glucose, UA: NEGATIVE mg/dL
HGB URINE DIPSTICK: NEGATIVE
KETONES UR: NEGATIVE mg/dL
Nitrite: NEGATIVE
PROTEIN: NEGATIVE mg/dL
SPECIFIC GRAVITY, URINE: 1.021 (ref 1.005–1.030)
pH: 6 (ref 5.0–8.0)

## 2017-03-04 LAB — WET PREP, GENITAL
Sperm: NONE SEEN
TRICH WET PREP: NONE SEEN

## 2017-03-04 LAB — GC/CHLAMYDIA PROBE AMP (~~LOC~~) NOT AT ARMC
CHLAMYDIA, DNA PROBE: NEGATIVE
Neisseria Gonorrhea: NEGATIVE

## 2017-03-04 LAB — HIV ANTIBODY (ROUTINE TESTING W REFLEX): HIV SCREEN 4TH GENERATION: NONREACTIVE

## 2017-03-04 LAB — POC URINE PREG, ED: Preg Test, Ur: NEGATIVE

## 2017-03-04 LAB — RPR: RPR Ser Ql: NONREACTIVE

## 2017-03-04 MED ORDER — METRONIDAZOLE 500 MG PO TABS
500.0000 mg | ORAL_TABLET | Freq: Once | ORAL | Status: AC
  Administered 2017-03-04: 500 mg via ORAL
  Filled 2017-03-04: qty 1

## 2017-03-04 MED ORDER — FLUCONAZOLE 100 MG PO TABS
200.0000 mg | ORAL_TABLET | Freq: Once | ORAL | Status: AC
  Administered 2017-03-04: 200 mg via ORAL
  Filled 2017-03-04: qty 2

## 2017-03-04 MED ORDER — FLUCONAZOLE 200 MG PO TABS: 200.0000 mg | ORAL_TABLET | Freq: Once | ORAL | 0 refills | Status: AC

## 2017-03-04 MED ORDER — METRONIDAZOLE 500 MG PO TABS: 500.0000 mg | ORAL_TABLET | Freq: Two times a day (BID) | ORAL | 0 refills | Status: DC

## 2017-03-04 NOTE — ED Provider Notes (Signed)
[facility]-EMERGENCY DEPT Provider Note   CSN: [id number] Arrival date & time: 03/03/17  2235     History   Chief Complaint Chief Complaint  Patient presents with  . Vaginal Discharge    HPI Cheyenne Walton is a 26 y.o. female.  HPI   Cheyenne Walton is a 26 y.o. female, with a history of chlamydia, presenting to the ED with abnormal vaginal discharge beginning 9/24. Started using a new body wash the same day prior to the onset of her complaint. Patient also began to have labial swelling, pain, and itching. Discharge is thick and white.   Sexually active with one female partner, but is unsure if her partner is monogamous.  Denies fever/chills, N/V/D, abdominal pain, dysuria/hematuria, or any other complaints.       Past Medical History:  Diagnosis Date  . 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 Asthma    "haven't used an inhaler in years"  . Chlamydia   . Ectopic pregnancy   . Obesity   . Right fallopian tube ectopic pregnancy s/p salpingostomy 03/28/2016  . UTI (urinary tract infection)     Patient Active Problem List   Diagnosis Date Noted  . Right fallopian tube ectopic pregnancy s/p salpingostomy 03/28/2016  . 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 ECTOPIC PREGNANCY SURGERY    . LAPAROSCOPIC UNILATERAL SALPINGECTOMY     LEFT SIDE with ectopic pregnancy  . LAPAROSCOPY  09/13/2011   Procedure: LAPAROSCOPY OPERATIVE;  Surgeon: Tereso Newcomer, MD;  Location: [facility];  Service: Gynecology;  Laterality: Left;  operative laparoscopy left salpingectomy with removal of ectopic pregnancy.  Marland Kitchen LAPAROSCOPY N/A 03/28/2016   Procedure: LAPAROSCOPY OPERATIVE With Right Salpingostomy and Removal of Right Ectopic Pregnancy;  Surgeon: Tereso Newcomer, MD;  Location: [facility];  Service: Gynecology;   Laterality: N/A;    OB History    Gravida Para Term Preterm AB Living   0 5 1   SAB TAB Ectopic Multiple Live Births   4 0 1 0 1       Home Medications    Prior to Admission medications   Medication Sig Start Date End Date Taking? Authorizing Provider  docusate sodium (COLACE) 100 MG capsule Take 1 capsule (100 mg total) by mouth 2 (two) times daily as needed. Patient not taking: Reported on 03/04/2017 03/28/16   Tereso Newcomer, MD  fluconazole (DIFLUCAN) 200 MG tablet Take 1 tablet (200 mg total) by mouth once. 03/04/17 03/04/17  Joy, Shawn C, PA-C  ibuprofen (ADVIL,MOTRIN) 600 MG tablet Take 1 tablet (600 mg total) by mouth every 6 (six) hours as needed. Patient not taking: Reported on 03/04/2017 03/28/16   Tereso Newcomer, MD  metroNIDAZOLE (FLAGYL) 500 MG tablet Take 1 tablet (500 mg total) by mouth 2 (two) times daily. 03/04/17   Joy, Shawn C, PA-C  oxyCODONE-acetaminophen (PERCOCET/ROXICET) 5-325 MG tablet Take 1-2 tablets by mouth every 6 (six) hours as needed. Patient not taking: Reported on 03/04/2017 03/28/16   Tereso Newcomer, MD    Family History Family History  Problem Relation Age of Onset  . Cancer Maternal Grandmother   . Anesthesia problems Neg Hx   . Hypotension Neg Hx   . Malignant hyperthermia Neg Hx   . Pseudochol deficiency Neg Hx  Social History Social History  Substance Use Topics  . Smoking status: Current Every Day Smoker    Packs/day: 0.25    Last attempt to quit: 04/08/2012  . Smokeless tobacco: Never Used  . Alcohol use Yes     Comment: yesterday     Allergies   Contrast media [iodinated diagnostic agents]; Other; and Latex   Review of Systems Review of Systems  Constitutional: Negative for chills and fever.  Gastrointestinal: Negative for abdominal pain, nausea and vomiting.  Genitourinary: Positive for vaginal discharge. Negative for dysuria, flank pain, frequency, hematuria and pelvic pain.  Musculoskeletal: Negative  for back pain.  All other systems reviewed and are negative.    Physical Exam Updated Vital Signs BP 121/78   Pulse 70   Temp 98.3 F (36.8 C)   Resp 18   Ht  (1.626 m)   Wt 94.3 kg (208 lb)   SpO2 100%   BMI 35.70 kg/m   Physical Exam  Constitutional: She appears well-developed and well-nourished. No distress.  HENT:  Head: Normocephalic and atraumatic.  Eyes: Conjunctivae are normal.  Neck: Neck supple.  Cardiovascular: Normal rate, regular rhythm and intact distal pulses.   Pulmonary/Chest: Effort normal. No respiratory distress.  Abdominal: Soft. There is no tenderness. There is no guarding.  Genitourinary:  Genitourinary Comments: External genitalia abnormal - Slight tenderness to labia minora bilaterally. No noted swelling, erythema, or lesions. Vagina with discharge - Thick, white curd-like discharge Cervix  normal negative for cervical motion tenderness Adnexa palpated, no masses, negative for tenderness noted Bladder palpated negative for tenderness Uterus palpated no masses, negative for tenderness  No inguinal lymphadenopathy. Otherwise normal female genitalia. RN, Delorise Jackson, served as Biomedical engineer during exam.  Musculoskeletal: She exhibits no edema.  Lymphadenopathy:    She has no cervical adenopathy.  Neurological: She is alert.  Skin: Skin is warm and dry. She is not diaphoretic.  Psychiatric: She has a normal mood and affect. Her behavior is normal.  Nursing note and vitals reviewed.    ED Treatments / Results  Labs (all labs ordered are listed, but only abnormal results are displayed) Labs Reviewed  WET PREP, GENITAL - Abnormal; Notable for the following:       Result Value   Yeast Wet Prep HPF POC PRESENT (*)    Clue Cells Wet Prep HPF POC PRESENT (*)    WBC, Wet Prep HPF POC MANY (*)    All other components within normal limits  URINALYSIS, ROUTINE W REFLEX MICROSCOPIC - Abnormal; Notable for the following:    Leukocytes, UA LARGE (*)     Bacteria, UA RARE (*)    Squamous Epithelial / LPF 0-5 (*)    All other components within normal limits  RPR  HIV ANTIBODY (ROUTINE TESTING)  POC URINE PREG, ED  GC/CHLAMYDIA PROBE AMP ([place]) NOT AT [facility]    EKG  EKG Interpretation None       Radiology No results found.  Procedures Procedures (including critical care time)  Medications Ordered in ED Medications  fluconazole (DIFLUCAN) tablet 200 mg (not administered)  metroNIDAZOLE (FLAGYL) tablet 500 mg (not administered)     Initial Impression / Assessment and Plan / ED Course  I have reviewed the triage vital signs and the nursing notes.  Pertinent labs & imaging results that were available during my care of the patient were reviewed by me and considered in my medical decision making (see chart for details).     Patient presents with complaint  of vaginal discharge. Low suspicion for STD. Bacterial vaginosis and vaginal yeast infection on wet prep. OB/GYN follow-up as needed. Resources given. The patient was given instructions for home care as well as return precautions. Patient voices understanding of these instructions, accepts the plan, and is comfortable with discharge.  Final Clinical Impressions(s) / ED Diagnoses   Final diagnoses:  Vaginal yeast infection  BV (bacterial vaginosis)    New Prescriptions New Prescriptions   FLUCONAZOLE (DIFLUCAN) 200 MG TABLET    Take 1 tablet (200 mg total) by mouth once.   METRONIDAZOLE (FLAGYL) 500 MG TABLET    Take 1 tablet (500 mg total) by mouth 2 (two) times daily.     Anselm Pancoast, PA-C 03/04/17 1610    Dione Booze, MD 03/04/17 [phone]

## 2017-03-04 NOTE — Discharge Instructions (Signed)
You have evidence of a vaginal yeast infection as well as bacterial vaginosis.  Bacterial vaginosis: Please take all of your antibiotics until finished!   You may develop abdominal discomfort or diarrhea from the antibiotic.  You may help offset this with probiotics which you can buy or get in yogurt. Do not eat or take the probiotics until 2 hours after your antibiotic.   Yeast infection: You have been given the first dose of the antifungal medication. If you are still having symptoms in three days, you may take the dose for which you have been given a written prescription.

## 2017-11-01 ENCOUNTER — Encounter (HOSPITAL_COMMUNITY): Payer: Self-pay | Admitting: *Deleted

## 2017-11-01 ENCOUNTER — Emergency Department (HOSPITAL_COMMUNITY)
Admission: EM | Admit: 2017-11-01 | Discharge: 2017-11-01 | Disposition: A | Discharge status: Home / Self Care | Payer: Medicaid Other | Attending: Emergency Medicine | Admitting: Emergency Medicine

## 2017-11-01 ENCOUNTER — Emergency Department (HOSPITAL_COMMUNITY): Payer: Medicaid Other

## 2017-11-01 ENCOUNTER — Other Ambulatory Visit: Payer: Self-pay

## 2017-11-01 DIAGNOSIS — F17210 Nicotine dependence, cigarettes, uncomplicated: Secondary | ICD-10-CM | POA: Insufficient documentation

## 2017-11-01 DIAGNOSIS — Y999 Unspecified external cause status: Secondary | ICD-10-CM | POA: Diagnosis not present

## 2017-11-01 DIAGNOSIS — Z91040 Latex allergy status: Secondary | ICD-10-CM | POA: Diagnosis not present

## 2017-11-01 DIAGNOSIS — Y92009 Unspecified place in unspecified non-institutional (private) residence as the place of occurrence of the external cause: Secondary | ICD-10-CM | POA: Diagnosis not present

## 2017-11-01 DIAGNOSIS — S6992XA Unspecified injury of left wrist, hand and finger(s), initial encounter: Secondary | ICD-10-CM | POA: Diagnosis present

## 2017-11-01 DIAGNOSIS — Z79899 Other long term (current) drug therapy: Secondary | ICD-10-CM | POA: Diagnosis not present

## 2017-11-01 DIAGNOSIS — Y939 Activity, unspecified: Secondary | ICD-10-CM | POA: Diagnosis not present

## 2017-11-01 DIAGNOSIS — S63642A Sprain of metacarpophalangeal joint of left thumb, initial encounter: Secondary | ICD-10-CM | POA: Diagnosis not present

## 2017-11-01 DIAGNOSIS — W19XXXA Unspecified fall, initial encounter: Secondary | ICD-10-CM | POA: Diagnosis not present

## 2017-11-01 MED ORDER — ACETAMINOPHEN 500 MG PO TABS: 1000.0000 mg | ORAL_TABLET | Freq: Once | ORAL | Status: DC

## 2017-11-01 NOTE — ED Notes (Signed)
Pt left prior to receiving discharge paper work or medication. No distress noted throughout ER visit, stable and ambulatory for discharge.

## 2017-11-01 NOTE — ED Triage Notes (Signed)
Pt reports wrestling yesterday and injured left thumb and now has pain and swelling.
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
## 2017-11-01 NOTE — Discharge Instructions (Signed)
Thank you for allowing me to provide your care today in the emergency department.  Your x-ray was negative for a fracture/broken bone.  Take 1000 mg of Tylenol every 8 hours as needed for pain control.  You can also take 600 mg of ibuprofen with food; however, do not take this medication if there is any concern that you could be pregnant.  Apply ice for 15 to 20 minutes up to 3-4 times a day.  There are several braces available for thumb injuries.  [employer] has a Dietitian available.  Call the number on your discharge paperwork to get established with a primary care provider.  You can follow-up with primary care if your symptoms do not start to improve with the regimen above in 1 week.  Return to the emergency department if you develop new or worsening symptoms including another injury, if your fingertips turns blue, or if the hand or finger becomes red, hot to the touch, or swollen.

## 2017-11-07 ENCOUNTER — Emergency Department (HOSPITAL_COMMUNITY): Payer: No Typology Code available for payment source

## 2017-11-07 ENCOUNTER — Encounter (HOSPITAL_COMMUNITY): Payer: Self-pay

## 2017-11-07 ENCOUNTER — Emergency Department (HOSPITAL_COMMUNITY)
Admission: EM | Admit: 2017-11-07 | Discharge: 2017-11-07 | Disposition: A | Discharge status: Home / Self Care | Payer: No Typology Code available for payment source | Attending: Emergency Medicine | Admitting: Emergency Medicine

## 2017-11-07 DIAGNOSIS — Y92410 Unspecified street and highway as the place of occurrence of the external cause: Secondary | ICD-10-CM | POA: Insufficient documentation

## 2017-11-07 DIAGNOSIS — S4991XA Unspecified injury of right shoulder and upper arm, initial encounter: Secondary | ICD-10-CM | POA: Diagnosis present

## 2017-11-07 DIAGNOSIS — J45909 Unspecified asthma, uncomplicated: Secondary | ICD-10-CM | POA: Diagnosis not present

## 2017-11-07 DIAGNOSIS — Y998 Other external cause status: Secondary | ICD-10-CM | POA: Insufficient documentation

## 2017-11-07 DIAGNOSIS — Y9389 Activity, other specified: Secondary | ICD-10-CM | POA: Insufficient documentation

## 2017-11-07 DIAGNOSIS — Z91040 Latex allergy status: Secondary | ICD-10-CM | POA: Insufficient documentation

## 2017-11-07 DIAGNOSIS — S40011A Contusion of right shoulder, initial encounter: Secondary | ICD-10-CM | POA: Diagnosis not present

## 2017-11-07 DIAGNOSIS — F17200 Nicotine dependence, unspecified, uncomplicated: Secondary | ICD-10-CM | POA: Diagnosis not present

## 2017-11-07 DIAGNOSIS — S20211A Contusion of right front wall of thorax, initial encounter: Secondary | ICD-10-CM | POA: Insufficient documentation

## 2017-11-07 LAB — POC URINE PREG, ED: PREG TEST UR: NEGATIVE

## 2017-11-07 MED ORDER — KETOROLAC TROMETHAMINE 60 MG/2ML IM SOLN
30.0000 mg | Freq: Once | INTRAMUSCULAR | Status: AC
  Administered 2017-11-07: 30 mg via INTRAMUSCULAR
  Filled 2017-11-07: qty 2

## 2017-11-07 MED ORDER — METHOCARBAMOL 500 MG PO TABS
500.0000 mg | ORAL_TABLET | Freq: Once | ORAL | Status: AC
  Administered 2017-11-07: 500 mg via ORAL
  Filled 2017-11-07: qty 1

## 2017-11-07 MED ORDER — HYDROCODONE-ACETAMINOPHEN 5-325 MG PO TABS
1.0000 | ORAL_TABLET | Freq: Once | ORAL | Status: AC
  Administered 2017-11-07: 1 via ORAL
  Filled 2017-11-07: qty 1

## 2017-11-07 MED ORDER — CYCLOBENZAPRINE HCL 10 MG PO TABS: 10.0000 mg | ORAL_TABLET | Freq: Two times a day (BID) | ORAL | 0 refills | Status: AC | PRN

## 2017-11-07 MED ORDER — MELOXICAM 15 MG PO TABS: 15.0000 mg | ORAL_TABLET | Freq: Every day | ORAL | 0 refills | Status: DC

## 2017-11-07 NOTE — ED Notes (Signed)
Pt verbalized understanding discharge instructions and denies any further needs or questions at this time. VS stable, ambulatory and steady gait.   
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
## 2017-11-07 NOTE — Discharge Instructions (Addendum)
Ice your shoulder several times a day.  Mobic for pain and inflammation.  Flexeril for muscle spasms.  Avoid strenuous activity, however also do not stay on the couch or bed all day.  Move around and stretch.  Take medications as prescribed.  Follow-up with family doctor as needed.  Return if worsening symptoms.
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
## 2017-11-10 ENCOUNTER — Emergency Department (HOSPITAL_COMMUNITY)
Admission: EM | Admit: 2017-11-10 | Discharge: 2017-11-10 | Disposition: A | Discharge status: Home / Self Care | Payer: Self-pay | Attending: Emergency Medicine | Admitting: Emergency Medicine

## 2017-11-10 ENCOUNTER — Encounter (HOSPITAL_COMMUNITY): Payer: Self-pay | Admitting: Emergency Medicine

## 2017-11-10 DIAGNOSIS — Z79899 Other long term (current) drug therapy: Secondary | ICD-10-CM | POA: Insufficient documentation

## 2017-11-10 DIAGNOSIS — Y998 Other external cause status: Secondary | ICD-10-CM | POA: Insufficient documentation

## 2017-11-10 DIAGNOSIS — F332 Major depressive disorder, recurrent severe without psychotic features: Secondary | ICD-10-CM | POA: Insufficient documentation

## 2017-11-10 DIAGNOSIS — Z915 Personal history of self-harm: Secondary | ICD-10-CM | POA: Insufficient documentation

## 2017-11-10 DIAGNOSIS — Y9389 Activity, other specified: Secondary | ICD-10-CM | POA: Insufficient documentation

## 2017-11-10 DIAGNOSIS — Z91040 Latex allergy status: Secondary | ICD-10-CM | POA: Insufficient documentation

## 2017-11-10 DIAGNOSIS — Y92009 Unspecified place in unspecified non-institutional (private) residence as the place of occurrence of the external cause: Secondary | ICD-10-CM | POA: Insufficient documentation

## 2017-11-10 DIAGNOSIS — X781XXA Intentional self-harm by knife, initial encounter: Secondary | ICD-10-CM | POA: Insufficient documentation

## 2017-11-10 DIAGNOSIS — F603 Borderline personality disorder: Secondary | ICD-10-CM | POA: Insufficient documentation

## 2017-11-10 DIAGNOSIS — IMO0002 Reserved for concepts with insufficient information to code with codable children: Secondary | ICD-10-CM

## 2017-11-10 DIAGNOSIS — S51812A Laceration without foreign body of left forearm, initial encounter: Secondary | ICD-10-CM | POA: Insufficient documentation

## 2017-11-10 DIAGNOSIS — J45909 Unspecified asthma, uncomplicated: Secondary | ICD-10-CM | POA: Insufficient documentation

## 2017-11-10 DIAGNOSIS — F17200 Nicotine dependence, unspecified, uncomplicated: Secondary | ICD-10-CM | POA: Insufficient documentation

## 2017-11-10 LAB — CBC
HCT: 40.9 % (ref 36.0–46.0)
Hemoglobin: 13.8 g/dL (ref 12.0–15.0)
MCH: 31.6 pg (ref 26.0–34.0)
MCHC: 33.7 g/dL (ref 30.0–36.0)
MCV: 93.6 fL (ref 78.0–100.0)
PLATELETS: 290 10*3/uL (ref 150–400)
RBC: 4.37 MIL/uL (ref 3.87–5.11)
RDW: 13.5 % (ref 11.5–15.5)
WBC: 11.5 10*3/uL — AB (ref 4.0–10.5)

## 2017-11-10 LAB — COMPREHENSIVE METABOLIC PANEL
ALK PHOS: 76 U/L (ref 38–126)
ALT: 13 U/L — ABNORMAL LOW (ref 14–54)
ANION GAP: 9 (ref 5–15)
AST: 18 U/L (ref 15–41)
Albumin: 4 g/dL (ref 3.5–5.0)
BILIRUBIN TOTAL: 0.7 mg/dL (ref 0.3–1.2)
BUN: 8 mg/dL (ref 6–20)
CALCIUM: 9.4 mg/dL (ref 8.9–10.3)
CO2: 23 mmol/L (ref 22–32)
Chloride: 108 mmol/L (ref 101–111)
Creatinine, Ser: 0.84 mg/dL (ref 0.44–1.00)
GFR calc non Af Amer: >60 mL/min (ref >60)
Glucose, Bld: 90 mg/dL (ref 65–99)
Potassium: 3.6 mmol/L (ref 3.5–5.1)
SODIUM: 140 mmol/L (ref 135–145)
TOTAL PROTEIN: 7.1 g/dL (ref 6.5–8.1)

## 2017-11-10 LAB — I-STAT BETA HCG BLOOD, ED (MC, WL, AP ONLY): I-stat hCG, quantitative: <5 m[IU]/mL (ref <5)

## 2017-11-10 LAB — ETHANOL: Alcohol, Ethyl (B): <10 mg/dL (ref <10)

## 2017-11-10 MED ORDER — CYCLOBENZAPRINE HCL 10 MG PO TABS: 10.0000 mg | ORAL_TABLET | Freq: Two times a day (BID) | ORAL | Status: DC | PRN

## 2017-11-10 MED ORDER — LIDOCAINE-EPINEPHRINE (PF) 2 %-1:200000 IJ SOLN
20.0000 mL | Freq: Once | INTRAMUSCULAR | Status: AC
  Administered 2017-11-10: 20 mL
  Filled 2017-11-10: qty 20

## 2017-11-10 MED ORDER — MELOXICAM 7.5 MG PO TABS
15.0000 mg | ORAL_TABLET | Freq: Every day | ORAL | Status: DC
  Filled 2017-11-10: qty 2

## 2017-11-10 NOTE — Discharge Instructions (Signed)
Please read attached information. If you experience any new or worsening signs or symptoms please return to the emergency room for evaluation. Please follow-up with your primary care provider or specialist as discussed.  °

## 2017-11-10 NOTE — ED Notes (Signed)
Regular Diet was ordered for Dinner. 

## 2017-11-10 NOTE — ED Provider Notes (Signed)
[facility] EMERGENCY DEPARTMENT Provider Note   CSN: [id number] Arrival date & time: 11/10/17  1153     History   Chief Complaint Chief Complaint  Patient presents with  . Mental Health Problem    HPI Cheyenne Walton is a 27 y.o. female.  HPI   27 year old female presents today with laceration to her left arm.  Patient notes she was very angry and has a tattoo of the significant other on her arm.  She notes she tried to disfigured her arm in the tattoo.  Patient reports she was not trying to kill herself, she does note self harming behavior in the past with attempted suicide in the past.  She denies any suicidal homicidal ideations presently.  No drugs or alcohol today.  Patient is on certain of her last tetanus vaccination.   Past Medical History:  Diagnosis Date  . ASCUS with positive high risk HPV cervical pap smear 02/21/2015   Repeat cotesting in one year as per ASCCP guidelines given age <77  . Asthma    "haven't used an inhaler in years"  . Chlamydia   . Ectopic pregnancy   . Obesity   . Right fallopian tube ectopic pregnancy s/p salpingostomy 03/28/2016  . UTI (urinary tract infection)     Patient Active Problem List   Diagnosis Date Noted  . Right fallopian tube ectopic pregnancy s/p salpingostomy 03/28/2016  . 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 ECTOPIC PREGNANCY SURGERY    . LAPAROSCOPIC UNILATERAL SALPINGECTOMY     LEFT SIDE with ectopic pregnancy  . LAPAROSCOPY  09/13/2011   Procedure: LAPAROSCOPY OPERATIVE;  Surgeon: Tereso Newcomer, MD;  Location: [facility];  Service: Gynecology;  Laterality: Left;  operative laparoscopy left salpingectomy with removal of ectopic pregnancy.  Marland Kitchen LAPAROSCOPY N/A 03/28/2016   Procedure: LAPAROSCOPY OPERATIVE With Right Salpingostomy and Removal of Right Ectopic Pregnancy;  Surgeon: Tereso Newcomer, MD;  Location: [facility];   Service: Gynecology;  Laterality: N/A;     OB History    Gravida  7   Para  1   Term  1   Preterm  0   AB  5   Living  1     SAB  4   TAB  0   Ectopic  1   Multiple  0   Live Births  1            Home Medications    Prior to Admission medications   Medication Sig Start Date End Date Taking? Authorizing Provider  cyclobenzaprine (FLEXERIL) 10 MG tablet Take 1 tablet (10 mg total) by mouth 2 (two) times daily as needed for muscle spasms. 11/07/17   Kirichenko, Tatyana, PA-C  docusate sodium (COLACE) 100 MG capsule Take 1 capsule (100 mg total) by mouth 2 (two) times daily as needed. Patient not taking: Reported on 03/04/2017 03/28/16   Tereso Newcomer, MD  ibuprofen (ADVIL,MOTRIN) 600 MG tablet Take 1 tablet (600 mg total) by mouth every 6 (six) hours as needed. Patient not taking: Reported on 03/04/2017 03/28/16   Tereso Newcomer, MD  meloxicam (MOBIC) 15 MG tablet Take 1 tablet (15 mg total) by mouth daily. 11/07/17   Kirichenko, Tatyana, PA-C  metroNIDAZOLE (FLAGYL) 500 MG tablet Take 1 tablet (500 mg total) by mouth 2 (two) times daily. 03/04/17   Joy, Shawn C, PA-C  oxyCODONE-acetaminophen (PERCOCET/ROXICET) 5-325 MG  tablet Take 1-2 tablets by mouth every 6 (six) hours as needed. Patient not taking: Reported on 03/04/2017 03/28/16   Tereso NewcomerAnyanwu, Ugonna A, MD    Family History Family History  Problem Relation Age of Onset  . Cancer Maternal Grandmother   . Anesthesia problems Neg Hx   . Hypotension Neg Hx   . Malignant hyperthermia Neg Hx   . Pseudochol deficiency Neg Hx     Social History Social History   Tobacco Use  . Smoking status: Current Every Day Smoker    Packs/day: 0.25    Last attempt to quit: 04/08/2012    Years since quitting: 5.5  . Smokeless tobacco: Never Used  Substance Use Topics  . Alcohol use: Yes    Comment: yesterday  . Drug use: Yes    Types: Marijuana    Comment: 2-3 days ago     Allergies   Contrast media [iodinated  diagnostic agents]; Other; and Latex   Review of Systems Review of Systems  All other systems reviewed and are negative.    Physical Exam Updated Vital Signs BP 120/60 (BP Location: Right Arm)   Pulse 84   Temp 98 F (36.7 C) (Oral)   Resp 16   LMP  (LMP Unknown)   SpO2 100%   Physical Exam  Constitutional: She is oriented to person, place, and time. She appears well-developed and well-nourished.  HENT:  Head: Normocephalic and atraumatic.  Eyes: Pupils are equal, round, and reactive to light. Conjunctivae are normal. Right eye exhibits no discharge. Left eye exhibits no discharge. No scleral icterus.  Neck: Normal range of motion. No JVD present. No tracheal deviation present.  Pulmonary/Chest: Effort normal. No stridor.  Musculoskeletal:  10 cm laceration to the left forearm no deep space involvement-separate 5 cm superficial laceration also noted-distal sensation strength and motor function intact  Neurological: She is alert and oriented to person, place, and time. Coordination normal.  Psychiatric: She has a normal mood and affect. Her behavior is normal. Judgment and thought content normal.  Nursing note and vitals reviewed.    ED Treatments / Results  Labs (all labs ordered are listed, but only abnormal results are displayed) Labs Reviewed  COMPREHENSIVE METABOLIC PANEL - Abnormal; Notable for the following components:      Result Value   ALT 13 (*)    All other components within normal limits  CBC - Abnormal; Notable for the following components:   WBC 11.5 (*)    All other components within normal limits  ETHANOL  I-STAT BETA HCG BLOOD, ED ([facility], [facility], [facility] ONLY)    EKG None  Radiology No results found.  Procedures .Marland Kitchen.Laceration Repair Date/Time: 11/10/2017 2:09 PM Performed by: Eyvonne MechanicHedges, Anahita Cua, PA-C Authorized by: Eyvonne MechanicHedges, Ahmira Boisselle, PA-C   Consent:    Consent obtained:  Verbal   Consent given by:  Patient   Risks discussed:  Infection, need for additional  repair, nerve damage, pain, poor cosmetic result, poor wound healing, tendon damage and vascular damage   Alternatives discussed:  No treatment and delayed treatment Anesthesia (see MAR for exact dosages):    Anesthesia method:  Local infiltration   Local anesthetic:  Lidocaine 2% WITH epi Laceration details:    Location: left forearm    Length (cm):  10 Repair type:    Repair type:  Simple Pre-procedure details:    Preparation:  Patient was prepped and draped in usual sterile fashion Exploration:    Wound exploration: wound explored through full range of motion and  entire depth of wound probed and visualized     Wound extent: no areolar tissue violation noted, no fascia violation noted, no foreign bodies/material noted, no muscle damage noted, no nerve damage noted, no tendon damage noted, no underlying fracture noted and no vascular damage noted     Contaminated: no   Treatment:    Area cleansed with:  Saline   Amount of cleaning:  Standard   Irrigation solution:  Sterile saline Skin repair:    Repair method:  Sutures   Suture size:  5-0   Suture material:  Fast-absorbing gut   Number of sutures:  15 Approximation:    Approximation:  Close Post-procedure details:    Dressing:  Non-adherent dressing   Patient tolerance of procedure:  Tolerated well, no immediate complications Comments:     Repair was performed by myself and Jan Fireman PA-S   (including critical care time)  Medications Ordered in ED Medications  cyclobenzaprine (FLEXERIL) tablet 10 mg (has no administration in time range)  meloxicam (MOBIC) tablet 15 mg (has no administration in time range)  lidocaine-EPINEPHrine (XYLOCAINE W/EPI) 2 %-1:200000 (PF) injection 20 mL (20 mLs Infiltration Given 11/10/17 1250)     Initial Impression / Assessment and Plan / ED Course  I have reviewed the triage vital signs and the nursing notes.  Pertinent labs & imaging results that were available during my care of the  patient were reviewed by me and considered in my medical decision making (see chart for details).     27 year old female presents today with self-harm behavior and laceration.  This was repaired without complication.  Tetanus is up-to-date per chart review.  Patient medically cleared.  Patient placed in pod F for TTS evaluation.  Patient was evaluated by TTS who recommends outpatient follow-up.  I find this reasonable as patient has no suicidal or homicidal ideations at this time.  Discharged with strict return precautions and wound care instructions.  She verbalized understanding and agreement to today's plan.  Final Clinical Impressions(s) / ED Diagnoses   Final diagnoses:  History of self-harm  Laceration of left forearm, initial encounter    ED Discharge Orders    None       Rosalio Loud 11/10/17 1729    Benjiman Core, MD 11/10/17 2219

## 2017-11-10 NOTE — ED Notes (Signed)
Dressing applied to left arm with bacitracin and telfa.

## 2017-11-10 NOTE — ED Notes (Signed)
Hedges, PA in to talk with patient and gave d/c instructions.  Resource list also given to patient.  Pt verbalized understanding.

## 2017-11-10 NOTE — Consult Note (Signed)
Tele Assessment   Cheyenne Walton, 27 y.o., [facility] herself on her forearm.  Patient seen via telepsych by this provider; chart reviewed and consulted with Dr. Lucianne MussKumar on 11/10/17.  On evaluation Cheyenne Walton reports she had an argument with her boyfriend after he was caught cheating.  Patient states that she has a tattoo with her boyfriends initials and she was just going to disfigure the initials but was using a razor and accidentally cut to deep and did not mean to.  States that she called her girlfriend to bring her to the hospital.  Patient denies suicidal ideation/self-harm/homicidal ideation, psychosis, and paranoia.   During evaluation Cheyenne Walton is alert/oriented x 4; calm/cooperative with pleasant affect.  She does not appear to be responding to internal/external stimuli or delusional thoughts.  Patient denies suicidal/self-harm/homicidal ideation, psychosis, and paranoia.  Patient answered question appropriately.  Patient cleared psychiatrically.   Recommendations:Outpatient psychiatric services.  Community referral/resources will be sent to patient  Disposition: No evidence of imminent risk to self or others at present.   Patient does not meet criteria for psychiatric inpatient admission.  Shuvon B. Rankin, NP

## 2017-11-10 NOTE — Progress Notes (Signed)
Faxed Outpatient Resources for pt; this list was for resources on a sliding scale for or for those with no insurance.

## 2017-11-10 NOTE — ED Notes (Signed)
Patient was given a [place] Sandwich bag w/ a Drink.

## 2017-11-10 NOTE — ED Notes (Signed)
Patient left arm started bleeding 2 2x2 applied w/ tape.

## 2017-11-10 NOTE — ED Triage Notes (Signed)
Pt arrives to ED seeking Mental health admission due to feeling depressed and anxious "I feel like if someone doesn't help me I am going to go crazy, I just have racing thoughts". Pt reports a recent breakup and this morning at 0600 she cut her left forearm with a razer in order to "remove a tattoo" Pt has a large laceration down back side of left forearm, no bleeding at this time. Wrapped with sterile gauze.
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
## 2017-11-10 NOTE — ED Notes (Signed)
Pt belonging is in locker 6 and has not been inventory

## 2018-05-19 ENCOUNTER — Emergency Department (HOSPITAL_COMMUNITY)
Admission: EM | Admit: 2018-05-19 | Discharge: 2018-05-19 | Disposition: A | Discharge status: Home / Self Care | Payer: Self-pay | Attending: Emergency Medicine | Admitting: Emergency Medicine

## 2018-05-19 ENCOUNTER — Other Ambulatory Visit: Payer: Self-pay

## 2018-05-19 ENCOUNTER — Encounter (HOSPITAL_COMMUNITY): Payer: Self-pay

## 2018-05-19 DIAGNOSIS — R197 Diarrhea, unspecified: Secondary | ICD-10-CM | POA: Insufficient documentation

## 2018-05-19 DIAGNOSIS — R112 Nausea with vomiting, unspecified: Secondary | ICD-10-CM | POA: Insufficient documentation

## 2018-05-19 DIAGNOSIS — Z91040 Latex allergy status: Secondary | ICD-10-CM | POA: Insufficient documentation

## 2018-05-19 DIAGNOSIS — R109 Unspecified abdominal pain: Secondary | ICD-10-CM

## 2018-05-19 DIAGNOSIS — R1030 Lower abdominal pain, unspecified: Secondary | ICD-10-CM | POA: Insufficient documentation

## 2018-05-19 DIAGNOSIS — F17200 Nicotine dependence, unspecified, uncomplicated: Secondary | ICD-10-CM | POA: Insufficient documentation

## 2018-05-19 DIAGNOSIS — N898 Other specified noninflammatory disorders of vagina: Secondary | ICD-10-CM | POA: Insufficient documentation

## 2018-05-19 DIAGNOSIS — R6883 Chills (without fever): Secondary | ICD-10-CM | POA: Insufficient documentation

## 2018-05-19 DIAGNOSIS — J45909 Unspecified asthma, uncomplicated: Secondary | ICD-10-CM | POA: Insufficient documentation

## 2018-05-19 LAB — CBC WITH DIFFERENTIAL/PLATELET
Abs Immature Granulocytes: 0.03 10*3/uL (ref 0.00–0.07)
Basophils Absolute: 0 10*3/uL (ref 0.0–0.1)
Basophils Relative: 0 %
Eosinophils Absolute: 0.3 10*3/uL (ref 0.0–0.5)
Eosinophils Relative: 3 %
HCT: 43.1 % (ref 36.0–46.0)
Hemoglobin: 13.6 g/dL (ref 12.0–15.0)
IMMATURE GRANULOCYTES: 0 %
Lymphocytes Relative: 22 %
Lymphs Abs: 2.3 10*3/uL (ref 0.7–4.0)
MCH: 30.8 pg (ref 26.0–34.0)
MCHC: 31.6 g/dL (ref 30.0–36.0)
MCV: 97.5 fL (ref 80.0–100.0)
Monocytes Absolute: 0.6 10*3/uL (ref 0.1–1.0)
Monocytes Relative: 6 %
Neutro Abs: 7.4 10*3/uL (ref 1.7–7.7)
Neutrophils Relative %: 69 %
Platelets: 255 10*3/uL (ref 150–400)
RBC: 4.42 MIL/uL (ref 3.87–5.11)
RDW: 13.2 % (ref 11.5–15.5)
WBC: 10.8 10*3/uL — ABNORMAL HIGH (ref 4.0–10.5)
nRBC: 0 % (ref 0.0–0.2)

## 2018-05-19 LAB — COMPREHENSIVE METABOLIC PANEL
ALT: 18 U/L (ref 0–44)
AST: 18 U/L (ref 15–41)
Albumin: 3.6 g/dL (ref 3.5–5.0)
Alkaline Phosphatase: 60 U/L (ref 38–126)
Anion gap: 7 (ref 5–15)
BUN: 7 mg/dL (ref 6–20)
CO2: 23 mmol/L (ref 22–32)
Calcium: 9.2 mg/dL (ref 8.9–10.3)
Chloride: 110 mmol/L (ref 98–111)
Creatinine, Ser: 0.93 mg/dL (ref 0.44–1.00)
Glucose, Bld: 111 mg/dL — ABNORMAL HIGH (ref 70–99)
Potassium: 3.6 mmol/L (ref 3.5–5.1)
Sodium: 140 mmol/L (ref 135–145)
Total Bilirubin: 0.7 mg/dL (ref 0.3–1.2)
Total Protein: 6.6 g/dL (ref 6.5–8.1)

## 2018-05-19 LAB — URINALYSIS, ROUTINE W REFLEX MICROSCOPIC
Bilirubin Urine: NEGATIVE
Glucose, UA: NEGATIVE mg/dL
Hgb urine dipstick: NEGATIVE
Ketones, ur: NEGATIVE mg/dL
Leukocytes, UA: NEGATIVE
Nitrite: NEGATIVE
PH: 5 (ref 5.0–8.0)
Protein, ur: NEGATIVE mg/dL
Specific Gravity, Urine: 1.023 (ref 1.005–1.030)

## 2018-05-19 LAB — PREGNANCY, URINE: Preg Test, Ur: NEGATIVE

## 2018-05-19 MED ORDER — SODIUM CHLORIDE 0.9 % IV BOLUS
1000.0000 mL | Freq: Once | INTRAVENOUS | Status: AC
  Administered 2018-05-19: 1000 mL via INTRAVENOUS

## 2018-05-19 MED ORDER — ONDANSETRON 4 MG PO TBDP: 4.0000 mg | ORAL_TABLET | Freq: Three times a day (TID) | ORAL | 0 refills | Status: AC | PRN

## 2018-05-19 MED ORDER — ONDANSETRON HCL 4 MG/2ML IJ SOLN
4.0000 mg | Freq: Once | INTRAMUSCULAR | Status: AC
  Administered 2018-05-19: 4 mg via INTRAVENOUS
  Filled 2018-05-19: qty 2

## 2018-05-19 NOTE — ED Triage Notes (Signed)
Pt c.o RLQ abd pain that radiates to lower abd, n.v.d for the past week. Pt denies vomiting today. Pt eating wendys during triage.

## 2018-05-19 NOTE — ED Provider Notes (Signed)
[facility] EMERGENCY DEPARTMENT Provider Note   CSN: [id number] Arrival date & time: 05/19/18  1354     History   Chief Complaint No chief complaint on file.   HPI Cheyenne Walton is a 27 y.o. female.  HPI Patient presents with lower abdominal pain.  Has had nausea vomiting diarrhea over the last week.  Pain is dull at times in the lower abdomen.  Has had some chills without frank fevers.  Slight vaginal discharge.  States she is feeling a little better was eating [employer] because she was hungry.  Has not been very hungry over the last week.  States she is had both diarrhea and constipation.  Pain is dull.  Gets little worse after eating.  Patient says there is a chance she could be pregnant. Past Medical History:  Diagnosis Date  . ASCUS with positive high risk HPV cervical pap smear 02/21/2015   Repeat cotesting in one year as per ASCCP guidelines given age <58  . Asthma    "haven't used an inhaler in years"  . Chlamydia   . Ectopic pregnancy   . Obesity   . Right fallopian tube ectopic pregnancy s/p salpingostomy 03/28/2016  . UTI (urinary tract infection)     Patient Active Problem List   Diagnosis Date Noted  . Right fallopian tube ectopic pregnancy s/p salpingostomy 03/28/2016  . 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 ECTOPIC PREGNANCY SURGERY    . LAPAROSCOPIC UNILATERAL SALPINGECTOMY     LEFT SIDE with ectopic pregnancy  . LAPAROSCOPY  09/13/2011   Procedure: LAPAROSCOPY OPERATIVE;  Surgeon: Tereso Newcomer, MD;  Location: [facility] ORS;  Service: Gynecology;  Laterality: Left;  operative laparoscopy left salpingectomy with removal of ectopic pregnancy.  Marland Kitchen LAPAROSCOPY N/A 03/28/2016   Procedure: LAPAROSCOPY OPERATIVE With Right Salpingostomy and Removal of Right Ectopic Pregnancy;  Surgeon: Tereso Newcomer, MD;  Location: [facility] ORS;  Service: Gynecology;  Laterality: N/A;       OB History    Gravida  7   Para  1   Term  1   Preterm  0   AB  5   Living  1     SAB  4   TAB  0   Ectopic  1   Multiple  0   Live Births  1            Home Medications    Prior to Admission medications   Medication Sig Start Date End Date Taking? Authorizing Provider  cyclobenzaprine (FLEXERIL) 10 MG tablet Take 1 tablet (10 mg total) by mouth 2 (two) times daily as needed for muscle spasms. Patient not taking: Reported on 05/19/2018 11/07/17   Jaynie Crumble, PA-C  docusate sodium (COLACE) 100 MG capsule Take 1 capsule (100 mg total) by mouth 2 (two) times daily as needed. Patient not taking: Reported on 05/19/2018 03/28/16   Tereso Newcomer, MD  ibuprofen (ADVIL,MOTRIN) 600 MG tablet Take 1 tablet (600 mg total) by mouth every 6 (six) hours as needed. Patient not taking: Reported on 05/19/2018 03/28/16   Anyanwu, Jethro Bastos, MD  ondansetron (ZOFRAN-ODT) 4 MG disintegrating tablet Take 1 tablet (4 mg total) by mouth every 8 (eight) hours as needed for nausea or vomiting. 05/19/18   Benjiman Core, MD  oxyCODONE-acetaminophen (PERCOCET/ROXICET) 5-325 MG tablet Take 1-2 tablets by mouth every 6 (six) hours as needed. Patient not taking:  Reported on 03/04/2017 03/28/16   Tereso NewcomerAnyanwu, Ugonna A, MD    Family History Family History  Problem Relation Age of Onset  . Cancer Maternal Grandmother   . Anesthesia problems Neg Hx   . Hypotension Neg Hx   . Malignant hyperthermia Neg Hx   . Pseudochol deficiency Neg Hx     Social History Social History   Tobacco Use  . Smoking status: Current Every Day Smoker    Packs/day: 0.25    Last attempt to quit: 04/08/2012    Years since quitting: 6.1  . Smokeless tobacco: Never Used  Substance Use Topics  . Alcohol use: Yes    Comment: yesterday  . Drug use: Yes    Types: Marijuana    Comment: 2-3 days ago     Allergies   Contrast media [iodinated diagnostic agents]; Fish allergy; Shellfish allergy; and  Latex   Review of Systems Review of Systems  Constitutional: Positive for appetite change.  HENT: Negative for congestion.   Respiratory: Negative for shortness of breath.   Gastrointestinal: Positive for abdominal pain, diarrhea, nausea and vomiting.  Genitourinary: Negative for flank pain and vaginal pain.       Slight "buttery" vaginal discharge.  Musculoskeletal: Negative for back pain.  Neurological: Negative for weakness.  Psychiatric/Behavioral: Negative for confusion.     Physical Exam Updated Vital Signs BP 118/81   Pulse 70   Temp 99.6 F (37.6 C) (Oral)   Resp 16   Ht 5\' 4"  (1.626 m)   Wt 83.9 kg   SpO2 100%   BMI 31.76 kg/m   Physical Exam  Constitutional: She appears well-developed.  HENT:  Head: Atraumatic.  Neck: Neck supple.  Cardiovascular: Normal rate.  Pulmonary/Chest: Effort normal.  Abdominal: Soft. There is no tenderness.  Genitourinary:  Genitourinary Comments: No CVA tenderness.  Musculoskeletal: She exhibits no tenderness.  Neurological: She is alert.  Skin: Skin is warm. Capillary refill takes less than 2 seconds.     ED Treatments / Results  Labs (all labs ordered are listed, but only abnormal results are displayed) Labs Reviewed  CBC WITH DIFFERENTIAL/PLATELET - Abnormal; Notable for the following components:      Result Value   WBC 10.8 (*)    All other components within normal limits  COMPREHENSIVE METABOLIC PANEL - Abnormal; Notable for the following components:   Glucose, Bld 111 (*)    All other components within normal limits  PREGNANCY, URINE  URINALYSIS, ROUTINE W REFLEX MICROSCOPIC    EKG None  Radiology No results found.  Procedures Procedures (including critical care time)  Medications Ordered in ED Medications  sodium chloride 0.9 % bolus 1,000 mL (1,000 mLs Intravenous New Bag/Given 05/19/18 1515)  ondansetron (ZOFRAN) injection 4 mg (4 mg Intravenous Given 05/19/18 1515)     Initial Impression /  Assessment and Plan / ED Course  I have reviewed the triage vital signs and the nursing notes.  Pertinent labs & imaging results that were available during my care of the patient were reviewed by me and considered in my medical decision making (see chart for details).     Patient mild abdominal pain.  Nausea vomiting diarrhea.  Tolerating orals here.  Lab work reassuring.  Not pregnant.  Discharge home with nausea medication.  Doubt severe intra-abdominal pathology at this time.  Doubt this is pelvic related also.  Final Clinical Impressions(s) / ED Diagnoses   Final diagnoses:  Nausea vomiting and diarrhea  Abdominal pain, unspecified abdominal location  ED Discharge Orders         Ordered    ondansetron (ZOFRAN-ODT) 4 MG disintegrating tablet  Every 8 hours PRN     05/19/18 1606           Benjiman Core, MD 05/19/18 1607

## 2018-08-06 ENCOUNTER — Emergency Department (HOSPITAL_COMMUNITY): Payer: Self-pay

## 2018-08-06 ENCOUNTER — Emergency Department (HOSPITAL_COMMUNITY)
Admission: EM | Admit: 2018-08-06 | Discharge: 2018-08-06 | Disposition: A | Discharge status: Home / Self Care | Payer: Self-pay | Attending: Emergency Medicine | Admitting: Emergency Medicine

## 2018-08-06 DIAGNOSIS — J45909 Unspecified asthma, uncomplicated: Secondary | ICD-10-CM | POA: Insufficient documentation

## 2018-08-06 DIAGNOSIS — S0083XA Contusion of other part of head, initial encounter: Secondary | ICD-10-CM | POA: Insufficient documentation

## 2018-08-06 DIAGNOSIS — T07XXXA Unspecified multiple injuries, initial encounter: Secondary | ICD-10-CM | POA: Insufficient documentation

## 2018-08-06 DIAGNOSIS — Y929 Unspecified place or not applicable: Secondary | ICD-10-CM | POA: Insufficient documentation

## 2018-08-06 DIAGNOSIS — S060X0A Concussion without loss of consciousness, initial encounter: Secondary | ICD-10-CM | POA: Insufficient documentation

## 2018-08-06 DIAGNOSIS — Z23 Encounter for immunization: Secondary | ICD-10-CM | POA: Insufficient documentation

## 2018-08-06 DIAGNOSIS — S60221A Contusion of right hand, initial encounter: Secondary | ICD-10-CM | POA: Insufficient documentation

## 2018-08-06 DIAGNOSIS — F17210 Nicotine dependence, cigarettes, uncomplicated: Secondary | ICD-10-CM | POA: Insufficient documentation

## 2018-08-06 DIAGNOSIS — Y9389 Activity, other specified: Secondary | ICD-10-CM | POA: Insufficient documentation

## 2018-08-06 DIAGNOSIS — Y999 Unspecified external cause status: Secondary | ICD-10-CM | POA: Insufficient documentation

## 2018-08-06 LAB — CBC WITH DIFFERENTIAL/PLATELET
Abs Immature Granulocytes: 0.09 10*3/uL — ABNORMAL HIGH (ref 0.00–0.07)
Basophils Absolute: 0 10*3/uL (ref 0.0–0.1)
Basophils Relative: 0 %
Eosinophils Absolute: 0.2 10*3/uL (ref 0.0–0.5)
Eosinophils Relative: 1 %
HCT: 43 % (ref 36.0–46.0)
Hemoglobin: 13.9 g/dL (ref 12.0–15.0)
Immature Granulocytes: 1 %
Lymphocytes Relative: 13 %
Lymphs Abs: 2.4 10*3/uL (ref 0.7–4.0)
MCH: 32.1 pg (ref 26.0–34.0)
MCHC: 32.3 g/dL (ref 30.0–36.0)
MCV: 99.3 fL (ref 80.0–100.0)
MONOS PCT: 5 %
Monocytes Absolute: 1 10*3/uL (ref 0.1–1.0)
Neutro Abs: 14.2 10*3/uL — ABNORMAL HIGH (ref 1.7–7.7)
Neutrophils Relative %: 80 %
Platelets: 281 10*3/uL (ref 150–400)
RBC: 4.33 MIL/uL (ref 3.87–5.11)
RDW: 13.7 % (ref 11.5–15.5)
WBC: 17.8 10*3/uL — ABNORMAL HIGH (ref 4.0–10.5)
nRBC: 0 % (ref 0.0–0.2)

## 2018-08-06 LAB — URINALYSIS, ROUTINE W REFLEX MICROSCOPIC
Bilirubin Urine: NEGATIVE
GLUCOSE, UA: NEGATIVE mg/dL
Hgb urine dipstick: NEGATIVE
Ketones, ur: NEGATIVE mg/dL
Leukocytes,Ua: NEGATIVE
Nitrite: NEGATIVE
PROTEIN: NEGATIVE mg/dL
Specific Gravity, Urine: 1.009 (ref 1.005–1.030)
pH: 5 (ref 5.0–8.0)

## 2018-08-06 LAB — BASIC METABOLIC PANEL
Anion gap: 10 (ref 5–15)
BUN: <5 mg/dL — ABNORMAL LOW (ref 6–20)
CO2: 20 mmol/L — ABNORMAL LOW (ref 22–32)
Calcium: 8.3 mg/dL — ABNORMAL LOW (ref 8.9–10.3)
Chloride: 112 mmol/L — ABNORMAL HIGH (ref 98–111)
Creatinine, Ser: 0.83 mg/dL (ref 0.44–1.00)
GFR calc Af Amer: >60 mL/min (ref >60)
GFR calc non Af Amer: >60 mL/min (ref >60)
Glucose, Bld: 78 mg/dL (ref 70–99)
Potassium: 3.4 mmol/L — ABNORMAL LOW (ref 3.5–5.1)
Sodium: 142 mmol/L (ref 135–145)

## 2018-08-06 LAB — I-STAT BETA HCG BLOOD, ED (MC, WL, AP ONLY)

## 2018-08-06 MED ORDER — MORPHINE SULFATE (PF) 4 MG/ML IV SOLN
4.0000 mg | Freq: Once | INTRAVENOUS | Status: AC
  Administered 2018-08-06: 4 mg via INTRAVENOUS
  Filled 2018-08-06: qty 1

## 2018-08-06 MED ORDER — TETANUS-DIPHTH-ACELL PERTUSSIS 5-2.5-18.5 LF-MCG/0.5 IM SUSP
0.5000 mL | Freq: Once | INTRAMUSCULAR | Status: AC
  Administered 2018-08-06: 0.5 mL via INTRAMUSCULAR
  Filled 2018-08-06: qty 0.5

## 2018-08-06 MED ORDER — ACETAMINOPHEN 500 MG PO TABS
1000.0000 mg | ORAL_TABLET | Freq: Once | ORAL | Status: AC
  Administered 2018-08-06: 1000 mg via ORAL
  Filled 2018-08-06: qty 2

## 2018-08-06 NOTE — ED Notes (Signed)
Patient transported to X-ray 
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
## 2018-08-06 NOTE — ED Notes (Signed)
Patient transported to CT 

## 2018-08-06 NOTE — Discharge Instructions (Signed)
Your xrays and CTs did not show any fractures in your head/face/neck or right hand. You have bruises to all of these areas. Alternate between Ibuprofen and Tylenol for pain. Get plenty of rest, use ice on your head/face and to all areas of soreness, no more than 20 minutes per hour.  Stay in a quiet, not simulating, dark environment. No TV, computer use, video games, or cell phone use until headache is resolved completely. Keep abrasions covered with topical antibiotic ointment and a bandage. Follow Up with primary care physician in 5-7 days for recheck of symptoms.  Return to the emergency department if patient becomes lethargic, begins vomiting or other change in mental status, or any other changes/worsening symptoms.
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
## 2018-12-24 ENCOUNTER — Emergency Department (HOSPITAL_COMMUNITY)
Admission: EM | Admit: 2018-12-24 | Discharge: 2018-12-24 | Disposition: A | Discharge status: Home / Self Care | Payer: Self-pay | Attending: Emergency Medicine | Admitting: Emergency Medicine

## 2018-12-24 ENCOUNTER — Encounter (HOSPITAL_COMMUNITY): Payer: Self-pay

## 2018-12-24 ENCOUNTER — Emergency Department (HOSPITAL_COMMUNITY): Payer: Self-pay

## 2018-12-24 ENCOUNTER — Other Ambulatory Visit: Payer: Self-pay

## 2018-12-24 DIAGNOSIS — F17200 Nicotine dependence, unspecified, uncomplicated: Secondary | ICD-10-CM | POA: Insufficient documentation

## 2018-12-24 DIAGNOSIS — N730 Acute parametritis and pelvic cellulitis: Secondary | ICD-10-CM

## 2018-12-24 DIAGNOSIS — J45909 Unspecified asthma, uncomplicated: Secondary | ICD-10-CM | POA: Insufficient documentation

## 2018-12-24 DIAGNOSIS — Z91040 Latex allergy status: Secondary | ICD-10-CM | POA: Insufficient documentation

## 2018-12-24 DIAGNOSIS — N739 Female pelvic inflammatory disease, unspecified: Secondary | ICD-10-CM | POA: Insufficient documentation

## 2018-12-24 LAB — COMPREHENSIVE METABOLIC PANEL
ALT: 15 U/L (ref 0–44)
AST: 20 U/L (ref 15–41)
Albumin: 3.9 g/dL (ref 3.5–5.0)
Alkaline Phosphatase: 75 U/L (ref 38–126)
Anion gap: 9 (ref 5–15)
BUN: 8 mg/dL (ref 6–20)
CO2: 23 mmol/L (ref 22–32)
Calcium: 9 mg/dL (ref 8.9–10.3)
Chloride: 108 mmol/L (ref 98–111)
Creatinine, Ser: 0.88 mg/dL (ref 0.44–1.00)
GFR calc Af Amer: >60 mL/min (ref >60)
GFR calc non Af Amer: >60 mL/min (ref >60)
Glucose, Bld: 111 mg/dL — ABNORMAL HIGH (ref 70–99)
Potassium: 3.9 mmol/L (ref 3.5–5.1)
Sodium: 140 mmol/L (ref 135–145)
Total Bilirubin: 0.4 mg/dL (ref 0.3–1.2)
Total Protein: 7.1 g/dL (ref 6.5–8.1)

## 2018-12-24 LAB — CBC
HCT: 43.2 % (ref 36.0–46.0)
Hemoglobin: 14.2 g/dL (ref 12.0–15.0)
MCH: 32.3 pg (ref 26.0–34.0)
MCHC: 32.9 g/dL (ref 30.0–36.0)
MCV: 98.4 fL (ref 80.0–100.0)
Platelets: 288 10*3/uL (ref 150–400)
RBC: 4.39 MIL/uL (ref 3.87–5.11)
RDW: 13.6 % (ref 11.5–15.5)
WBC: 11.2 10*3/uL — ABNORMAL HIGH (ref 4.0–10.5)
nRBC: 0 % (ref 0.0–0.2)

## 2018-12-24 LAB — URINALYSIS, ROUTINE W REFLEX MICROSCOPIC
Bilirubin Urine: NEGATIVE
Glucose, UA: NEGATIVE mg/dL
Hgb urine dipstick: NEGATIVE
Ketones, ur: NEGATIVE mg/dL
Leukocytes,Ua: NEGATIVE
Nitrite: NEGATIVE
Protein, ur: NEGATIVE mg/dL
Specific Gravity, Urine: 1.021 (ref 1.005–1.030)
pH: 7 (ref 5.0–8.0)

## 2018-12-24 LAB — I-STAT BETA HCG BLOOD, ED (MC, WL, AP ONLY): I-stat hCG, quantitative: <5 m[IU]/mL (ref <5)

## 2018-12-24 LAB — WET PREP, GENITAL
Sperm: NONE SEEN
Trich, Wet Prep: NONE SEEN
WBC, Wet Prep HPF POC: NONE SEEN
Yeast Wet Prep HPF POC: NONE SEEN

## 2018-12-24 LAB — LIPASE, BLOOD: Lipase: 33 U/L (ref 11–51)

## 2018-12-24 MED ORDER — IBUPROFEN 200 MG PO TABS
600.0000 mg | ORAL_TABLET | Freq: Once | ORAL | Status: AC
  Administered 2018-12-24: 600 mg via ORAL
  Filled 2018-12-24: qty 3

## 2018-12-24 MED ORDER — OXYCODONE-ACETAMINOPHEN 5-325 MG PO TABS
1.0000 | ORAL_TABLET | Freq: Once | ORAL | Status: AC
  Administered 2018-12-24: 21:00:00 1 via ORAL
  Filled 2018-12-24: qty 1

## 2018-12-24 MED ORDER — SODIUM CHLORIDE 0.9 % IV BOLUS
1000.0000 mL | Freq: Once | INTRAVENOUS | Status: AC
  Administered 2018-12-24: 1000 mL via INTRAVENOUS

## 2018-12-24 MED ORDER — LIDOCAINE HCL 2 % IJ SOLN
INTRAMUSCULAR | Status: AC
  Administered 2018-12-24: 400 mg
  Filled 2018-12-24: qty 20

## 2018-12-24 MED ORDER — CEFTRIAXONE SODIUM 250 MG IJ SOLR
250.0000 mg | Freq: Once | INTRAMUSCULAR | Status: AC
  Administered 2018-12-24: 250 mg via INTRAMUSCULAR
  Filled 2018-12-24: qty 250

## 2018-12-24 MED ORDER — DOXYCYCLINE HYCLATE 100 MG PO CAPS: 100.0000 mg | ORAL_CAPSULE | Freq: Two times a day (BID) | ORAL | 0 refills | Status: AC

## 2018-12-24 MED ORDER — AZITHROMYCIN 1 G PO PACK
1.0000 g | PACK | Freq: Once | ORAL | Status: AC
  Administered 2018-12-24: 1 g via ORAL
  Filled 2018-12-24: qty 1

## 2018-12-24 NOTE — ED Notes (Signed)
Patient transported to CT 

## 2018-12-24 NOTE — ED Provider Notes (Signed)
[facility]-EMERGENCY DEPT Provider Note   CSN: [id number] Arrival date & time: 12/24/18  1946     History   Chief Complaint Chief Complaint  Patient presents with   Abdominal Pain    HPI Cheyenne Walton is a 28 y.o. female.     HPI   28 year old female with abdominal pain.  Onset 2 days ago.  Pain is right lower quadrant.  Waxes and wanes.  Describes as sharp.  She has not noticed any appreciable exacerbating relieving factors.  No fevers or chills.  Mild nausea.  She is concerned she may potentially be pregnant.  She is sexually active.  Some vaginal discharge.  No urinary complaints.  Past Medical History:  Diagnosis Date   ASCUS with positive high risk HPV cervical pap smear 02/21/2015   Repeat cotesting in one year as per ASCCP guidelines given age <25   Asthma    "haven't used an inhaler in years"   Chlamydia    Ectopic pregnancy    Obesity    Right fallopian tube ectopic pregnancy s/p salpingostomy 03/28/2016   UTI (urinary tract infection)     Patient Active Problem List   Diagnosis Date Noted   Right fallopian tube ectopic pregnancy s/p salpingostomy 03/28/2016   ASCUS with positive high risk HPV cervical pap smear 02/21/2015    Past Surgical History:  Procedure Laterality Date   ECTOPIC PREGNANCY SURGERY     LAPAROSCOPIC UNILATERAL SALPINGECTOMY     LEFT SIDE with ectopic pregnancy   LAPAROSCOPY  09/13/2011   Procedure: LAPAROSCOPY OPERATIVE;  Surgeon: Tereso NewcomerUgonna A Anyanwu, MD;  Location: [facility];  Service: Gynecology;  Laterality: Left;  operative laparoscopy left salpingectomy with removal of ectopic pregnancy.   LAPAROSCOPY N/A 03/28/2016   Procedure: LAPAROSCOPY OPERATIVE With Right Salpingostomy and Removal of Right Ectopic Pregnancy;  Surgeon: Tereso NewcomerUgonna A Anyanwu, MD;  Location: [facility];  Service: Gynecology;  Laterality: N/A;     OB History    Gravida  7   Para  1   Term  1   Preterm  0   AB  5   Living  1       SAB  4   TAB  0   Ectopic  1   Multiple  0   Live Births  1            Home Medications    Prior to Admission medications   Medication Sig Start Date End Date Taking? Authorizing Provider  cyclobenzaprine (FLEXERIL) 10 MG tablet Take 1 tablet (10 mg total) by mouth 2 (two) times daily as needed for muscle spasms. Patient not taking: Reported on 05/19/2018 11/07/17   Jaynie CrumbleKirichenko, Tatyana, PA-C  docusate sodium (COLACE) 100 MG capsule Take 1 capsule (100 mg total) by mouth 2 (two) times daily as needed. Patient not taking: Reported on 05/19/2018 03/28/16   Tereso NewcomerAnyanwu, Ugonna A, MD  ibuprofen (ADVIL,MOTRIN) 600 MG tablet Take 1 tablet (600 mg total) by mouth every 6 (six) hours as needed. Patient not taking: Reported on 05/19/2018 03/28/16   Anyanwu, Jethro BastosUgonna A, MD  ondansetron (ZOFRAN-ODT) 4 MG disintegrating tablet Take 1 tablet (4 mg total) by mouth every 8 (eight) hours as needed for nausea or vomiting. Patient not taking: Reported on 08/06/2018 05/19/18   Benjiman CorePickering, Nathan, MD  oxyCODONE-acetaminophen (PERCOCET/ROXICET) 5-325 MG tablet Take 1-2 tablets by mouth every 6 (six) hours as needed. Patient not taking: Reported on 08/06/2018 03/28/16   Tereso NewcomerAnyanwu, Ugonna A, MD  Family History Family History  Problem Relation Age of Onset   Cancer Maternal Grandmother    Anesthesia problems Neg Hx    Hypotension Neg Hx    Malignant hyperthermia Neg Hx    Pseudochol deficiency Neg Hx     Social History Social History   Tobacco Use   Smoking status: Current Every Day Smoker    Packs/day: 0.25    Last attempt to quit: 04/08/2012    Years since quitting: 6.7   Smokeless tobacco: Never Used  Substance Use Topics   Alcohol use: Yes    Comment: yesterday   Drug use: Yes    Types: Marijuana    Comment: 2-3 days ago     Allergies   Contrast media [iodinated diagnostic agents], Fish allergy, Shellfish allergy, and Latex   Review of Systems Review of Systems  All  systems reviewed and negative, other than as noted in HPI.  Physical Exam Updated Vital Signs BP 122/87 (BP Location: Right Arm)    Pulse 75    Temp 98.9 F (37.2 C) (Oral)    Resp 18    Ht 5\' 4"  (1.626 m)    Wt 87.5 kg    SpO2 97%    BMI 33.13 kg/m   Physical Exam Vitals signs and nursing note reviewed.  Constitutional:      General: She is not in acute distress.    Appearance: She is well-developed.  HENT:     Head: Normocephalic and atraumatic.  Eyes:     General:        Right eye: No discharge.        Left eye: No discharge.     Conjunctiva/sclera: Conjunctivae normal.  Neck:     Musculoskeletal: Neck supple.  Cardiovascular:     Rate and Rhythm: Normal rate and regular rhythm.     Heart sounds: Normal heart sounds. No murmur. No friction rub. No gallop.   Pulmonary:     Effort: Pulmonary effort is normal. No respiratory distress.     Breath sounds: Normal breath sounds.  Abdominal:     General: There is no distension.     Palpations: Abdomen is soft.     Tenderness: There is abdominal tenderness.     Comments: Mild tenderness to palpation in the right lower quadrant without rebound or guarding.  No distention.  No CVA tenderness.  Genitourinary:    Comments: Chaperone present.  Normal external female genitalia.  No concerning lesions noted.  Of whitish vaginal discharge.  CMT. Musculoskeletal:        General: No tenderness.  Skin:    General: Skin is warm and dry.  Neurological:     Mental Status: She is alert.  Psychiatric:        Behavior: Behavior normal.        Thought Content: Thought content normal.      ED Treatments / Results  Labs (all labs ordered are listed, but only abnormal results are displayed) Labs Reviewed  WET PREP, GENITAL - Abnormal; Notable for the following components:      Result Value   Clue Cells Wet Prep HPF POC PRESENT (*)    All other components within normal limits  COMPREHENSIVE METABOLIC PANEL - Abnormal; Notable for the  following components:   Glucose, Bld 111 (*)    All other components within normal limits  CBC - Abnormal; Notable for the following components:   WBC 11.2 (*)    All other components within normal limits  URINALYSIS, ROUTINE W REFLEX MICROSCOPIC - Abnormal; Notable for the following components:   APPearance HAZY (*)    All other components within normal limits  LIPASE, BLOOD  I-STAT BETA HCG BLOOD, ED ([facility], [facility], [facility] ONLY)  GC/CHLAMYDIA PROBE AMP ([facility]) NOT AT [facility]    EKG None  Radiology Ct Abdomen Pelvis Wo Contrast  Result Date: 12/24/2018 CLINICAL DATA:  Right lower quadrant pain for 2 days. Episode of vomiting. EXAM: CT ABDOMEN AND PELVIS WITHOUT CONTRAST TECHNIQUE: Multidetector CT imaging of the abdomen and pelvis was performed following the standard protocol without IV contrast. COMPARISON:  None. FINDINGS: Lower chest: The lung bases are clear. No consolidation or pleural fluid. Hepatobiliary: Focal fatty infiltration adjacent to the falciform ligament. No other focal hepatic abnormality on noncontrast exam. Gallbladder physiologically distended, no calcified stone. No biliary dilatation. Pancreas: No ductal dilatation or inflammation. Spleen: Normal in size without focal abnormality. Adrenals/Urinary Tract: Normal adrenal glands. Kidneys are symmetric in size without stones or hydronephrosis. There is no perinephric stranding. Both ureters are decompressed without stones along the course. Urinary bladder is nondistended. No bladder wall thickening. Stomach/Bowel: Detailed bowel evaluation is limited in the absence of enteric contrast. Normal air-filled appendix, for example image 57 series 2. Terminal ileum is normal. No bowel wall thickening or inflammatory change. No obstruction. Ingested material within the stomach. Small to moderate volume of stool throughout the colon. Vascular/Lymphatic: Abdominal aorta is normal caliber. No bulky abdominopelvic adenopathy. Reproductive:  Anteverted uterus. Ovaries symmetric in size. No evidence of adnexal mass. Other: No free air or free fluid. Musculoskeletal: There are no acute or suspicious osseous abnormalities. IMPRESSION: No acute abnormality in the abdomen/pelvis. Particularly, normal appendix. Electronically Signed   By: Narda RutherfordMelanie  Sanford M.D.   On: 12/24/2018 21:20    Procedures Procedures (including critical care time)  Medications Ordered in ED Medications  oxyCODONE-acetaminophen (PERCOCET/ROXICET) 5-325 MG per tablet 1 tablet (has no administration in time range)  ibuprofen (ADVIL) tablet 600 mg (has no administration in time range)  sodium chloride 0.9 % bolus 1,000 mL (has no administration in time range)     Initial Impression / Assessment and Plan / ED Course  I have reviewed the triage vital signs and the nursing notes.  Pertinent labs & imaging results that were available during my care of the patient were reviewed by me and considered in my medical decision making (see chart for details).  28 year old female with right lower quadrant abdominal pain.  Fairly mild tenderness on exam.  She is not pregnant.  She is actually had a bilateral salpingectomies.  Will CT to rule out appendicitis.  UA looks fine.  Pelvic exam.  Medicine reassessment.  Pretty good amount of discharge on pelvic exam.  CMT.  Will treat for possible PID.   Final Clinical Impressions(s) / ED Diagnoses   Final diagnoses:  PID (acute pelvic inflammatory disease)    ED Discharge Orders    None       Raeford RazorKohut, Elia Nunley, MD 12/25/18 1131

## 2018-12-24 NOTE — ED Triage Notes (Signed)
Pt presents to the ED complaining of RLQ abdominal pain x2 days. Denies diarrhea. States that she vomited once yesterday. Unsure of last period and would like a pregnancy test. Pt tolerating PO chickfila at this time.

## 2018-12-24 NOTE — ED Notes (Signed)
Pelvic Exam set up and ready at bedside 

## 2018-12-24 NOTE — ED Notes (Signed)
Pt stated that her pain is probably at a 5 right. Pt PO chic-fil-a while in room. Patient instructed to not eat or drink until provider states it is ok.

## 2018-12-24 NOTE — ED Notes (Signed)
Pt transported to CT ?

## 2018-12-29 LAB — GC/CHLAMYDIA PROBE AMP (~~LOC~~) NOT AT ARMC
Chlamydia: NEGATIVE
Neisseria Gonorrhea: NEGATIVE

## 2019-01-04 ENCOUNTER — Telehealth: Payer: Self-pay

## 2019-01-04 NOTE — Telephone Encounter (Signed)
Att to contact pt about new patient appt no ans lvm to call ofc

## 2019-10-04 IMAGING — CT CT ABDOMEN AND PELVIS WITHOUT CONTRAST
2 of 4 series · 16 of 46 positions shown, 18 images · non-contrast
Comparison: None.

CLINICAL DATA: Right lower quadrant pain for 2 days. Episode of
vomiting.

EXAM:
CT ABDOMEN AND PELVIS WITHOUT CONTRAST
TECHNIQUE: Multidetector CT imaging of the abdomen and pelvis was performed
following the standard protocol without IV contrast.

[Series 2: axial st · axial · 0.77mm/px · z∈[-465,-75]mm · 13 of 88 slices shown, 15 images]
[im 5/88  soft-tissue]
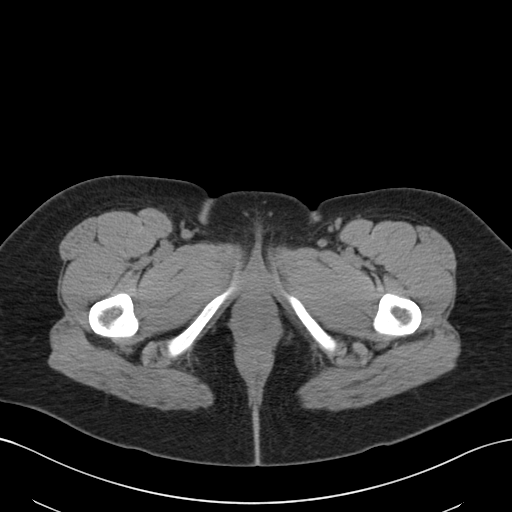
[im 5/88  bone]
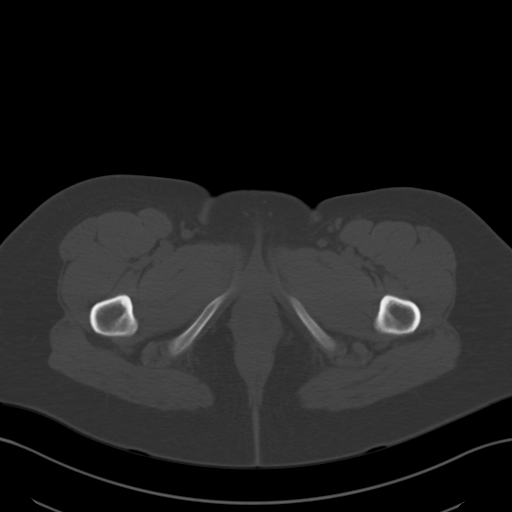
[im 10/88  soft-tissue]
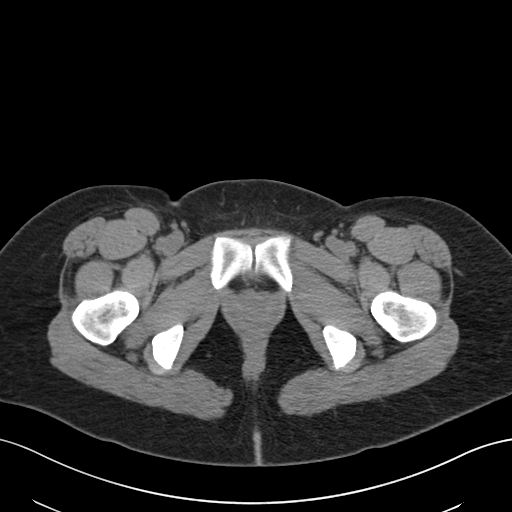
[im 20/88  soft-tissue]
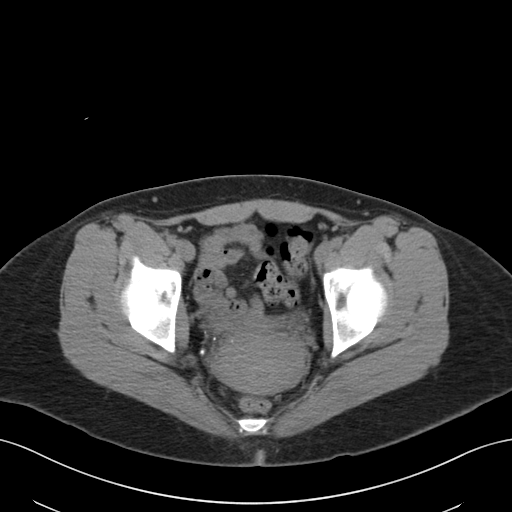
[im 25/88  soft-tissue]
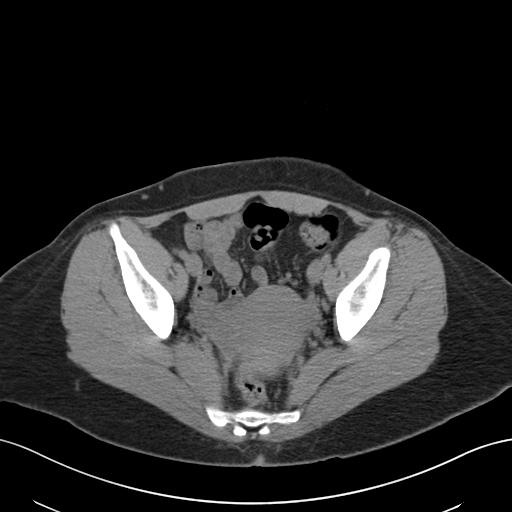
[im 30/88  soft-tissue]
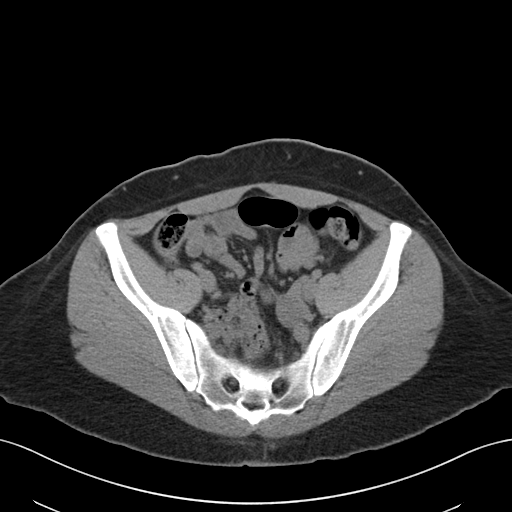
[im 39/88  soft-tissue]
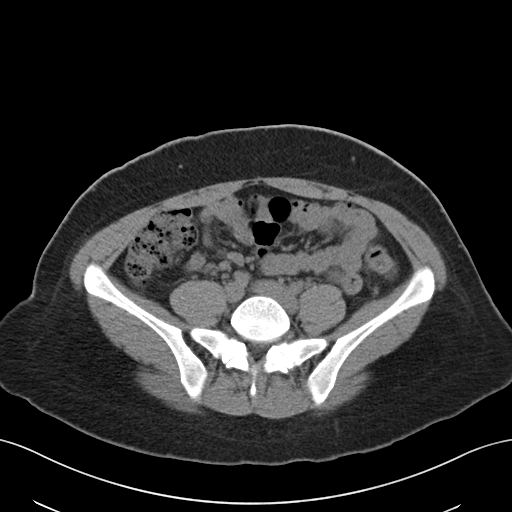
[im 44/88  soft-tissue]
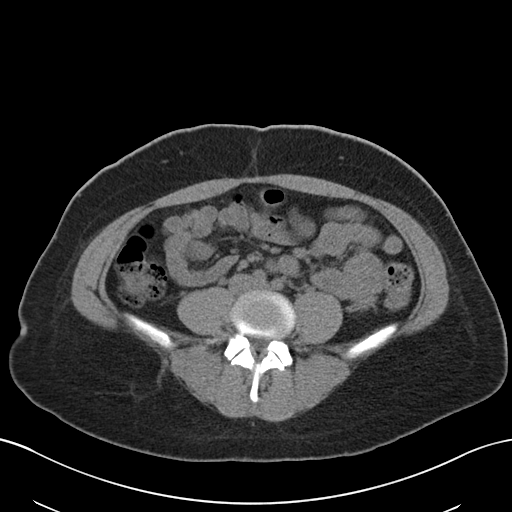
[im 49/88  soft-tissue]
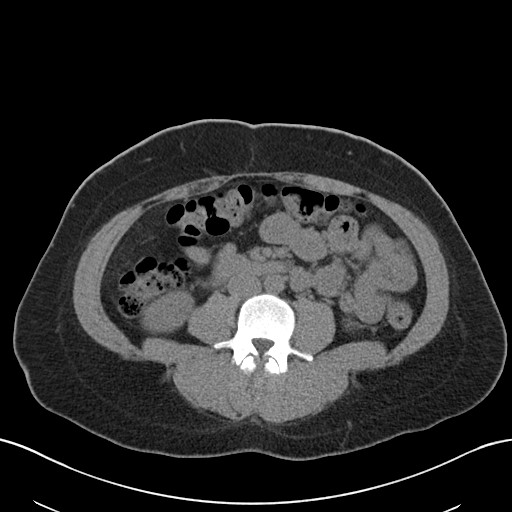
[im 59/88  soft-tissue]
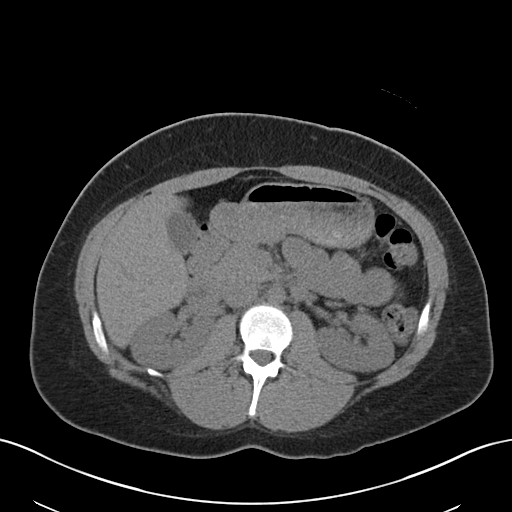
[im 59/88  bone]
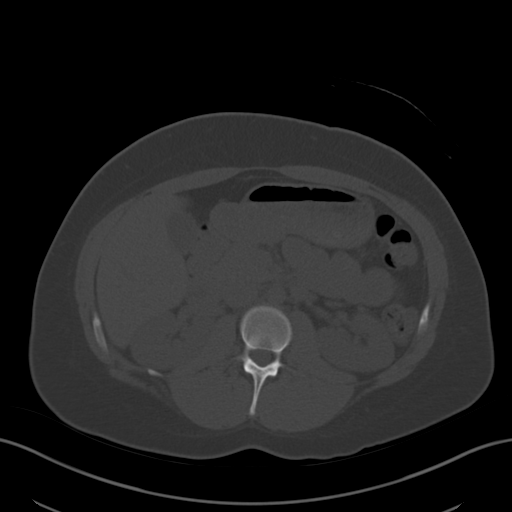
[im 63/88  soft-tissue]
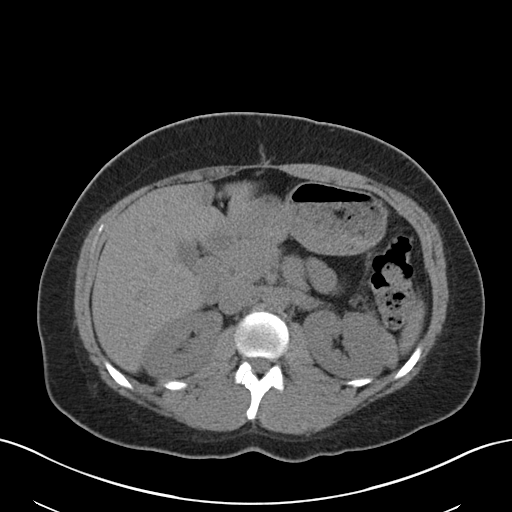
[im 68/88  soft-tissue]
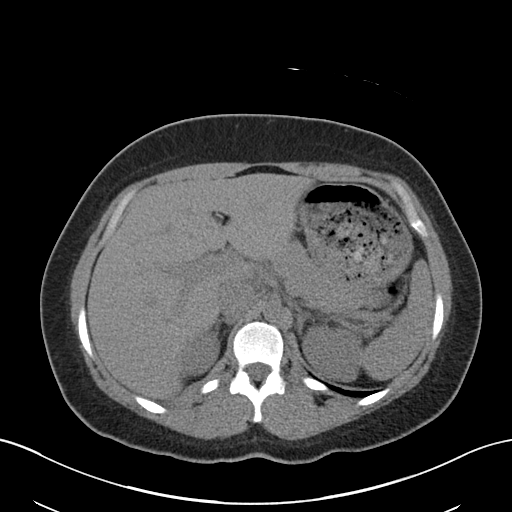
[im 78/88  soft-tissue]
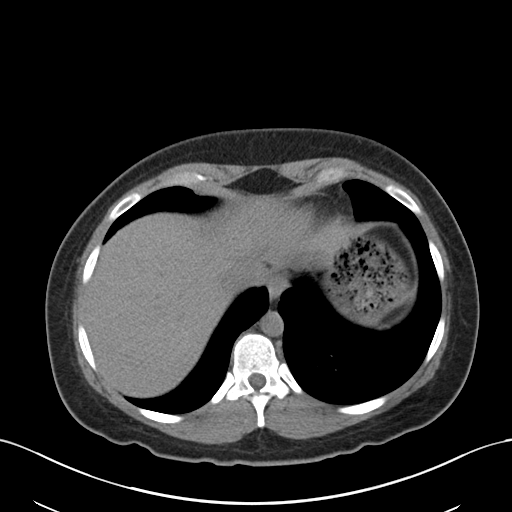
[im 83/88  soft-tissue]
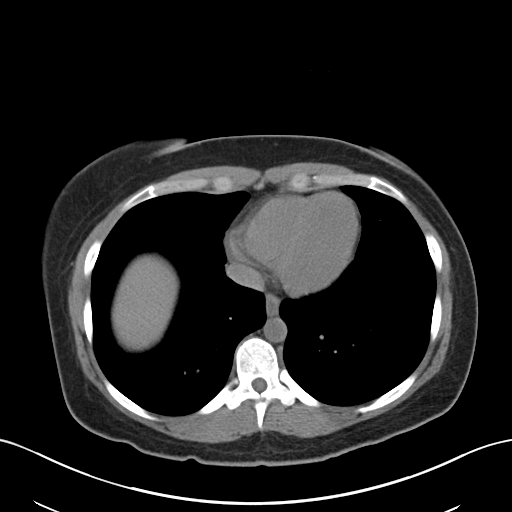

[Series 5: coronal st · coronal · 0.74mm/px · 3 of 148 slices shown]
[im 50/148  soft-tissue]
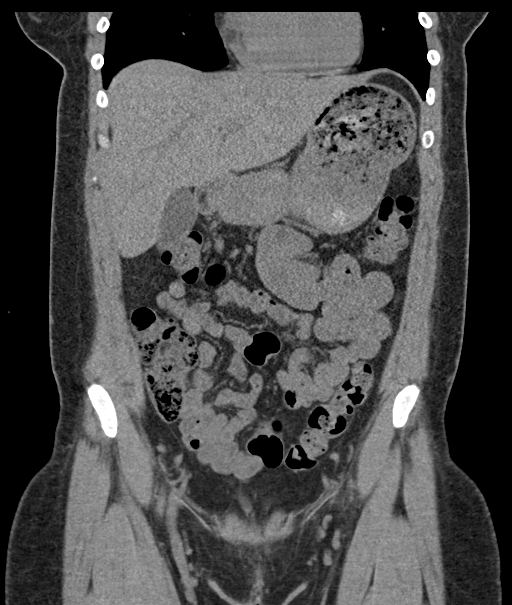
[im 66/148  soft-tissue]
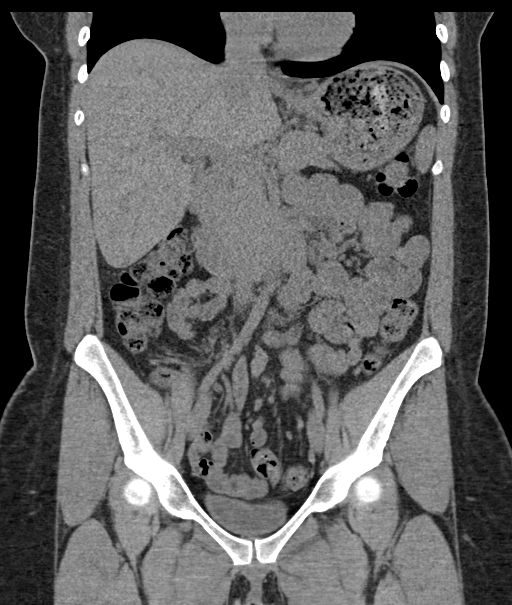
[im 82/148  soft-tissue]
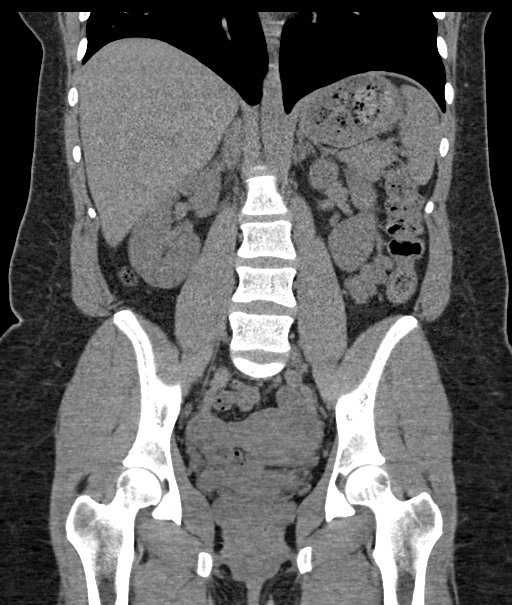

[16 of 46 positions shown; findings below may reference images not displayed]

FINDINGS: Lower chest: The lung bases are clear. No consolidation or pleural
fluid.

Hepatobiliary: Focal fatty infiltration adjacent to the falciform
ligament. No other focal hepatic abnormality on noncontrast exam.
Gallbladder physiologically distended, no calcified stone. No
biliary dilatation.

Pancreas: No ductal dilatation or inflammation.

Spleen: Normal in size without focal abnormality.

Adrenals/Urinary Tract: Normal adrenal glands. Kidneys are symmetric
in size without stones or hydronephrosis. There is no perinephric
stranding. Both ureters are decompressed without stones along the
course. Urinary bladder is nondistended. No bladder wall thickening.

Stomach/Bowel: Detailed bowel evaluation is limited in the absence
of enteric contrast. Normal air-filled appendix, for example image
57 series 2. Terminal ileum is normal. No bowel wall thickening or
inflammatory change. No obstruction. Ingested material within the
stomach. Small to moderate volume of stool throughout the colon.

Vascular/Lymphatic: Abdominal aorta is normal caliber. No bulky
abdominopelvic adenopathy.

Reproductive: Anteverted uterus. Ovaries symmetric in size. No
evidence of adnexal mass.

Other: No free air or free fluid.

Musculoskeletal: There are no acute or suspicious osseous
abnormalities.
IMPRESSION: No acute abnormality in the abdomen/pelvis. Particularly, normal
appendix.
